# Patient Record
Sex: Female | Born: 1960 | Race: Black or African American | Hispanic: No | Marital: Single | Smoking: Current some day smoker
Health system: Southern US, Community
[De-identification: ages and names within clinical notes are randomized; demographics above are authoritative.]

## PROBLEM LIST (undated history)

## (undated) DIAGNOSIS — F250 Schizoaffective disorder, bipolar type: Secondary | ICD-10-CM

## (undated) DIAGNOSIS — F1490 Cocaine use, unspecified, uncomplicated: Secondary | ICD-10-CM

## (undated) DIAGNOSIS — J45909 Unspecified asthma, uncomplicated: Secondary | ICD-10-CM

## (undated) DIAGNOSIS — F1010 Alcohol abuse, uncomplicated: Secondary | ICD-10-CM

## (undated) DIAGNOSIS — E119 Type 2 diabetes mellitus without complications: Secondary | ICD-10-CM

## (undated) DIAGNOSIS — F319 Bipolar disorder, unspecified: Secondary | ICD-10-CM

## (undated) DIAGNOSIS — F1910 Other psychoactive substance abuse, uncomplicated: Secondary | ICD-10-CM

## (undated) DIAGNOSIS — M1990 Unspecified osteoarthritis, unspecified site: Secondary | ICD-10-CM

## (undated) DIAGNOSIS — I2510 Atherosclerotic heart disease of native coronary artery without angina pectoris: Secondary | ICD-10-CM

## (undated) DIAGNOSIS — I10 Essential (primary) hypertension: Secondary | ICD-10-CM

## (undated) HISTORY — PX: OTHER SURGICAL HISTORY: SHX169

## (undated) HISTORY — PX: CORONARY STENT PLACEMENT: SHX1402

---

## 2007-09-04 ENCOUNTER — Ambulatory Visit: Payer: Self-pay | Admitting: Family Medicine

## 2007-09-04 LAB — CONVERTED CEMR LAB: Microalb, Ur: 1.19 mg/dL (ref 0.00–1.89)

## 2008-01-23 ENCOUNTER — Ambulatory Visit: Payer: Self-pay | Admitting: Family Medicine

## 2009-02-08 ENCOUNTER — Emergency Department (HOSPITAL_BASED_OUTPATIENT_CLINIC_OR_DEPARTMENT_OTHER): Admission: EM | Admit: 2009-02-08 | Discharge: 2009-02-08 | Payer: Self-pay | Admitting: Emergency Medicine

## 2009-02-09 ENCOUNTER — Emergency Department (HOSPITAL_COMMUNITY): Admission: EM | Admit: 2009-02-09 | Discharge: 2009-02-09 | Payer: Self-pay | Admitting: Emergency Medicine

## 2009-02-14 ENCOUNTER — Ambulatory Visit: Payer: Self-pay | Admitting: Interventional Radiology

## 2009-02-14 ENCOUNTER — Emergency Department (HOSPITAL_BASED_OUTPATIENT_CLINIC_OR_DEPARTMENT_OTHER): Admission: EM | Admit: 2009-02-14 | Discharge: 2009-02-14 | Payer: Self-pay | Admitting: Emergency Medicine

## 2010-04-11 ENCOUNTER — Emergency Department (HOSPITAL_COMMUNITY): Admission: EM | Admit: 2010-04-11 | Discharge: 2010-04-12 | Payer: Self-pay | Admitting: Emergency Medicine

## 2010-04-15 ENCOUNTER — Emergency Department (HOSPITAL_BASED_OUTPATIENT_CLINIC_OR_DEPARTMENT_OTHER): Admission: EM | Admit: 2010-04-15 | Discharge: 2010-04-15 | Payer: Self-pay | Admitting: Emergency Medicine

## 2010-04-20 ENCOUNTER — Emergency Department (HOSPITAL_BASED_OUTPATIENT_CLINIC_OR_DEPARTMENT_OTHER): Admission: EM | Admit: 2010-04-20 | Discharge: 2010-04-20 | Payer: Self-pay | Admitting: Emergency Medicine

## 2010-05-05 ENCOUNTER — Ambulatory Visit: Payer: Self-pay | Admitting: Psychiatry

## 2010-05-06 ENCOUNTER — Ambulatory Visit: Payer: Self-pay | Admitting: Psychiatry

## 2011-02-13 LAB — DIFFERENTIAL
Basophils Absolute: 0 10*3/uL (ref 0.0–0.1)
Basophils Relative: 1 % (ref 0–1)
Eosinophils Absolute: 0.1 10*3/uL (ref 0.0–0.7)
Eosinophils Relative: 1 % (ref 0–5)
Eosinophils Relative: 1 % (ref 0–5)
Lymphocytes Relative: 40 % (ref 12–46)
Lymphs Abs: 2.9 10*3/uL (ref 0.7–4.0)
Monocytes Absolute: 0.5 10*3/uL (ref 0.1–1.0)
Monocytes Relative: 4 % (ref 3–12)
Neutro Abs: 3.7 10*3/uL (ref 1.7–7.7)
Neutrophils Relative %: 51 % (ref 43–77)

## 2011-02-13 LAB — BASIC METABOLIC PANEL
BUN: 15 mg/dL (ref 6–23)
BUN: 17 mg/dL (ref 6–23)
CO2: 23 mEq/L (ref 19–32)
CO2: 26 mEq/L (ref 19–32)
Calcium: 9.7 mg/dL (ref 8.4–10.5)
Chloride: 102 mEq/L (ref 96–112)
Chloride: 98 mEq/L (ref 96–112)
Creatinine, Ser: 0.8 mg/dL (ref 0.4–1.2)
GFR calc Af Amer: >60 mL/min (ref >60)
GFR calc non Af Amer: >60 mL/min (ref >60)
Glucose, Bld: 372 mg/dL — ABNORMAL HIGH (ref 70–99)
Glucose, Bld: 373 mg/dL — ABNORMAL HIGH (ref 70–99)
Potassium: 4.3 mEq/L (ref 3.5–5.1)
Potassium: 4.3 mEq/L (ref 3.5–5.1)
Sodium: 132 mEq/L — ABNORMAL LOW (ref 135–145)

## 2011-02-13 LAB — CBC
HCT: 36.2 % (ref 36.0–46.0)
Hemoglobin: 11.9 g/dL — ABNORMAL LOW (ref 12.0–15.0)
MCHC: 32.8 g/dL (ref 30.0–36.0)
MCV: 89.7 fL (ref 78.0–100.0)
Platelets: 294 10*3/uL (ref 150–400)
RBC: 4.03 MIL/uL (ref 3.87–5.11)
RDW: 13.1 % (ref 11.5–15.5)
WBC: 7.3 10*3/uL (ref 4.0–10.5)

## 2011-02-13 LAB — GLUCOSE, CAPILLARY
Glucose-Capillary: 270 mg/dL — ABNORMAL HIGH (ref 70–99)
Glucose-Capillary: 338 mg/dL — ABNORMAL HIGH (ref 70–99)
Glucose-Capillary: 348 mg/dL — ABNORMAL HIGH (ref 70–99)
Glucose-Capillary: 192 mg/dL — ABNORMAL HIGH (ref 70–99)
Glucose-Capillary: 266 mg/dL — ABNORMAL HIGH (ref 70–99)
Glucose-Capillary: 305 mg/dL — ABNORMAL HIGH (ref 70–99)
Glucose-Capillary: 314 mg/dL — ABNORMAL HIGH (ref 70–99)
Glucose-Capillary: 475 mg/dL — ABNORMAL HIGH (ref 70–99)

## 2011-02-13 LAB — URINALYSIS, ROUTINE W REFLEX MICROSCOPIC
Bilirubin Urine: NEGATIVE
Glucose, UA: >1000 mg/dL — AB
Glucose, UA: 500 mg/dL — AB
Hgb urine dipstick: NEGATIVE
Ketones, ur: NEGATIVE mg/dL
Ketones, ur: NEGATIVE mg/dL
Leukocytes, UA: NEGATIVE
Nitrite: NEGATIVE
Protein, ur: NEGATIVE mg/dL
Specific Gravity, Urine: 1.03 (ref 1.005–1.030)
pH: 7 (ref 5.0–8.0)

## 2011-02-13 LAB — ACETAMINOPHEN LEVEL: Acetaminophen (Tylenol), Serum: <10 ug/mL — ABNORMAL LOW (ref 10–30)

## 2011-02-13 LAB — URINE MICROSCOPIC-ADD ON

## 2011-02-13 LAB — RAPID URINE DRUG SCREEN, HOSP PERFORMED
Barbiturates: NOT DETECTED
Cocaine: POSITIVE — AB
Opiates: NOT DETECTED

## 2011-03-09 LAB — PREGNANCY, URINE: Preg Test, Ur: NEGATIVE

## 2011-03-09 LAB — POCT CARDIAC MARKERS: Myoglobin, poc: 79 ng/mL (ref 12–200)

## 2011-03-09 LAB — POCT I-STAT, CHEM 8
BUN: 13 mg/dL (ref 6–23)
Calcium, Ion: 1.12 mmol/L (ref 1.12–1.32)
HCT: 41 % (ref 39.0–52.0)
Hemoglobin: 13.9 g/dL (ref 13.0–17.0)
Sodium: 135 mEq/L (ref 135–145)
TCO2: 26 mmol/L (ref 0–100)

## 2011-03-09 LAB — GLUCOSE, CAPILLARY: Glucose-Capillary: 146 mg/dL — ABNORMAL HIGH (ref 70–99)

## 2011-03-09 LAB — DIFFERENTIAL
Eosinophils Absolute: 0.3 10*3/uL (ref 0.0–0.7)
Eosinophils Relative: 2 % (ref 0–5)
Lymphocytes Relative: 38 % (ref 12–46)
Lymphs Abs: 4.6 10*3/uL — ABNORMAL HIGH (ref 0.7–4.0)
Monocytes Absolute: 0.8 10*3/uL (ref 0.1–1.0)
Monocytes Relative: 7 % (ref 3–12)

## 2011-03-09 LAB — BASIC METABOLIC PANEL
BUN: 17 mg/dL (ref 6–23)
Chloride: 100 mEq/L (ref 96–112)
GFR calc non Af Amer: >60 mL/min (ref >60)
Glucose, Bld: 194 mg/dL — ABNORMAL HIGH (ref 70–99)
Potassium: 4.3 mEq/L (ref 3.5–5.1)
Sodium: 135 mEq/L (ref 135–145)

## 2011-03-09 LAB — URINALYSIS, ROUTINE W REFLEX MICROSCOPIC
Bilirubin Urine: NEGATIVE
Ketones, ur: NEGATIVE mg/dL
Nitrite: NEGATIVE
Protein, ur: NEGATIVE mg/dL

## 2011-03-09 LAB — GC/CHLAMYDIA PROBE AMP, GENITAL: GC Probe Amp, Genital: NEGATIVE

## 2011-03-09 LAB — CBC
HCT: 34 % — ABNORMAL LOW (ref 36.0–46.0)
Hemoglobin: 11.4 g/dL — ABNORMAL LOW (ref 12.0–15.0)
MCV: 85.4 fL (ref 78.0–100.0)
Platelets: 258 10*3/uL (ref 150–400)
WBC: 12.1 10*3/uL — ABNORMAL HIGH (ref 4.0–10.5)

## 2011-03-09 LAB — WET PREP, GENITAL
Clue Cells Wet Prep HPF POC: NONE SEEN
Yeast Wet Prep HPF POC: NONE SEEN

## 2011-03-09 LAB — URINE MICROSCOPIC-ADD ON

## 2011-03-23 ENCOUNTER — Emergency Department (HOSPITAL_COMMUNITY)
Admission: EM | Admit: 2011-03-23 | Discharge: 2011-03-24 | Disposition: A | Discharge status: Home / Self Care | Payer: Medicaid Other | Attending: Emergency Medicine | Admitting: Emergency Medicine

## 2011-03-23 DIAGNOSIS — F1410 Cocaine abuse, uncomplicated: Secondary | ICD-10-CM | POA: Insufficient documentation

## 2011-03-23 DIAGNOSIS — Z79899 Other long term (current) drug therapy: Secondary | ICD-10-CM | POA: Insufficient documentation

## 2011-03-23 DIAGNOSIS — E119 Type 2 diabetes mellitus without complications: Secondary | ICD-10-CM | POA: Insufficient documentation

## 2011-03-23 DIAGNOSIS — I10 Essential (primary) hypertension: Secondary | ICD-10-CM | POA: Insufficient documentation

## 2011-03-23 DIAGNOSIS — F1010 Alcohol abuse, uncomplicated: Secondary | ICD-10-CM | POA: Insufficient documentation

## 2011-03-23 DIAGNOSIS — Z794 Long term (current) use of insulin: Secondary | ICD-10-CM | POA: Insufficient documentation

## 2011-03-23 LAB — COMPREHENSIVE METABOLIC PANEL
Albumin: 3.5 g/dL (ref 3.5–5.2)
BUN: 13 mg/dL (ref 6–23)
CO2: 24 mEq/L (ref 19–32)
Calcium: 9.2 mg/dL (ref 8.4–10.5)
Chloride: 100 mEq/L (ref 96–112)
Creatinine, Ser: 0.85 mg/dL (ref 0.4–1.2)
GFR calc non Af Amer: >60 mL/min (ref >60)
Total Bilirubin: 0.3 mg/dL (ref 0.3–1.2)

## 2011-03-23 LAB — CBC
MCH: 28 pg (ref 26.0–34.0)
MCHC: 33.7 g/dL (ref 30.0–36.0)
MCV: 83.1 fL (ref 78.0–100.0)
Platelets: 258 10*3/uL (ref 150–400)
RBC: 4.39 MIL/uL (ref 3.87–5.11)

## 2011-03-23 LAB — RAPID URINE DRUG SCREEN, HOSP PERFORMED
Barbiturates: NOT DETECTED
Cocaine: POSITIVE — AB
Opiates: NOT DETECTED

## 2011-03-23 LAB — DIFFERENTIAL
Basophils Relative: 0 % (ref 0–1)
Eosinophils Absolute: 0.1 10*3/uL (ref 0.0–0.7)
Lymphs Abs: 2.9 10*3/uL (ref 0.7–4.0)
Monocytes Absolute: 0.6 10*3/uL (ref 0.1–1.0)
Monocytes Relative: 7 % (ref 3–12)
Neutrophils Relative %: 58 % (ref 43–77)

## 2011-03-23 LAB — GLUCOSE, CAPILLARY

## 2011-03-24 LAB — GLUCOSE, CAPILLARY

## 2013-01-13 ENCOUNTER — Emergency Department (HOSPITAL_BASED_OUTPATIENT_CLINIC_OR_DEPARTMENT_OTHER)
Admission: EM | Admit: 2013-01-13 | Discharge: 2013-01-13 | Disposition: A | Discharge status: Home / Self Care | Payer: Medicaid Other | Attending: Emergency Medicine | Admitting: Emergency Medicine

## 2013-01-13 ENCOUNTER — Encounter (HOSPITAL_BASED_OUTPATIENT_CLINIC_OR_DEPARTMENT_OTHER): Payer: Self-pay | Admitting: *Deleted

## 2013-01-13 DIAGNOSIS — J45909 Unspecified asthma, uncomplicated: Secondary | ICD-10-CM | POA: Insufficient documentation

## 2013-01-13 DIAGNOSIS — Z794 Long term (current) use of insulin: Secondary | ICD-10-CM | POA: Insufficient documentation

## 2013-01-13 DIAGNOSIS — I10 Essential (primary) hypertension: Secondary | ICD-10-CM | POA: Insufficient documentation

## 2013-01-13 DIAGNOSIS — Z79899 Other long term (current) drug therapy: Secondary | ICD-10-CM | POA: Insufficient documentation

## 2013-01-13 DIAGNOSIS — E119 Type 2 diabetes mellitus without complications: Secondary | ICD-10-CM | POA: Insufficient documentation

## 2013-01-13 DIAGNOSIS — K0889 Other specified disorders of teeth and supporting structures: Secondary | ICD-10-CM

## 2013-01-13 DIAGNOSIS — F17200 Nicotine dependence, unspecified, uncomplicated: Secondary | ICD-10-CM | POA: Insufficient documentation

## 2013-01-13 DIAGNOSIS — K089 Disorder of teeth and supporting structures, unspecified: Secondary | ICD-10-CM | POA: Insufficient documentation

## 2013-01-13 HISTORY — DX: Unspecified asthma, uncomplicated: J45.909

## 2013-01-13 HISTORY — DX: Essential (primary) hypertension: I10

## 2013-01-13 HISTORY — DX: Type 2 diabetes mellitus without complications: E11.9

## 2013-01-13 MED ORDER — AMOXICILLIN 500 MG PO CAPS: 500.0000 mg | ORAL_CAPSULE | Freq: Three times a day (TID) | ORAL | Status: DC

## 2013-01-13 NOTE — ED Notes (Signed)
Dental pain has 2 teeth bothering her states she had appt for extraction 2 weeks ago but went to [facility] for rehab and has been there for 14 days.

## 2013-01-13 NOTE — ED Provider Notes (Signed)
History     CSN: [id number]  Arrival date & time 01/13/13  4098   First MD Initiated Contact with Patient 01/13/13 0840      Chief Complaint  Patient presents with  . Dental Pain    Patient is a 52 y.o. female presenting with tooth pain. The history is provided by the patient.  Dental PainThe primary symptoms include mouth pain. Primary symptoms do not include fever. Episode onset: several days ago. The symptoms are worsening. The symptoms are recurrent. The symptoms occur constantly.    Past Medical History  Diagnosis Date  . Diabetes mellitus without complication   . Hypertension   . Asthma     History reviewed. No pertinent past surgical history.  History reviewed. No pertinent family history.  History  Substance Use Topics  . Smoking status: Current Some Day Smoker  . Smokeless tobacco: Not on file  . Alcohol Use: No     Comment: 14 days in rehab at [facility]    OB History   Grav Para Term Preterm Abortions TAB SAB Ect Mult Living                  Review of Systems  Constitutional: Negative for fever.  Gastrointestinal: Negative for vomiting.    Allergies  Aspirin  Home Medications   Current Outpatient Rx  Name  Route  Sig  Dispense  Refill  . albuterol (PROVENTIL) (2.5 MG/3ML) 0.083% nebulizer solution   Nebulization   Take 2.5 mg by nebulization every 6 (six) hours as needed for wheezing.         . insulin aspart (NOVOLOG) 100 UNIT/ML injection   Subcutaneous   Inject into the skin 3 (three) times daily before meals.         . metFORMIN (GLUCOPHAGE) 1000 MG tablet   Oral   Take 1,000 mg by mouth 2 (two) times daily with a meal.         . omeprazole (PRILOSEC) 40 MG capsule   Oral   Take 40 mg by mouth daily.         . simvastatin (ZOCOR) 40 MG tablet   Oral   Take 40 mg by mouth every evening.         Marland Kitchen amoxicillin (AMOXIL) 500 MG capsule   Oral   Take 1 capsule (500 mg total) by mouth 3 (three) times daily.   21 capsule    0     BP 145/97  Pulse 81  Temp(Src) 97.9 F (36.6 C) (Oral)  Resp 16  Ht 5\' 5"  (1.651 m)  Wt 140 lb (63.504 kg)  BMI 23.3 kg/m2  SpO2 100%  Physical Exam CONSTITUTIONAL: Well developed/well nourished HEAD AND FACE: Normocephalic/atraumatic EYES: EOMI ENMT: Mucous membranes moist.  Poor dentition.  No trismus.  No focal abscess noted. NECK: supple no meningeal signs CV: S1/S2 noted, no murmurs/rubs/gallops noted LUNGS: Lungs are clear to auscultation bilaterally, no apparent distress ABDOMEN: soft, nontender, no rebound or guarding NEURO: Pt is awake/alert, moves all extremitiesx4 EXTREMITIES:full ROM SKIN: warm, color normal  ED Course  Procedures   1. Pain, dental       MDM  Nursing notes including past medical history and social history reviewed and considered in documentation         Joya Gaskins, MD 01/13/13 [phone]

## 2013-08-25 ENCOUNTER — Encounter (HOSPITAL_COMMUNITY): Payer: Self-pay | Admitting: Emergency Medicine

## 2013-08-25 ENCOUNTER — Emergency Department (HOSPITAL_COMMUNITY)
Admission: EM | Admit: 2013-08-25 | Discharge: 2013-08-26 | Disposition: A | Discharge status: Other Acute Inpatient Hospital | Payer: Medicaid Other | Attending: Emergency Medicine | Admitting: Emergency Medicine

## 2013-08-25 DIAGNOSIS — F3130 Bipolar disorder, current episode depressed, mild or moderate severity, unspecified: Secondary | ICD-10-CM | POA: Insufficient documentation

## 2013-08-25 DIAGNOSIS — Z0289 Encounter for other administrative examinations: Secondary | ICD-10-CM | POA: Insufficient documentation

## 2013-08-25 DIAGNOSIS — Z79899 Other long term (current) drug therapy: Secondary | ICD-10-CM | POA: Insufficient documentation

## 2013-08-25 DIAGNOSIS — F17200 Nicotine dependence, unspecified, uncomplicated: Secondary | ICD-10-CM | POA: Insufficient documentation

## 2013-08-25 DIAGNOSIS — R45851 Suicidal ideations: Secondary | ICD-10-CM | POA: Insufficient documentation

## 2013-08-25 DIAGNOSIS — F1910 Other psychoactive substance abuse, uncomplicated: Secondary | ICD-10-CM | POA: Insufficient documentation

## 2013-08-25 DIAGNOSIS — Z9119 Patient's noncompliance with other medical treatment and regimen: Secondary | ICD-10-CM | POA: Insufficient documentation

## 2013-08-25 DIAGNOSIS — M129 Arthropathy, unspecified: Secondary | ICD-10-CM | POA: Insufficient documentation

## 2013-08-25 DIAGNOSIS — F3289 Other specified depressive episodes: Secondary | ICD-10-CM | POA: Insufficient documentation

## 2013-08-25 DIAGNOSIS — I10 Essential (primary) hypertension: Secondary | ICD-10-CM | POA: Insufficient documentation

## 2013-08-25 DIAGNOSIS — R51 Headache: Secondary | ICD-10-CM | POA: Insufficient documentation

## 2013-08-25 DIAGNOSIS — E876 Hypokalemia: Secondary | ICD-10-CM | POA: Insufficient documentation

## 2013-08-25 DIAGNOSIS — E119 Type 2 diabetes mellitus without complications: Secondary | ICD-10-CM | POA: Insufficient documentation

## 2013-08-25 DIAGNOSIS — Z91199 Patient's noncompliance with other medical treatment and regimen due to unspecified reason: Secondary | ICD-10-CM | POA: Insufficient documentation

## 2013-08-25 DIAGNOSIS — F329 Major depressive disorder, single episode, unspecified: Secondary | ICD-10-CM | POA: Insufficient documentation

## 2013-08-25 DIAGNOSIS — J45909 Unspecified asthma, uncomplicated: Secondary | ICD-10-CM | POA: Insufficient documentation

## 2013-08-25 DIAGNOSIS — F1020 Alcohol dependence, uncomplicated: Secondary | ICD-10-CM | POA: Insufficient documentation

## 2013-08-25 DIAGNOSIS — Z794 Long term (current) use of insulin: Secondary | ICD-10-CM | POA: Insufficient documentation

## 2013-08-25 HISTORY — DX: Bipolar disorder, unspecified: F31.9

## 2013-08-25 HISTORY — DX: Unspecified osteoarthritis, unspecified site: M19.90

## 2013-08-25 LAB — COMPREHENSIVE METABOLIC PANEL
Alkaline Phosphatase: 86 U/L (ref 39–117)
BUN: 13 mg/dL (ref 6–23)
CO2: 27 mEq/L (ref 19–32)
GFR calc Af Amer: >90 mL/min (ref >90)
GFR calc non Af Amer: >90 mL/min (ref >90)
Glucose, Bld: 228 mg/dL — ABNORMAL HIGH (ref 70–99)
Potassium: 3.2 mEq/L — ABNORMAL LOW (ref 3.5–5.1)
Total Protein: 7.7 g/dL (ref 6.0–8.3)

## 2013-08-25 LAB — CBC
HCT: 33.8 % — ABNORMAL LOW (ref 36.0–46.0)
Hemoglobin: 11.4 g/dL — ABNORMAL LOW (ref 12.0–15.0)
MCH: 28.4 pg (ref 26.0–34.0)
MCHC: 33.7 g/dL (ref 30.0–36.0)
RBC: 4.02 MIL/uL (ref 3.87–5.11)

## 2013-08-25 LAB — RAPID URINE DRUG SCREEN, HOSP PERFORMED
Barbiturates: NOT DETECTED
Cocaine: POSITIVE — AB
Tetrahydrocannabinol: NOT DETECTED

## 2013-08-25 LAB — GLUCOSE, CAPILLARY
Glucose-Capillary: 187 mg/dL — ABNORMAL HIGH (ref 70–99)
Glucose-Capillary: 204 mg/dL — ABNORMAL HIGH (ref 70–99)

## 2013-08-25 LAB — ETHANOL: Alcohol, Ethyl (B): <11 mg/dL (ref 0–11)

## 2013-08-25 MED ORDER — INSULIN GLARGINE 100 UNIT/ML ~~LOC~~ SOLN
45.0000 [IU] | Freq: Every day | SUBCUTANEOUS | Status: DC
  Administered 2013-08-25: 45 [IU] via SUBCUTANEOUS
  Filled 2013-08-25: qty 0.45

## 2013-08-25 MED ORDER — THIAMINE HCL 100 MG/ML IJ SOLN: 100.0000 mg | Freq: Every day | INTRAMUSCULAR | Status: DC

## 2013-08-25 MED ORDER — LORAZEPAM 1 MG PO TABS: 0.0000 mg | ORAL_TABLET | Freq: Two times a day (BID) | ORAL | Status: DC

## 2013-08-25 MED ORDER — LORAZEPAM 2 MG/ML IJ SOLN: 1.0000 mg | Freq: Four times a day (QID) | INTRAMUSCULAR | Status: DC | PRN

## 2013-08-25 MED ORDER — MELOXICAM 7.5 MG PO TABS
7.5000 mg | ORAL_TABLET | Freq: Every day | ORAL | Status: DC
  Administered 2013-08-25: 7.5 mg via ORAL
  Filled 2013-08-25 (×2): qty 1

## 2013-08-25 MED ORDER — METFORMIN HCL 500 MG PO TABS
1000.0000 mg | ORAL_TABLET | Freq: Two times a day (BID) | ORAL | Status: DC
  Filled 2013-08-25 (×3): qty 2

## 2013-08-25 MED ORDER — VITAMIN B-1 100 MG PO TABS
100.0000 mg | ORAL_TABLET | Freq: Every day | ORAL | Status: DC
  Administered 2013-08-25: 100 mg via ORAL
  Filled 2013-08-25: qty 1

## 2013-08-25 MED ORDER — HALOPERIDOL 1 MG PO TABS
0.5000 mg | ORAL_TABLET | Freq: Every day | ORAL | Status: DC
  Administered 2013-08-25: 0.5 mg via ORAL
  Filled 2013-08-25: qty 1

## 2013-08-25 MED ORDER — PANTOPRAZOLE SODIUM 40 MG PO TBEC
80.0000 mg | DELAYED_RELEASE_TABLET | Freq: Every day | ORAL | Status: DC
  Administered 2013-08-25: 80 mg via ORAL
  Filled 2013-08-25: qty 2

## 2013-08-25 MED ORDER — SIMVASTATIN 40 MG PO TABS
40.0000 mg | ORAL_TABLET | Freq: Every evening | ORAL | Status: DC
  Administered 2013-08-25: 40 mg via ORAL
  Filled 2013-08-25 (×2): qty 1

## 2013-08-25 MED ORDER — ALBUTEROL SULFATE (5 MG/ML) 0.5% IN NEBU: 2.5000 mg | INHALATION_SOLUTION | RESPIRATORY_TRACT | Status: DC | PRN

## 2013-08-25 MED ORDER — INSULIN ASPART 100 UNIT/ML ~~LOC~~ SOLN: 0.0000 [IU] | Freq: Three times a day (TID) | SUBCUTANEOUS | Status: DC

## 2013-08-25 MED ORDER — FOLIC ACID 1 MG PO TABS
1.0000 mg | ORAL_TABLET | Freq: Every day | ORAL | Status: DC
  Administered 2013-08-25: 1 mg via ORAL
  Filled 2013-08-25: qty 1

## 2013-08-25 MED ORDER — INSULIN ASPART 100 UNIT/ML ~~LOC~~ SOLN
4.0000 [IU] | Freq: Every day | SUBCUTANEOUS | Status: DC
  Administered 2013-08-25: 5 [IU] via SUBCUTANEOUS
  Filled 2013-08-25: qty 1

## 2013-08-25 MED ORDER — LORAZEPAM 1 MG PO TABS: 1.0000 mg | ORAL_TABLET | Freq: Four times a day (QID) | ORAL | Status: DC | PRN

## 2013-08-25 MED ORDER — LORAZEPAM 1 MG PO TABS
0.0000 mg | ORAL_TABLET | Freq: Four times a day (QID) | ORAL | Status: DC
  Administered 2013-08-25: 1 mg via ORAL
  Filled 2013-08-25: qty 1

## 2013-08-25 MED ORDER — POTASSIUM CHLORIDE CRYS ER 20 MEQ PO TBCR
40.0000 meq | EXTENDED_RELEASE_TABLET | Freq: Once | ORAL | Status: AC
  Administered 2013-08-25: 40 meq via ORAL
  Filled 2013-08-25: qty 2

## 2013-08-25 MED ORDER — ADULT MULTIVITAMIN W/MINERALS CH
1.0000 | ORAL_TABLET | Freq: Every day | ORAL | Status: DC
  Administered 2013-08-25: 1 via ORAL
  Filled 2013-08-25: qty 1

## 2013-08-25 NOTE — ED Provider Notes (Signed)
CSN: [id number]     Arrival date & time 08/25/13  4010 History   None    Chief Complaint  Patient presents with  . Medical Clearance   (Consider location/radiation/quality/duration/timing/severity/associated sxs/prior Treatment) HPI Comments: Patient presents to the ER for evaluation of fashion and help with drinking problem. Patient reports a history of alcoholism. She had stopped drinking for some time, but 2 weeks ago started "hanging with the wrong crowd" and started binge drinking. Patient reports that this has made her more depressed. She has had thoughts of suicide or last 2 weeks. She does not currently have a plan. She reports that she has not been taking any of her medications during this binge.   Past Medical History  Diagnosis Date  . Diabetes mellitus without complication   . Hypertension   . Asthma   . Arthritis   . Bipolar 1 disorder, depressed    Past Surgical History  Procedure Laterality Date  . C section     History reviewed. No pertinent family history. History  Substance Use Topics  . Smoking status: Current Some Day Smoker    Types: Cigarettes  . Smokeless tobacco: Not on file  . Alcohol Use: 9.0 oz/week    15 Cans of beer per week     Comment: "2 weeks of biege drinking"    OB History   Grav Para Term Preterm Abortions TAB SAB Ect Mult Living                 Review of Systems  Neurological: Positive for headaches.  Psychiatric/Behavioral: Positive for suicidal ideas and dysphoric mood.  All other systems reviewed and are negative.    Allergies  Aspirin  Home Medications   Current Outpatient Rx  Name  Route  Sig  Dispense  Refill  . FLUoxetine HCl (PROZAC PO)   Oral   Take 1 tablet by mouth daily.         . haloperidol (HALDOL) 0.5 MG tablet   Oral   Take 0.5 mg by mouth daily.         . insulin aspart (NOVOLOG) 100 UNIT/ML injection   Subcutaneous   Inject 4-15 Units into the skin daily.          . insulin glargine (LANTUS)  100 UNIT/ML injection   Subcutaneous   Inject 45 Units into the skin at bedtime.         . meloxicam (MOBIC) 7.5 MG tablet   Oral   Take 7.5 mg by mouth daily.         . metFORMIN (GLUCOPHAGE) 1000 MG tablet   Oral   Take 1,000 mg by mouth 2 (two) times daily with a meal.         . omeprazole (PRILOSEC) 40 MG capsule   Oral   Take 40 mg by mouth daily.         . simvastatin (ZOCOR) 40 MG tablet   Oral   Take 40 mg by mouth every evening.         Marland Kitchen albuterol (PROVENTIL) (2.5 MG/3ML) 0.083% nebulizer solution   Nebulization   Take 2.5 mg by nebulization every 6 (six) hours as needed for wheezing.          BP 123/74  Pulse 86  Temp(Src) 98.2 F (36.8 C) (Oral)  Resp 18  Ht 5\' 5"  (1.651 m)  Wt 135 lb (61.236 kg)  BMI 22.47 kg/m2  SpO2 100%  LMP 06/30/2013 Physical Exam  Constitutional: She  is oriented to person, place, and time. She appears well-developed and well-nourished. No distress.  HENT:  Head: Normocephalic and atraumatic.  Right Ear: Hearing normal.  Left Ear: Hearing normal.  Nose: Nose normal.  Mouth/Throat: Oropharynx is clear and moist and mucous membranes are normal.  Eyes: Conjunctivae and EOM are normal. Pupils are equal, round, and reactive to light.  Neck: Normal range of motion. Neck supple.  Cardiovascular: Regular rhythm, S1 normal and S2 normal.  Exam reveals no gallop and no friction rub.   No murmur heard. Pulmonary/Chest: Effort normal and breath sounds normal. No respiratory distress. She exhibits no tenderness.  Abdominal: Soft. Normal appearance and bowel sounds are normal. There is no hepatosplenomegaly. There is no tenderness. There is no rebound, no guarding, no tenderness at McBurney's point and negative Murphy's sign. No hernia.  Musculoskeletal: Normal range of motion.  Neurological: She is alert and oriented to person, place, and time. She has normal strength. No cranial nerve deficit or sensory deficit. Coordination normal.  GCS eye subscore is 4. GCS verbal subscore is 5. GCS motor subscore is 6.  Skin: Skin is warm, dry and intact. No rash noted. No cyanosis.  Psychiatric: Her speech is normal and behavior is normal. Thought content normal. She exhibits a depressed mood.    ED Course  Procedures (including critical care time) Labs Review Labs Reviewed  CBC - Abnormal; Notable for the following:    WBC 11.4 (*)    Hemoglobin 11.4 (*)    HCT 33.8 (*)    All other components within normal limits  COMPREHENSIVE METABOLIC PANEL - Abnormal; Notable for the following:    Sodium 134 (*)    Potassium 3.2 (*)    Glucose, Bld 228 (*)    All other components within normal limits  SALICYLATE LEVEL - Abnormal; Notable for the following:    Salicylate Lvl <2.0 (*)    All other components within normal limits  URINE RAPID DRUG SCREEN (HOSP PERFORMED) - Abnormal; Notable for the following:    Cocaine POSITIVE (*)    All other components within normal limits  ACETAMINOPHEN LEVEL  ETHANOL   Imaging Review No results found.  MDM  Diagnosis: 1. Alcoholic history 2.Depression 3. Hypokalemia  She presents to the ER for evaluation of ongoing abuse as well as depression. She has a history of chronic alcoholism, has been binge drinking for 2 weeks. During this period of time, patient has also suffered depression and has had thoughts of suicide. Workup revealed slight hypokalemia and slight hyperglycemia. She is a diabetic and has been off of her medications. Patient administered potassium and will require glucose control. Workup is complete, she is medically clear for psychiatric treatment.    Gilda Crease, MD 08/25/13 1247

## 2013-08-25 NOTE — ED Notes (Signed)
Per pt, "started beige drinking 2 weeks ago" Pt states that she "feels depressed and stressed; like no one is there." Pt states last drink was at 0700 this morning. Pt states " I've had a lot; wine, beer, and liquor; have a really bad headache." Pt request detox. Pt denies SI/HI at present time.

## 2013-08-25 NOTE — ED Notes (Signed)
2 bags of pt belongings put in locker 33

## 2013-08-26 ENCOUNTER — Encounter (HOSPITAL_COMMUNITY): Payer: Self-pay | Admitting: *Deleted

## 2013-08-26 ENCOUNTER — Inpatient Hospital Stay (HOSPITAL_COMMUNITY)
Admission: EM | Admit: 2013-08-26 | Discharge: 2013-08-29 | DRG: 897 | Disposition: A | Discharge status: Home / Self Care | Payer: Medicaid Other | Source: Intra-hospital | Attending: Psychiatry | Admitting: Psychiatry

## 2013-08-26 DIAGNOSIS — F1020 Alcohol dependence, uncomplicated: Principal | ICD-10-CM | POA: Diagnosis present

## 2013-08-26 DIAGNOSIS — Z79899 Other long term (current) drug therapy: Secondary | ICD-10-CM

## 2013-08-26 DIAGNOSIS — I10 Essential (primary) hypertension: Secondary | ICD-10-CM | POA: Diagnosis present

## 2013-08-26 DIAGNOSIS — F1420 Cocaine dependence, uncomplicated: Secondary | ICD-10-CM | POA: Diagnosis present

## 2013-08-26 DIAGNOSIS — F1910 Other psychoactive substance abuse, uncomplicated: Secondary | ICD-10-CM

## 2013-08-26 DIAGNOSIS — J45909 Unspecified asthma, uncomplicated: Secondary | ICD-10-CM | POA: Diagnosis present

## 2013-08-26 DIAGNOSIS — F323 Major depressive disorder, single episode, severe with psychotic features: Secondary | ICD-10-CM | POA: Diagnosis present

## 2013-08-26 DIAGNOSIS — F1994 Other psychoactive substance use, unspecified with psychoactive substance-induced mood disorder: Secondary | ICD-10-CM

## 2013-08-26 DIAGNOSIS — E119 Type 2 diabetes mellitus without complications: Secondary | ICD-10-CM | POA: Diagnosis present

## 2013-08-26 DIAGNOSIS — R45851 Suicidal ideations: Secondary | ICD-10-CM

## 2013-08-26 LAB — COMPREHENSIVE METABOLIC PANEL
ALT: 15 U/L (ref 0–35)
AST: 13 U/L (ref 0–37)
Albumin: 3.3 g/dL — ABNORMAL LOW (ref 3.5–5.2)
CO2: 24 mEq/L (ref 19–32)
Calcium: 9.1 mg/dL (ref 8.4–10.5)
Sodium: 137 mEq/L (ref 135–145)
Total Protein: 6.9 g/dL (ref 6.0–8.3)

## 2013-08-26 LAB — GLUCOSE, CAPILLARY: Glucose-Capillary: 194 mg/dL — ABNORMAL HIGH (ref 70–99)

## 2013-08-26 MED ORDER — ADULT MULTIVITAMIN W/MINERALS CH
1.0000 | ORAL_TABLET | Freq: Every day | ORAL | Status: DC
  Administered 2013-08-26 – 2013-08-29 (×4): 1 via ORAL
  Filled 2013-08-26 (×5): qty 1

## 2013-08-26 MED ORDER — FLUOXETINE HCL 10 MG PO CAPS
10.0000 mg | ORAL_CAPSULE | Freq: Every day | ORAL | Status: DC
  Administered 2013-08-26 – 2013-08-29 (×4): 10 mg via ORAL
  Filled 2013-08-26 (×6): qty 1

## 2013-08-26 MED ORDER — TRAZODONE HCL 50 MG PO TABS: 50.0000 mg | ORAL_TABLET | Freq: Every evening | ORAL | Status: DC | PRN

## 2013-08-26 MED ORDER — INSULIN ASPART 100 UNIT/ML ~~LOC~~ SOLN: 4.0000 [IU] | Freq: Every day | SUBCUTANEOUS | Status: DC

## 2013-08-26 MED ORDER — CHLORDIAZEPOXIDE HCL 25 MG PO CAPS
25.0000 mg | ORAL_CAPSULE | ORAL | Status: AC
  Administered 2013-08-28: 25 mg via ORAL
  Filled 2013-08-26: qty 1

## 2013-08-26 MED ORDER — NICOTINE 21 MG/24HR TD PT24
21.0000 mg | MEDICATED_PATCH | Freq: Every day | TRANSDERMAL | Status: DC
  Administered 2013-08-27 – 2013-08-29 (×3): 21 mg via TRANSDERMAL
  Filled 2013-08-26 (×5): qty 1

## 2013-08-26 MED ORDER — INSULIN GLARGINE 100 UNIT/ML ~~LOC~~ SOLN
45.0000 [IU] | Freq: Every day | SUBCUTANEOUS | Status: DC
  Administered 2013-08-26 – 2013-08-27 (×2): 45 [IU] via SUBCUTANEOUS

## 2013-08-26 MED ORDER — VITAMIN B-1 100 MG PO TABS
100.0000 mg | ORAL_TABLET | Freq: Every day | ORAL | Status: DC
  Administered 2013-08-27 – 2013-08-29 (×3): 100 mg via ORAL
  Filled 2013-08-26 (×4): qty 1

## 2013-08-26 MED ORDER — HALOPERIDOL 0.5 MG PO TABS
0.5000 mg | ORAL_TABLET | Freq: Every day | ORAL | Status: DC
  Administered 2013-08-26 – 2013-08-29 (×4): 0.5 mg via ORAL
  Filled 2013-08-26 (×6): qty 1

## 2013-08-26 MED ORDER — CHLORDIAZEPOXIDE HCL 25 MG PO CAPS
25.0000 mg | ORAL_CAPSULE | Freq: Four times a day (QID) | ORAL | Status: AC
  Administered 2013-08-26 (×4): 25 mg via ORAL
  Filled 2013-08-26 (×4): qty 1

## 2013-08-26 MED ORDER — LOPERAMIDE HCL 2 MG PO CAPS: 2.0000 mg | ORAL_CAPSULE | ORAL | Status: AC | PRN

## 2013-08-26 MED ORDER — INSULIN ASPART 100 UNIT/ML ~~LOC~~ SOLN
0.0000 [IU] | Freq: Three times a day (TID) | SUBCUTANEOUS | Status: DC
  Administered 2013-08-26: 3 [IU] via SUBCUTANEOUS
  Administered 2013-08-26: 8 [IU] via SUBCUTANEOUS
  Administered 2013-08-27: 3 [IU] via SUBCUTANEOUS
  Administered 2013-08-27: 2 [IU] via SUBCUTANEOUS
  Administered 2013-08-27: 5 [IU] via SUBCUTANEOUS
  Administered 2013-08-28: 3 [IU] via SUBCUTANEOUS
  Administered 2013-08-28: 2 [IU] via SUBCUTANEOUS
  Administered 2013-08-28: 8 [IU] via SUBCUTANEOUS
  Administered 2013-08-29 (×2): 3 [IU] via SUBCUTANEOUS

## 2013-08-26 MED ORDER — PANTOPRAZOLE SODIUM 40 MG PO TBEC
80.0000 mg | DELAYED_RELEASE_TABLET | Freq: Every day | ORAL | Status: DC
  Administered 2013-08-26 – 2013-08-29 (×4): 80 mg via ORAL
  Filled 2013-08-26 (×6): qty 2

## 2013-08-26 MED ORDER — HYDROXYZINE HCL 50 MG PO TABS
50.0000 mg | ORAL_TABLET | Freq: Every evening | ORAL | Status: DC | PRN
  Administered 2013-08-26: 50 mg via ORAL
  Filled 2013-08-26: qty 1
  Filled 2013-08-26: qty 3

## 2013-08-26 MED ORDER — CHLORDIAZEPOXIDE HCL 25 MG PO CAPS
25.0000 mg | ORAL_CAPSULE | Freq: Four times a day (QID) | ORAL | Status: DC | PRN
  Administered 2013-08-28: 25 mg via ORAL
  Filled 2013-08-26: qty 1

## 2013-08-26 MED ORDER — MAGNESIUM HYDROXIDE 400 MG/5ML PO SUSP: 30.0000 mL | Freq: Every day | ORAL | Status: DC | PRN

## 2013-08-26 MED ORDER — ONDANSETRON 4 MG PO TBDP: 4.0000 mg | ORAL_TABLET | Freq: Four times a day (QID) | ORAL | Status: AC | PRN

## 2013-08-26 MED ORDER — CHLORDIAZEPOXIDE HCL 25 MG PO CAPS
25.0000 mg | ORAL_CAPSULE | Freq: Three times a day (TID) | ORAL | Status: AC
  Administered 2013-08-27 (×3): 25 mg via ORAL
  Filled 2013-08-26 (×3): qty 1

## 2013-08-26 MED ORDER — MELOXICAM 7.5 MG PO TABS: 7.5000 mg | ORAL_TABLET | Freq: Every day | ORAL | Status: DC

## 2013-08-26 MED ORDER — ACETAMINOPHEN 325 MG PO TABS
650.0000 mg | ORAL_TABLET | Freq: Four times a day (QID) | ORAL | Status: DC | PRN
  Administered 2013-08-26: 650 mg via ORAL

## 2013-08-26 MED ORDER — ALUM & MAG HYDROXIDE-SIMETH 200-200-20 MG/5ML PO SUSP: 30.0000 mL | ORAL | Status: DC | PRN

## 2013-08-26 MED ORDER — CHLORDIAZEPOXIDE HCL 25 MG PO CAPS
25.0000 mg | ORAL_CAPSULE | Freq: Every day | ORAL | Status: AC
  Administered 2013-08-29: 25 mg via ORAL
  Filled 2013-08-26: qty 1

## 2013-08-26 MED ORDER — INSULIN ASPART 100 UNIT/ML ~~LOC~~ SOLN
4.0000 [IU] | Freq: Three times a day (TID) | SUBCUTANEOUS | Status: DC
  Administered 2013-08-26 – 2013-08-29 (×10): 4 [IU] via SUBCUTANEOUS

## 2013-08-26 MED ORDER — METFORMIN HCL 500 MG PO TABS
1000.0000 mg | ORAL_TABLET | Freq: Two times a day (BID) | ORAL | Status: DC
  Administered 2013-08-26 – 2013-08-29 (×6): 1000 mg via ORAL
  Filled 2013-08-26 (×8): qty 2

## 2013-08-26 MED ORDER — SIMVASTATIN 40 MG PO TABS
40.0000 mg | ORAL_TABLET | Freq: Every evening | ORAL | Status: DC
  Administered 2013-08-26 – 2013-08-27 (×2): 40 mg via ORAL
  Filled 2013-08-26 (×4): qty 1

## 2013-08-26 MED ORDER — THIAMINE HCL 100 MG/ML IJ SOLN
100.0000 mg | Freq: Once | INTRAMUSCULAR | Status: AC
  Administered 2013-08-26: 100 mg via INTRAMUSCULAR
  Filled 2013-08-26: qty 2

## 2013-08-26 MED ORDER — ALBUTEROL SULFATE (5 MG/ML) 0.5% IN NEBU: 2.5000 mg | INHALATION_SOLUTION | Freq: Four times a day (QID) | RESPIRATORY_TRACT | Status: DC | PRN

## 2013-08-26 NOTE — Progress Notes (Signed)
Recreation Therapy Notes  Date: 09.30.2014 Time: 3:00pm Location: [address] Dayroom  Group Topic: Communication  Goal Area(s) Addresses:  Patient will effectively communicate verbally with group members. Patient will effectively communicate non-verbally with group members.  Patient will identify benefit of healthy communication.   Behavioral Response: Did not attend.   Akai Dollard L Aiken Withem, LRT/CTRS  Torrance Stockley L 08/26/2013 4:52 PM 
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
## 2013-08-26 NOTE — ED Provider Notes (Signed)
Accepted at [facility]  Flint Melter, MD 08/26/13 [phone]

## 2013-08-26 NOTE — Progress Notes (Signed)
D: Patient denies SI/HI and A/V hallucinations; patient reports some nausea, tremors, and chills  A: Monitored q 15 minutes; patient encouraged to attend groups; patient educated about medications; patient given medications per physician orders; patient encouraged to express feelings and/or concerns  R: Patient is agitated because she did not wake up for breakfast and that she was not feeling well so she began to cry; patient's interaction with staff and peers is appropriate but minimal; patient was able to set goal to talk with staff 1:1 when having feelings of SI; patient is taking medications as prescribed and tolerating medications; patient has not attended any groups

## 2013-08-26 NOTE — Progress Notes (Signed)
The focus of this group is to educate the patient on the purpose and policies of crisis stabilization and provide a format to answer questions about their admission.  The group details unit policies and expectations of patients while admitted. Patient did not attend the morning group. 

## 2013-08-26 NOTE — Progress Notes (Signed)
Adult Psychoeducational Group Note  Date:  08/26/2013 Time:  9:57 PM  Group Topic/Focus:  Wrap-Up Group:   The focus of this group is to help patients review their daily goal of treatment and discuss progress on daily workbooks.  Participation Level:  Did Not Attend  Modena Nunnery 08/26/2013, 9:57 PM

## 2013-08-26 NOTE — Progress Notes (Signed)
Patient ID: Diana Santos, female   DOB: 03-20-1961, 52 y.o.   MRN: [id number] 52 yo patient admitted for SI, and ETOH abuse. Pt. Reports she also been using some cocaine. Pt. Also reports "voices trying tell me to hurt myself". "I see shadows, watching me" Pt. Says she wants detox. Pt. Contracts for safety. Pt. Has medical hx of Asthma, HTN, DM, hypercholesteremia and arthritis. Pt. Lives with her mother.  Per reports she reports having had been sober for 3 months when she began hanging out in the streets late at night with the wrong crowds. She states that she has been "jumping in the car at night with strange men in which she does not know". She continues, "I just want my life back I have lost everything". Patient reports hearing voices telling her to harm herself. She has a plan to stand in front of a car. She reports SI on one occasion since she has been at [facility], but contracts for safety. Her longest period of sobriety was approximately 9-10 months. She reports that she has been drinking since "around age 33". She attributes "what she is going through" partially to the death of her oldest child (son) at age 45, 6 years ago. She states, "not a day goes by that I do not think about this". Pt. Given snacks and drink. Staff will monitor q68min for safety.

## 2013-08-26 NOTE — Progress Notes (Signed)
Nurse practitioner made aware that cbg was 166 and taken at 0429 hrs 08/26/13 and that none of her home medications were restarted; nurse practitioner told the nurse that she will address and that please check cbg at 1100 because patient had no breakfast.

## 2013-08-26 NOTE — BHH Suicide Risk Assessment (Signed)
B[facility] INPATIENT:  Family/Significant Other Suicide Prevention Education  Suicide Prevention Education:  Education Completed; Temima Kutsch (pt's son) [phone], has been identified by the patient as the family member/significant other with whom the patient will be residing, and identified as the person(s) who will aid the patient in the event of a mental health crisis (suicidal ideations/suicide attempt).  With written consent from the patient, the family member/significant other has been provided the following suicide prevention education, prior to the and/or following the discharge of the patient.  The suicide prevention education provided includes the following:  Suicide risk factors  Suicide prevention and interventions  National Suicide Hotline telephone number  [facility] assessment telephone number  [facility] Emergency Assistance 911  [facility] and/or Residential Mobile Crisis Unit telephone number  Request made of family/significant other to:  Remove weapons (e.g., guns, rifles, knives), all items previously/currently identified as safety concern.    Remove drugs/medications (over-the-counter, prescriptions, illicit drugs), all items previously/currently identified as a safety concern.  The family member/significant other verbalizes understanding of the suicide prevention education information provided.  The family member/significant other agrees to remove the items of safety concern listed above.  Smart, Nickalaus Crooke 08/26/2013, 3:07 PM

## 2013-08-26 NOTE — BHH Group Notes (Signed)
[facility] LCSW Group Therapy  08/26/2013 1:51 PM  Type of Therapy:  Group Therapy  Participation Level:  Did Not Attend   Smart, Rozanna Cormany 08/26/2013, 1:51 PM

## 2013-08-26 NOTE — Consult Note (Signed)
[facility] Face-to-Face Psychiatry Consult   Reason for Consult:  52 year old female presents to [facility] for alcohol detox and SI Referring Physician:  Shakita Santos is an 52 y.o. female.  Assessment: AXIS I:  Substance Abuse and suicidal ideation  AXIS II:  deferred AXIS III:   Past Medical History  Diagnosis Date  . Diabetes mellitus without complication   . Hypertension   . Asthma   . Arthritis   . Bipolar 1 disorder, depressed    AXIS IV:  economic problems and problems related to social environment AXIS V:  21-30 behavior considerably influenced by delusions or hallucinations OR serious impairment in judgment, communication OR inability to function in almost all areas  Plan:  Recommend psychiatric Inpatient admission when medically cleared.  Subjective:   Diana Santos is a 52 y.o. female patient presented to [facility] requesting alcohol detox and with SI.  She expresses that she went on a drinking binge 2 night ago and that her last drink was morning of 08/24/13.  She reports having had been sober for 3 months when she began hanging out in the streets late at night with the wrong crowds.  She states that she has been "jumping in the car at night with strange men in which she does not know".  She continues, "I just want my life back I have lost everything".  Patient reports hearing voices telling her to harm herself.  She has a plan to stand in front of a car.  She reports SI on one occasion since she has been at [facility], but contracts for safety.  Denies HI or VH.  Her longest period of sobriety was approximately 9-10 months.  She reports that she has been drinking since "around age 23".  She attributes "what she is going through" partially to the death of her oldest child (son) at age 81, 6 years ago.  She states, "not a day goes by that I do not think about this".  She is an insulin dependent diabetic but has not taken any of her medications since she went on her drinking binge a few weeks ago.   Patient plans to stay with her mother who resides in [facility] upon discharge.           Past Psychiatric History: Past Medical History  Diagnosis Date  . Diabetes mellitus without complication   . Hypertension   . Asthma   . Arthritis   . Bipolar 1 disorder, depressed     reports that she has been smoking Cigarettes.  She has been smoking about 0.00 packs per day. She does not have any smokeless tobacco history on file. She reports that she drinks about 9.0 ounces of alcohol per week. She reports that she uses illicit drugs (Cocaine). History reviewed. No pertinent family history.         Allergies:   Allergies  Allergen Reactions  . Aspirin Anaphylaxis and Swelling    Objective: Blood pressure 121/76, pulse 81, temperature 98.5 F (36.9 C), temperature source Oral, resp. rate 18, height 5\' 5"  (1.651 m), weight 61.236 kg (135 lb), last menstrual period 06/30/2013, SpO2 95.00%.Body mass index is 22.47 kg/(m^2). Results for orders placed during the hospital encounter of 08/25/13 (from the past 72 hour(s))  GLUCOSE, CAPILLARY     Status: Abnormal   Collection Time    08/25/13 10:05 AM      Result Value Range   Glucose-Capillary 187 (*) 70 - 99 mg/dL  ACETAMINOPHEN LEVEL  Status: None   Collection Time    08/25/13 10:46 AM      Result Value Range   Acetaminophen (Tylenol), Serum <15.0  10 - 30 ug/mL   Comment:            THERAPEUTIC CONCENTRATIONS VARY     SIGNIFICANTLY. A RANGE OF 10-30     ug/mL MAY BE AN EFFECTIVE     CONCENTRATION FOR MANY PATIENTS.     HOWEVER, SOME ARE BEST TREATED     AT CONCENTRATIONS OUTSIDE THIS     RANGE.     ACETAMINOPHEN CONCENTRATIONS     >150 ug/mL AT 4 HOURS AFTER     INGESTION AND >50 ug/mL AT 12     HOURS AFTER INGESTION ARE     OFTEN ASSOCIATED WITH TOXIC     REACTIONS.  CBC     Status: Abnormal   Collection Time    08/25/13 10:46 AM      Result Value Range   WBC 11.4 (*) 4.0 - 10.5 K/uL   RBC 4.02  3.87 - 5.11 MIL/uL    Hemoglobin 11.4 (*) 12.0 - 15.0 g/dL   HCT 16.1 (*) 09.6 - 04.5 %   MCV 84.1  78.0 - 100.0 fL   MCH 28.4  26.0 - 34.0 pg   MCHC 33.7  30.0 - 36.0 g/dL   RDW 40.9  81.1 - 91.4 %   Platelets 283  150 - 400 K/uL  COMPREHENSIVE METABOLIC PANEL     Status: Abnormal   Collection Time    08/25/13 10:46 AM      Result Value Range   Sodium 134 (*) 135 - 145 mEq/L   Potassium 3.2 (*) 3.5 - 5.1 mEq/L   Chloride 98  96 - 112 mEq/L   CO2 27  19 - 32 mEq/L   Glucose, Bld 228 (*) 70 - 99 mg/dL   BUN 13  6 - 23 mg/dL   Creatinine, Ser 7.82  0.50 - 1.10 mg/dL   Calcium 9.3  8.4 - 95.6 mg/dL   Total Protein 7.7  6.0 - 8.3 g/dL   Albumin 3.7  3.5 - 5.2 g/dL   AST 15  0 - 37 U/L   ALT 16  0 - 35 U/L   Alkaline Phosphatase 86  39 - 117 U/L   Total Bilirubin 0.4  0.3 - 1.2 mg/dL   GFR calc non Af Amer >90  >90 mL/min   GFR calc Af Amer >90  >90 mL/min   Comment: (NOTE)     The eGFR has been calculated using the CKD EPI equation.     This calculation has not been validated in all clinical situations.     eGFR's persistently <90 mL/min signify possible Chronic Kidney     Disease.  ETHANOL     Status: None   Collection Time    08/25/13 10:46 AM      Result Value Range   Alcohol, Ethyl (B) <11  0 - 11 mg/dL   Comment:            LOWEST DETECTABLE LIMIT FOR     SERUM ALCOHOL IS 11 mg/dL     FOR MEDICAL PURPOSES ONLY  SALICYLATE LEVEL     Status: Abnormal   Collection Time    08/25/13 10:46 AM      Result Value Range   Salicylate Lvl <2.0 (*) 2.8 - 20.0 mg/dL  URINE RAPID DRUG SCREEN (HOSP PERFORMED)  Status: Abnormal   Collection Time    08/25/13 11:55 AM      Result Value Range   Opiates NONE DETECTED  NONE DETECTED   Cocaine POSITIVE (*) NONE DETECTED   Benzodiazepines NONE DETECTED  NONE DETECTED   Amphetamines NONE DETECTED  NONE DETECTED   Tetrahydrocannabinol NONE DETECTED  NONE DETECTED   Barbiturates NONE DETECTED  NONE DETECTED   Comment:            DRUG SCREEN FOR MEDICAL  PURPOSES     ONLY.  IF CONFIRMATION IS NEEDED     FOR ANY PURPOSE, NOTIFY LAB     WITHIN 5 DAYS.                LOWEST DETECTABLE LIMITS     FOR URINE DRUG SCREEN     Drug Class       Cutoff (ng/mL)     Amphetamine      1000     Barbiturate      200     Benzodiazepine   200     Tricyclics       300     Opiates          300     Cocaine          300     THC              50  GLUCOSE, CAPILLARY     Status: Abnormal   Collection Time    08/25/13  9:08 PM      Result Value Range   Glucose-Capillary 204 (*) 70 - 99 mg/dL   Labs reviewed- stable   Current Facility-Administered Medications  Medication Dose Route Frequency Provider Last Rate Last Dose  . albuterol (PROVENTIL) (5 MG/ML) 0.5% nebulizer solution 2.5 mg  2.5 mg Nebulization Q4H PRN Ethelda Chick, MD      . folic acid (FOLVITE) tablet 1 mg  1 mg Oral Daily Gilda Crease, MD   1 mg at 08/25/13 1825  . haloperidol (HALDOL) tablet 0.5 mg  0.5 mg Oral Daily Ethelda Chick, MD   0.5 mg at 08/25/13 2202  . insulin aspart (novoLOG) injection 0-15 Units  0-15 Units Subcutaneous TID WC Ethelda Chick, MD      . insulin aspart (novoLOG) injection 4-15 Units  4-15 Units Subcutaneous Daily Ethelda Chick, MD   5 Units at 08/25/13 2204  . insulin glargine (LANTUS) injection 45 Units  45 Units Subcutaneous QHS Ethelda Chick, MD   45 Units at 08/25/13 2204  . LORazepam (ATIVAN) tablet 1 mg  1 mg Oral Q6H PRN Gilda Crease, MD       Or  . LORazepam (ATIVAN) injection 1 mg  1 mg Intravenous Q6H PRN Gilda Crease, MD      . LORazepam (ATIVAN) tablet 0-4 mg  0-4 mg Oral Q6H Gilda Crease, MD   1 mg at 08/25/13 1819   Followed by  . [START ON 08/27/2013] LORazepam (ATIVAN) tablet 0-4 mg  0-4 mg Oral Q12H Gilda Crease, MD      . meloxicam [facility]) tablet 7.5 mg  7.5 mg Oral Daily Ethelda Chick, MD   7.5 mg at 08/25/13 2203  . metFORMIN (GLUCOPHAGE) tablet 1,000 mg  1,000 mg Oral BID WC Ethelda Chick, MD      . multivitamin with minerals tablet 1 tablet  1 tablet Oral Daily Gilda Crease, MD  1 tablet at 08/25/13 1825  . pantoprazole (PROTONIX) EC tablet 80 mg  80 mg Oral Daily Ethelda Chick, MD   80 mg at 08/25/13 2203  . simvastatin (ZOCOR) tablet 40 mg  40 mg Oral QPM Ethelda Chick, MD   40 mg at 08/25/13 2203  . thiamine (VITAMIN B-1) tablet 100 mg  100 mg Oral Daily Gilda Crease, MD   100 mg at 08/25/13 1815   Or  . thiamine (B-1) injection 100 mg  100 mg Intravenous Daily Gilda Crease, MD       Current Outpatient Prescriptions  Medication Sig Dispense Refill  . FLUoxetine HCl (PROZAC PO) Take 1 tablet by mouth daily.      . haloperidol (HALDOL) 0.5 MG tablet Take 0.5 mg by mouth daily.      . insulin aspart (NOVOLOG) 100 UNIT/ML injection Inject 4-15 Units into the skin daily.       . insulin glargine (LANTUS) 100 UNIT/ML injection Inject 45 Units into the skin at bedtime.      . meloxicam (MOBIC) 7.5 MG tablet Take 7.5 mg by mouth daily.      . metFORMIN (GLUCOPHAGE) 1000 MG tablet Take 1,000 mg by mouth 2 (two) times daily with a meal.      . omeprazole (PRILOSEC) 40 MG capsule Take 40 mg by mouth daily.      . simvastatin (ZOCOR) 40 MG tablet Take 40 mg by mouth every evening.      Marland Kitchen albuterol (PROVENTIL) (2.5 MG/3ML) 0.083% nebulizer solution Take 2.5 mg by nebulization every 6 (six) hours as needed for wheezing.        Psychiatric Specialty Exam:     Blood pressure 121/76, pulse 81, temperature 98.5 F (36.9 C), temperature source Oral, resp. rate 18, height 5\' 5"  (1.651 m), weight 61.236 kg (135 lb), last menstrual period 06/30/2013, SpO2 95.00%.Body mass index is 22.47 kg/(m^2).  General Appearance: Neat  Eye Contact::  Minimal  Speech:  Clear and Coherent  Volume:  Normal  Mood:  Depressed  Affect:  Depressed and Flat  Thought Process:  Coherent  Orientation:  Full (Time, Place, and Person)  Thought Content:  Hallucinations:  Auditory Command:  telling her to harm herself by standing in front of a car  Suicidal Thoughts:  Yes.  without intent/plan  Homicidal Thoughts:  No  Memory:  Immediate;   Fair Remote;   Fair  Judgement:  Impaired  Insight:  Fair  Psychomotor Activity:  Negative  Concentration:  Fair  Recall:  Fair  Akathisia:  No  Handed:    AIMS (if indicated):     Assets:  Communication Skills Desire for Improvement Housing Social Support  Sleep:     Treatment Plan Summary: 1) Meeting inpatient criteria for crisis management, safety, and stabilization of substance abuse and SI, bed 305-1 hall [facility] 2) SW to aid or facilitate in outpatient support services and Psychiatric management  3) Management of applicable co-mobidities  4) Administration of Psychotropics and/or psychotherapuetic interventions   Kizzie Fantasia CORI 08/26/2013 2:00 AM
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
## 2013-08-26 NOTE — Clinical Social Work Note (Signed)
Per pt request, letter with admission date faxed to attorney [facility] [employer]. [phone] fax. ROI signed by pt. Copy and fax confirmation given to pt.
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
## 2013-08-26 NOTE — Progress Notes (Signed)
Recreation Therapy Notes   Date: 09.30.2014 Time: 2:30pm Location: 300 Hall Dayroom  Group Topic: Software engineer Activities (AAA)  Behavioral Response: Engaged, Attentive, Appropriate  Affect: Euthymic  Clinical Observations/Feedback: Dog Team: Education officer, museum. Patient interacted appropriately with peer, dog team, LRT and MHT.   Marykay Lex Dell Hurtubise, LRT/CTRS  Sharrie Self L 08/26/2013 3:59 PM

## 2013-08-27 LAB — GLUCOSE, CAPILLARY: Glucose-Capillary: 121 mg/dL — ABNORMAL HIGH (ref 70–99)

## 2013-08-27 NOTE — Consult Note (Signed)
Agree with assessment and plan Karaline Buresh A. Vaani Morren, M.D. 

## 2013-08-27 NOTE — BHH Group Notes (Signed)
[facility] LCSW Aftercare Discharge Planning Group Note   08/27/2013 10:41 AM  Participation Quality:  Appropriate   Mood/Affect:  Depressed  Depression Rating:  0  Anxiety Rating:  0  Thoughts of Suicide:  No Will you contract for safety?   NA  Current AVH:  No  Plan for Discharge/Comments:  Pt presented with depressed mood and flat affect, but rated depression as 0 today. She is hoping to get bed at [facility] but reports that she can stay with her son or sister while waiting for bed availability if none available by d/c. Pt plans to follow up at [facility] for med management. She is not open to referral to [facility] as back up plan.   Transportation Means: son/sister/bus pass possibly  Supports: son/daughter/sister/some other family support.   Smart, [employer]

## 2013-08-27 NOTE — BHH Group Notes (Signed)
[facility] LCSW Group Therapy  08/27/2013 3:00 PM  Type of Therapy:  Group Therapy  Participation Level:  DID NOT ATTEND   *pt came in last five minutes of group and sat quietly. She did not participate or choose to share anything during "closing thoughts" portion of group.  Smart, Jelissa Espiritu 08/27/2013, 3:00 PM

## 2013-08-27 NOTE — Progress Notes (Signed)
D: Patient denies HI and A/V hallucinations and reports on and off thoughts SI; patient reports sleep is well; reports appetite is good; reports energy level is low ; reports ability to pay attention isimproving; rates depression as 0/10; rates hopelessness 9/10; complaints of chills and agitation related to withdrawal  A: Monitored q 15 minutes; patient encouraged to attend groups; patient educated about medications; patient given medications per physician orders; patient encouraged to express feelings and/or concerns  R: Patient is appropriate and has calmed down since room assignment is changed ; patient's interaction with staff and peers is appropriate; patient was able to set goal to talk with staff 1:1 when having feelings of SI; patient is taking medications as prescribed and tolerating medications; patient is attending all groups and engaging

## 2013-08-27 NOTE — Progress Notes (Signed)
[facility] MD Progress Note  08/27/2013 3:58 PM Diana Santos  MRN:  [id number] Subjective:  Diana Santos endorses that she feels tired, fatigue. She is detoxing from alcohol and cocaine. She is trying to get herself together and not give in to the cravings (staes she is just thinking about the high, not the aftermath) . She admits to a lot of shame and quilt for what she has done in the last few weeks. She admits that she would probably be dead by now given the dangerous situations she has gotten into.  Diagnosis:   DSM5: Schizophrenia Disorders:   Obsessive-Compulsive Disorders:   Trauma-Stressor Disorders:   Substance/Addictive Disorders:  Alcohol Related Disorder - Severe (303.90), Cocaine related Disorder Depressive Disorders:  Major Depressive Disorder - with Psychotic Features (296.24)  Axis I: Substance Induced Mood Disorder  ADL's:  Intact  Sleep: Fair  Appetite:  better  Suicidal Ideation:  Plan:  denies Intent:  denies Means:  denies Homicidal Ideation:  Plan:  denies Intent:  denies Means:  denies AEB (as evidenced by):  Psychiatric Specialty Exam: Review of Systems  Constitutional: Positive for malaise/fatigue.  HENT: Negative.   Eyes: Negative.   Respiratory: Negative.   Cardiovascular: Negative.   Gastrointestinal: Negative.   Genitourinary: Negative.   Musculoskeletal: Negative.   Skin: Negative.   Neurological: Positive for weakness.  Endo/Heme/Allergies: Negative.   Psychiatric/Behavioral: Positive for depression and substance abuse. The patient is nervous/anxious.     Blood pressure 143/88, pulse 76, temperature 97.6 F (36.4 C), temperature source Oral, resp. rate 24, height 5' 4.57" (1.64 m), weight 63.504 kg (140 lb), last menstrual period 06/30/2013.Body mass index is 23.61 kg/(m^2).  General Appearance: Disheveled  Eye Solicitor::  Fair  Speech:  Clear and Coherent and Slow  Volume:  Decreased  Mood:  Anxious and Depressed  Affect:  Restricted  Thought  Process:  Coherent and Goal Directed  Orientation:  Full (Time, Place, and Person)  Thought Content:  worries, concerns, fears, shame and guilt  Suicidal Thoughts:  No  Homicidal Thoughts:  No  Memory:  Immediate;   Fair Recent;   Fair Remote;   Fair  Judgement:  Fair  Insight:  Shallow  Psychomotor Activity:  Restlessness  Concentration:  Fair  Recall:  Fair  Akathisia:  No  Handed:    AIMS (if indicated):     Assets:  Desire for Improvement  Sleep:  Number of Hours: 6.25   Current Medications: Current Facility-Administered Medications  Medication Dose Route Frequency Provider Last Rate Last Dose  . acetaminophen (TYLENOL) tablet 650 mg  650 mg Oral Q6H PRN Audrea Muscat, NP   650 mg at 08/26/13 2227  . albuterol (PROVENTIL) (5 MG/ML) 0.5% nebulizer solution 2.5 mg  2.5 mg Nebulization Q6H PRN Sanjuana Kava, NP      . alum & mag hydroxide-simeth (MAALOX/MYLANTA) 200-200-20 MG/5ML suspension 30 mL  30 mL Oral Q4H PRN Evanna Janann August, NP      . chlordiazePOXIDE (LIBRIUM) capsule 25 mg  25 mg Oral Q6H PRN Evanna Janann August, NP      . chlordiazePOXIDE (LIBRIUM) capsule 25 mg  25 mg Oral TID Rachael Fee, MD   25 mg at 08/27/13 1212   Followed by  . [START ON 08/28/2013] chlordiazePOXIDE (LIBRIUM) capsule 25 mg  25 mg Oral BH-qamhs Rachael Fee, MD       Followed by  . [START ON 08/29/2013] chlordiazePOXIDE (LIBRIUM) capsule 25 mg  25 mg Oral Daily Madie Reno A  Dub Mikes, MD      . FLUoxetine (PROZAC) capsule 10 mg  10 mg Oral Daily Rachael Fee, MD   10 mg at 08/27/13 0759  . haloperidol (HALDOL) tablet 0.5 mg  0.5 mg Oral Daily Rachael Fee, MD   0.5 mg at 08/27/13 0759  . hydrOXYzine (ATARAX/VISTARIL) tablet 50 mg  50 mg Oral QHS PRN Audrea Muscat, NP   50 mg at 08/26/13 2226  . insulin aspart (novoLOG) injection 0-15 Units  0-15 Units Subcutaneous TID WC Sanjuana Kava, NP   3 Units at 08/27/13 1214  . insulin aspart (novoLOG) injection 4 Units  4 Units Subcutaneous TID  WC Sanjuana Kava, NP   4 Units at 08/27/13 1214  . insulin glargine (LANTUS) injection 45 Units  45 Units Subcutaneous QHS Sanjuana Kava, NP   45 Units at 08/26/13 2226  . loperamide (IMODIUM) capsule 2-4 mg  2-4 mg Oral PRN Rachael Fee, MD      . magnesium hydroxide (MILK OF MAGNESIA) suspension 30 mL  30 mL Oral Daily PRN Evanna Janann August, NP      . metFORMIN (GLUCOPHAGE) tablet 1,000 mg  1,000 mg Oral BID WC Sanjuana Kava, NP   1,000 mg at 08/27/13 0759  . multivitamin with minerals tablet 1 tablet  1 tablet Oral Daily Rachael Fee, MD   1 tablet at 08/27/13 0759  . nicotine (NICODERM CQ - dosed in mg/24 hours) patch 21 mg  21 mg Transdermal Q0600 Rachael Fee, MD   21 mg at 08/27/13 0758  . ondansetron (ZOFRAN-ODT) disintegrating tablet 4 mg  4 mg Oral Q6H PRN Rachael Fee, MD      . pantoprazole (PROTONIX) EC tablet 80 mg  80 mg Oral Daily Sanjuana Kava, NP   80 mg at 08/27/13 0759  . simvastatin (ZOCOR) tablet 40 mg  40 mg Oral QPM Sanjuana Kava, NP   40 mg at 08/26/13 1830  . thiamine (VITAMIN B-1) tablet 100 mg  100 mg Oral Daily Rachael Fee, MD   100 mg at 08/27/13 0759    Lab Results:  Results for orders placed during the hospital encounter of 08/26/13 (from the past 48 hour(s))  GLUCOSE, CAPILLARY     Status: Abnormal   Collection Time    08/26/13  4:29 AM      Result Value Range   Glucose-Capillary 166 (*) 70 - 99 mg/dL  COMPREHENSIVE METABOLIC PANEL     Status: Abnormal   Collection Time    08/26/13  6:25 AM      Result Value Range   Sodium 137  135 - 145 mEq/L   Potassium 4.3  3.5 - 5.1 mEq/L   Chloride 105  96 - 112 mEq/L   CO2 24  19 - 32 mEq/L   Glucose, Bld 191 (*) 70 - 99 mg/dL   BUN 18  6 - 23 mg/dL   Creatinine, Ser 1.61  0.50 - 1.10 mg/dL   Calcium 9.1  8.4 - 09.6 mg/dL   Total Protein 6.9  6.0 - 8.3 g/dL   Albumin 3.3 (*) 3.5 - 5.2 g/dL   AST 13  0 - 37 U/L   ALT 15  0 - 35 U/L   Alkaline Phosphatase 86  39 - 117 U/L   Total Bilirubin 0.3  0.3 -  1.2 mg/dL   GFR calc non Af Amer >90  >90 mL/min   GFR calc Af  Amer >90  >90 mL/min   Comment: (NOTE)     The eGFR has been calculated using the CKD EPI equation.     This calculation has not been validated in all clinical situations.     eGFR's persistently <90 mL/min signify possible Chronic Kidney     Disease.     Performed at [facility]  GLUCOSE, CAPILLARY     Status: Abnormal   Collection Time    08/26/13 12:02 PM      Result Value Range   Glucose-Capillary 295 (*) 70 - 99 mg/dL  GLUCOSE, CAPILLARY     Status: Abnormal   Collection Time    08/26/13  4:43 PM      Result Value Range   Glucose-Capillary 194 (*) 70 - 99 mg/dL  GLUCOSE, CAPILLARY     Status: Abnormal   Collection Time    08/26/13  8:47 PM      Result Value Range   Glucose-Capillary 186 (*) 70 - 99 mg/dL  GLUCOSE, CAPILLARY     Status: Abnormal   Collection Time    08/27/13  6:48 AM      Result Value Range   Glucose-Capillary 121 (*) 70 - 99 mg/dL  GLUCOSE, CAPILLARY     Status: Abnormal   Collection Time    08/27/13 12:08 PM      Result Value Range   Glucose-Capillary 192 (*) 70 - 99 mg/dL    Physical Findings: AIMS: Facial and Oral Movements Muscles of Facial Expression: None, normal Lips and Perioral Area: None, normal Jaw: None, normal Tongue: None, normal,Extremity Movements Upper (arms, wrists, hands, fingers): None, normal Lower (legs, knees, ankles, toes): None, normal, Trunk Movements Neck, shoulders, hips: None, normal, Overall Severity Severity of abnormal movements (highest score from questions above): None, normal Incapacitation due to abnormal movements: None, normal Patient's awareness of abnormal movements (rate only patient's report): No Awareness, Dental Status Current problems with teeth and/or dentures?: Yes (upper dentures) Does patient usually wear dentures?: No  CIWA:  CIWA-Ar Total: 0 COWS:     Treatment Plan Summary: Daily contact with patient to assess  and evaluate symptoms and progress in treatment Medication management  Plan: Supportive approach/coping skills/relapse prevention           Reassess co morbidities           Pursue detox  Medical Decision Making Problem Points:  Review of psycho-social stressors (1) Data Points:  Review of medication regiment & side effects (2)  I certify that inpatient services furnished can reasonably be expected to improve the patient's condition.   Guillermo Difrancesco A 08/27/2013, 3:58 PM

## 2013-08-27 NOTE — Tx Team (Signed)
Interdisciplinary Treatment Plan Update (Adult)  Date: 08/27/2013   Time Reviewed: 10:50 AM  Progress in Treatment:  Attending groups: Yes Participating in groups:  Yes Taking medication as prescribed: Yes  Tolerating medication: Yes  Family/Significant othe contact made: Yes, SPE completed with pt's son.  Diana Santos understands diagnosis: Yes, AEB seeking treatment for SI, depression, and ETOH detox.  Discussing Diana Santos identified problems/goals with staff: Yes  Medical problems stabilized or resolved: Yes  Denies suicidal/homicidal ideation: Yes, during group/self report.  Diana Santos has not harmed self or Others: Yes  New problem(s) identified: n/a  Discharge Plan or Barriers: Referral to [facility] for med management. Pt able to stay with her son/sister until bed available if d/ced from hospital before admission into [facility].  Additional comments: Does not feel like living anymore. States she started feeling depressed, started drinking. States there is nothing going right in her life. She stop taking her medication. Drinks what ever she can get beer, wine, liquor. Has had something to drink everyday for the last several weeks. States she experiences shakes, sweats. States she is tired of the way her life is going. States that she has been using crack last two weeks has been in and out cars trying to get more. When she came down called her son. She walked all night. She brought her here.  Reason for Continuation of Hospitalization: Librium taper-withdrawals Mood stabilization Medication management  Estimated length of stay: 1-2 days  For review of initial/current Diana Santos goals, please see plan of care.  Attendees:  Diana Santos:    Family:    Physician: Geoffery Lyons MD 08/27/2013 10:52 AM   Nursing: Lupita Leash RN 08/27/2013 10:53 AM   Clinical Social Worker Maryjo Ragon Smart, LCSWA  08/27/2013 10:53 AM   Other: Philippa Chester RN 08/27/2013 10:53 AM   Other: Chandra Batch. CM 08/27/2013 10:53 AM  Other: Darden Dates Nurse  CM  08/27/2013 10:53 AM   Other:    Scribe for Treatment Team:  [employer] LCSWA 08/27/2013 10:53 AM

## 2013-08-27 NOTE — Progress Notes (Signed)
Patient ID: Diana Santos, female   DOB: 01/11/1961, 52 y.o.   MRN: [id number] Patient report left foot pain and let great toe pain, both present for "a while".  She expresses that she has seen  Podiatrist, has follow-up appointment 08/2013 unsure of exact date, for re-evaluation.  She explains that she was informed at her last visit with Podiatry that she may be a surgical candidate if her hemoglobin A1C is stable.    Bunion noted left dorsal foot approximately 2cm x 2cm without erythema with mild tenderness to palpation noted.  Left great toe mild tenderness noted to palpation, without erythema, discoloration, or odor.  May use Acetaminophen as ordered for pain if desires for mild pain. Patient verbalized understanding of instructions.  RN notified of findings.

## 2013-08-27 NOTE — Progress Notes (Signed)
Writer entered patients room and observed her lying in bed resting and appeared asleep but was easily aroused when her name was called. Patient reports that overall her day was ok. Writer informed patient of medications scheduled and available. Patient reports that the trazadone made her feel really sleepy and bad this morning and she does not want to take that anymore. Writer informed patient would check with NP and see if another medication could be used instead of trazadone. Patient c/o foot pain along with tingling and throbbing sensation. NP was informed of patients complaint of foot pain and medication for sleep, reports she will take a look at her feet and order an alternate sleep med. Patient denies si/hi/a/v hallucinations. Support and encouragement offered, safety maintained with 15 min checks, will continue to monitor.

## 2013-08-27 NOTE — Progress Notes (Signed)
Pt didn't attend Group

## 2013-08-28 DIAGNOSIS — F411 Generalized anxiety disorder: Secondary | ICD-10-CM

## 2013-08-28 LAB — GLUCOSE, CAPILLARY
Glucose-Capillary: 255 mg/dL — ABNORMAL HIGH (ref 70–99)
Glucose-Capillary: 126 mg/dL — ABNORMAL HIGH (ref 70–99)
Glucose-Capillary: 191 mg/dL — ABNORMAL HIGH (ref 70–99)
Glucose-Capillary: 286 mg/dL — ABNORMAL HIGH (ref 70–99)
Glucose-Capillary: 114 mg/dL — ABNORMAL HIGH (ref 70–99)

## 2013-08-28 MED ORDER — LISINOPRIL 20 MG PO TABS
20.0000 mg | ORAL_TABLET | Freq: Once | ORAL | Status: AC
  Administered 2013-08-28: 20 mg via ORAL
  Filled 2013-08-28 (×2): qty 1

## 2013-08-28 MED ORDER — LISINOPRIL 10 MG PO TABS
10.0000 mg | ORAL_TABLET | Freq: Every day | ORAL | Status: DC
  Administered 2013-08-29: 10 mg via ORAL
  Filled 2013-08-28 (×2): qty 1

## 2013-08-28 NOTE — Progress Notes (Signed)
Recreation Therapy Notes  Date: 10.02.2014 Time: 2:30.2014 Location: [address] Dayroom  Group Topic: Software engineer Activities (AAA)  Behavioral Response: Did not attend  [employer], LRT/CTRS  Jearl Klinefelter 08/28/2013 5:16 PM

## 2013-08-28 NOTE — Progress Notes (Signed)
D.  Pt. Denies SI/HI and denies A/V hallucinations.  Pt. Has been focused on discharge this evening and became anxious and agitated.  B/P increased.  See DOC flow sheet A.  PRN given for Anxiety. R.  Pt.'s anxiety decreased and pt. Sleeping.  B/P decreased.

## 2013-08-28 NOTE — Progress Notes (Signed)
Patient ID: Diana Santos, female   DOB: Oct 19, 1961, 52 y.o.   MRN: [id number] D: pt. Reports depression at "10" of 10. Pt. Reports groups have been helpful. Pt. Plans to go to [facility] upon discharge. A: Writer encouraged pt. To continue on road to recovery. Staff encouraged group. Staff will monitor q15min for safety. R: pt. Is safe on the unit.

## 2013-08-28 NOTE — Progress Notes (Signed)
Patient did not attend the evening karaoke group. Pt remained in bed during group.  

## 2013-08-28 NOTE — Progress Notes (Signed)
Pt. Refused night medications including her Lantus

## 2013-08-28 NOTE — [facility] Group Notes (Signed)
[facility] LCSW Group Therapy  08/28/2013  1:15 PM   Type of Therapy:  Group Therapy  Participation Level:  Active  Participation Quality:  Appropriate and Attentive  Affect:  Appropriate and Calm  Cognitive:  Alert and Appropriate  Insight:  Developing/Improving and Engaged  Engagement in Therapy:  Developing/Improving and Engaged  Modes of Intervention:  Clarification, Confrontation, Discussion, Education, Exploration, Limit-setting, Orientation, Problem-solving, Rapport Building, Dance movement psychotherapist, Socialization and Support  Summary of Progress/Problems: The topic for group was balance in life.  Today's group focused on defining balance in one's own words, identifying things that can knock one off balance, and exploring healthy ways to maintain balance in life. Group members were asked to provide an example of a time when they felt off balance, describe how they handled that situation,and process healthier ways to regain balance in the future. Group members were asked to share the most important tool for maintaining balance that they learned while at [facility] and how they plan to apply this method after discharge.  Pt did not share personally but actively listened to group discussion .    Reyes Ivan, LCSWA 08/28/2013 3:05 PM

## 2013-08-28 NOTE — Progress Notes (Signed)
D: Patient denies SI/HI and A/V hallucinations; reports that her withdrawal symptoms are decreasing and   A: Monitored q 15 minutes; patient encouraged to attend groups; patient educated about medications; patient given medications per physician orders; patient encouraged to express feelings and/or concerns  R: Patient is appropriate and animated ; patient's interaction with staff and peers is appropriate;  patient is taking medications as prescribed and tolerating medications; patient is attending all groups and engaging

## 2013-08-28 NOTE — Progress Notes (Signed)
[facility] MD Progress Note  08/28/2013 2:54 PM Diana Santos  MRN:  [id number] Subjective:  States that she is committed to abstaining. States that she is a little anxious, but that she thinks she can handle it. Her family will be willing to allow her to wait for a bed at [facility] or [facility]. She states that if she relapses she is going to have no support and she cant see herself without being able to have her family around. She states she has not been this committed before Diagnosis:   DSM5: Schizophrenia Disorders:   Obsessive-Compulsive Disorders:   Trauma-Stressor Disorders:   Substance/Addictive Disorders:  Alcohol Related Disorder - Severe (303.90)  Cocaine related Disorder Depressive Disorders:  Major Depressive Disorder - Moderate (296.22)  Axis I: Anxiety Disorder NOS  ADL's:  Intact  Sleep: Fair  Appetite:  Fair  Suicidal Ideation:  Plan:  denies Intent:  denies Means:  denies Homicidal Ideation:  Plan:  denies Intent:  denies Means:  denies AEB (as evidenced by):  Psychiatric Specialty Exam: Review of Systems  Constitutional: Negative.   HENT: Negative.   Eyes: Negative.   Respiratory: Negative.   Cardiovascular: Negative.   Gastrointestinal: Negative.   Genitourinary: Negative.   Musculoskeletal: Negative.   Skin: Negative.   Neurological: Negative.   Endo/Heme/Allergies: Negative.   Psychiatric/Behavioral: Positive for depression and substance abuse. The patient is nervous/anxious.     Blood pressure 147/93, pulse 77, temperature 97.2 F (36.2 C), temperature source Oral, resp. rate 18, height 5' 4.57" (1.64 m), weight 63.504 kg (140 lb), last menstrual period 06/30/2013.Body mass index is 23.61 kg/(m^2).  General Appearance: Fairly Groomed  Patent attorney::  Fair  Speech:  Clear and Coherent and Slow  Volume:  Decreased  Mood:  Anxious and sad, worried  Affect:  Restricted  Thought Process:  Coherent and Goal Directed  Orientation:  Full (Time, Place, and  Person)  Thought Content:  worries, concerns  Suicidal Thoughts:  No  Homicidal Thoughts:  No  Memory:  Immediate;   Fair Recent;   Fair Remote;   Fair  Judgement:  Fair  Insight:  Present and Shallow  Psychomotor Activity:  Restlessness  Concentration:  Fair  Recall:  Fair  Akathisia:  No  Handed:    AIMS (if indicated):     Assets:  Desire for Improvement  Sleep:  Number of Hours: 5   Current Medications: Current Facility-Administered Medications  Medication Dose Route Frequency Provider Last Rate Last Dose  . acetaminophen (TYLENOL) tablet 650 mg  650 mg Oral Q6H PRN Audrea Muscat, NP   650 mg at 08/26/13 2227  . albuterol (PROVENTIL) (5 MG/ML) 0.5% nebulizer solution 2.5 mg  2.5 mg Nebulization Q6H PRN Sanjuana Kava, NP      . alum & mag hydroxide-simeth (MAALOX/MYLANTA) 200-200-20 MG/5ML suspension 30 mL  30 mL Oral Q4H PRN Evanna Janann August, NP      . chlordiazePOXIDE (LIBRIUM) capsule 25 mg  25 mg Oral Q6H PRN Evanna Janann August, NP      . chlordiazePOXIDE (LIBRIUM) capsule 25 mg  25 mg Oral BH-qamhs Rachael Fee, MD   25 mg at 08/28/13 4098   Followed by  . [START ON 08/29/2013] chlordiazePOXIDE (LIBRIUM) capsule 25 mg  25 mg Oral Daily Rachael Fee, MD      . FLUoxetine (PROZAC) capsule 10 mg  10 mg Oral Daily Rachael Fee, MD   10 mg at 08/28/13 0805  . haloperidol (HALDOL) tablet 0.5  mg  0.5 mg Oral Daily Rachael Fee, MD   0.5 mg at 08/28/13 0806  . hydrOXYzine (ATARAX/VISTARIL) tablet 50 mg  50 mg Oral QHS PRN Audrea Muscat, NP   50 mg at 08/26/13 2226  . insulin aspart (novoLOG) injection 0-15 Units  0-15 Units Subcutaneous TID WC Sanjuana Kava, NP   3 Units at 08/28/13 1203  . insulin aspart (novoLOG) injection 4 Units  4 Units Subcutaneous TID WC Sanjuana Kava, NP   4 Units at 08/28/13 1205  . insulin glargine (LANTUS) injection 45 Units  45 Units Subcutaneous QHS Sanjuana Kava, NP   45 Units at 08/27/13 2217  . [START ON 08/29/2013] lisinopril  (PRINIVIL,ZESTRIL) tablet 10 mg  10 mg Oral Daily Sanjuana Kava, NP      . loperamide (IMODIUM) capsule 2-4 mg  2-4 mg Oral PRN Rachael Fee, MD      . magnesium hydroxide (MILK OF MAGNESIA) suspension 30 mL  30 mL Oral Daily PRN Evanna Janann August, NP      . metFORMIN (GLUCOPHAGE) tablet 1,000 mg  1,000 mg Oral BID WC Sanjuana Kava, NP   1,000 mg at 08/28/13 0806  . multivitamin with minerals tablet 1 tablet  1 tablet Oral Daily Rachael Fee, MD   1 tablet at 08/28/13 0805  . nicotine (NICODERM CQ - dosed in mg/24 hours) patch 21 mg  21 mg Transdermal Q0600 Rachael Fee, MD   21 mg at 08/28/13 0646  . ondansetron (ZOFRAN-ODT) disintegrating tablet 4 mg  4 mg Oral Q6H PRN Rachael Fee, MD      . pantoprazole (PROTONIX) EC tablet 80 mg  80 mg Oral Daily Sanjuana Kava, NP   80 mg at 08/28/13 0805  . simvastatin (ZOCOR) tablet 40 mg  40 mg Oral QPM Sanjuana Kava, NP   40 mg at 08/27/13 1709  . thiamine (VITAMIN B-1) tablet 100 mg  100 mg Oral Daily Rachael Fee, MD   100 mg at 08/28/13 1610    Lab Results:  Results for orders placed during the hospital encounter of 08/26/13 (from the past 48 hour(s))  GLUCOSE, CAPILLARY     Status: Abnormal   Collection Time    08/26/13  4:43 PM      Result Value Range   Glucose-Capillary 194 (*) 70 - 99 mg/dL  GLUCOSE, CAPILLARY     Status: Abnormal   Collection Time    08/26/13  8:47 PM      Result Value Range   Glucose-Capillary 186 (*) 70 - 99 mg/dL  GLUCOSE, CAPILLARY     Status: Abnormal   Collection Time    08/27/13  6:48 AM      Result Value Range   Glucose-Capillary 121 (*) 70 - 99 mg/dL  GLUCOSE, CAPILLARY     Status: Abnormal   Collection Time    08/27/13 12:08 PM      Result Value Range   Glucose-Capillary 192 (*) 70 - 99 mg/dL  GLUCOSE, CAPILLARY     Status: Abnormal   Collection Time    08/27/13  5:00 PM      Result Value Range   Glucose-Capillary 202 (*) 70 - 99 mg/dL   Comment 1 Notify RN    GLUCOSE, CAPILLARY     Status:  Abnormal   Collection Time    08/27/13  9:52 PM      Result Value Range   Glucose-Capillary 255 (*)  70 - 99 mg/dL  GLUCOSE, CAPILLARY     Status: Abnormal   Collection Time    08/28/13  6:38 AM      Result Value Range   Glucose-Capillary 126 (*) 70 - 99 mg/dL    Physical Findings: AIMS: Facial and Oral Movements Muscles of Facial Expression: None, normal Lips and Perioral Area: None, normal Jaw: None, normal Tongue: None, normal,Extremity Movements Upper (arms, wrists, hands, fingers): None, normal Lower (legs, knees, ankles, toes): None, normal, Trunk Movements Neck, shoulders, hips: None, normal, Overall Severity Severity of abnormal movements (highest score from questions above): None, normal Incapacitation due to abnormal movements: None, normal Patient's awareness of abnormal movements (rate only patient's report): No Awareness, Dental Status Current problems with teeth and/or dentures?: Yes (upper dentures) Does patient usually wear dentures?: No  CIWA:  CIWA-Ar Total: 0 COWS:     Treatment Plan Summary: Daily contact with patient to assess and evaluate symptoms and progress in treatment Medication management  Plan: Supportive approach/coping skills/relapse prevention            Reassess and address the co morbidites  Medical Decision Making Problem Points:  Review of psycho-social stressors (1) Data Points:  Review of medication regiment & side effects (2)  I certify that inpatient services furnished can reasonably be expected to improve the patient's condition.   Shaleen Talamantez A 08/28/2013, 2:54 PM

## 2013-08-28 NOTE — Progress Notes (Signed)
Adult Psychoeducational Group Note  Date:  08/28/2013 Time:  2:46 PM  Group Topic/Focus:  Building Self Esteem:   The Focus of this group is helping patients become aware of the effects of self-esteem on their lives, the things they and others do that enhance or undermine their self-esteem, seeing the relationship between their level of self-esteem and the choices they make and learning ways to enhance self-esteem.  Participation Level:  Active  Participation Quality:  Appropriate and Sharing  Affect:  Appropriate  Cognitive:  Alert and Appropriate  Insight: Appropriate, Good and Improving  Engagement in Group:  Engaged, Improving and Supportive  Modes of Intervention:  Discussion, Education, Socialization and Support  Additional Comments:  When the group was asked about their self-esteem, pt said, " I have a very low, low, low, low...self esteem. Pt expressed disappointment in where she is in her life and blames herself. She plans to take her meds when she gets out, a pattern she often stops.  Reynolds Bowl 08/28/2013, 2:46 PM

## 2013-08-29 DIAGNOSIS — F1020 Alcohol dependence, uncomplicated: Principal | ICD-10-CM

## 2013-08-29 DIAGNOSIS — F323 Major depressive disorder, single episode, severe with psychotic features: Secondary | ICD-10-CM

## 2013-08-29 DIAGNOSIS — F1410 Cocaine abuse, uncomplicated: Secondary | ICD-10-CM

## 2013-08-29 LAB — GLUCOSE, CAPILLARY: Glucose-Capillary: 174 mg/dL — ABNORMAL HIGH (ref 70–99)

## 2013-08-29 MED ORDER — METFORMIN HCL 1000 MG PO TABS: 1000.0000 mg | ORAL_TABLET | Freq: Two times a day (BID) | ORAL | Status: DC

## 2013-08-29 MED ORDER — LISINOPRIL 10 MG PO TABS: 10.0000 mg | ORAL_TABLET | Freq: Every day | ORAL | Status: DC

## 2013-08-29 MED ORDER — OMEPRAZOLE 40 MG PO CPDR: 40.0000 mg | DELAYED_RELEASE_CAPSULE | Freq: Every day | ORAL | Status: DC

## 2013-08-29 MED ORDER — FLUOXETINE HCL 10 MG PO CAPS: 10.0000 mg | ORAL_CAPSULE | Freq: Every day | ORAL | Status: DC

## 2013-08-29 MED ORDER — ALBUTEROL SULFATE (2.5 MG/3ML) 0.083% IN NEBU: 2.5000 mg | INHALATION_SOLUTION | Freq: Four times a day (QID) | RESPIRATORY_TRACT | Status: DC | PRN

## 2013-08-29 MED ORDER — SIMVASTATIN 40 MG PO TABS: 40.0000 mg | ORAL_TABLET | Freq: Every evening | ORAL | Status: DC

## 2013-08-29 MED ORDER — HYDROXYZINE HCL 50 MG PO TABS: 50.0000 mg | ORAL_TABLET | Freq: Every evening | ORAL | Status: DC | PRN

## 2013-08-29 MED ORDER — HALOPERIDOL 0.5 MG PO TABS: 0.5000 mg | ORAL_TABLET | Freq: Every day | ORAL | Status: DC

## 2013-08-29 MED ORDER — INSULIN ASPART 100 UNIT/ML ~~LOC~~ SOLN: 4.0000 [IU] | Freq: Every day | SUBCUTANEOUS | Status: DC

## 2013-08-29 MED ORDER — MELOXICAM 7.5 MG PO TABS: 7.5000 mg | ORAL_TABLET | Freq: Every day | ORAL | Status: DC

## 2013-08-29 MED ORDER — INSULIN GLARGINE 100 UNIT/ML ~~LOC~~ SOLN: 45.0000 [IU] | Freq: Every day | SUBCUTANEOUS | Status: DC

## 2013-08-29 NOTE — Progress Notes (Signed)
Patient ID: Diana Santos, female   DOB: 02-22-1961, 52 y.o.   MRN: [id number] Patient discharged per physician order; patient denies SI/HI and A/V hallucinations; patient received samples, prescriptions, and copy of AVS after it was reviewed; patient had no other questions or concerns at this time; patient verbalized and signed that they received all belongings; patient left the unit ambulatory

## 2013-08-29 NOTE — Progress Notes (Signed)
Patient ID: Diana Santos, female   DOB: July 26, 1961, 52 y.o.   MRN: [id number] D: Pt. Lying bed, eyes closed. A: Writer assessed for s/s of distress. R: No distress noted, respirations even, unlabored.

## 2013-08-29 NOTE — Progress Notes (Signed)
Adult Psychoeducational Group Note  Date:  08/29/2013 Time:  2:20 PM  Group Topic/Focus: The activity encouraged patients to choose a letter on the whiteboard and describe a positive quality that begins with that letter.  Participation Level:  Minimal  Participation Quality:  Drowsy  Affect:  Appropriate when not asleep  Cognitive:  Oriented  Insight: Good  Engagement in Group:  Lacking and Limited  Modes of Intervention:  Activity, Education and Support  Additional Comments:  Pt chose the letter L for Loveable. She stated she is a very loveable person.  Reynolds Bowl 08/29/2013, 2:20 PM

## 2013-08-29 NOTE — [facility] Suicide Risk Assessment (Signed)
Suicide Risk Assessment  Discharge Assessment     Demographic Factors:  Low socioeconomic status and Living alone, female, african american  Mental Status Per Nursing Assessment::   On Admission:  Suicidal ideation indicated by patient  Current Mental Status by Physician: Patient alert and oriented to 4. Speech normal in rate and volume. Mood improved, affect anxious. Denies SI/HI/VH/AH  Loss Factors: Financial problems/change in socioeconomic status  Historical Factors: Family history of mental illness or substance abuse and Impulsivity  Risk Reduction Factors:   Positive social support and Positive coping skills or problem solving skills  Continued Clinical Symptoms:  Depression:   Recent sense of peace/wellbeing Alcohol/Substance Abuse/Dependencies  Cognitive Features That Contribute To Risk:  Cognitively intact  Suicide Risk:  Minimal: No identifiable suicidal ideation.  Patients presenting with no risk factors but with morbid ruminations; may be classified as minimal risk based on the severity of the depressive symptoms  Discharge Diagnoses:   AXIS I:  Alcohol Abuse and Depressive Disorder NOS AXIS II:  Deferred AXIS III:   Past Medical History  Diagnosis Date  . Diabetes mellitus without complication   . Hypertension   . Asthma   . Arthritis   . Bipolar 1 disorder, depressed    AXIS IV:  other psychosocial or environmental problems AXIS V:  61-70 mild symptoms  Plan Of Care/Follow-up recommendations:  Activity:  as tolerated Diet:  low salt diet  Is patient on multiple antipsychotic therapies at discharge:  No   Has Patient had three or more failed trials of antipsychotic monotherapy by history:  No  Recommended Plan for Multiple Antipsychotic Therapies: NA  Diana Santos 08/29/2013, 11:15 AM

## 2013-08-29 NOTE — Progress Notes (Signed)
[facility] Adult Case Management Discharge Plan :  Will you be returning to the same living situation after discharge: Yes,  home to live with her mother until [facility] bed becomes available. At discharge, do you have transportation home?:Yes,  bus pass Do you have the ability to pay for your medications:Yes,  [facility]  Release of information consent forms completed and in the chart;  Patient's signature needed at discharge.  Patient to Follow up at: Follow-up Information   Follow up with [facility]. (Walk in between 8AM-3PM for hospital follow up/medication managment/assessment for therapy services. )    Contact information:   [address] [place], [place] [address] Phone: [phone] Fax: [phone]      Follow up with [facility]. (Call [facility] daily at 9:15AM to check for bed availability. Contact person: Lula Olszewski)    Contact information:   [address] [place], [place] [address] Phone: [phone] Fax: [phone]      Patient denies SI/HI:   Yes,  during group/self report.    Safety Planning and Suicide Prevention discussed:  Yes,  SPE completed with pt's son. SPI pamphlet provided to pt and she was encouraged to share with her support network.   Smart, Sabel Hornbeck 08/29/2013, 11:28 AM

## 2013-08-29 NOTE — Progress Notes (Signed)
Recreation Therapy Notes  Date: 10.02.2014 Time: 3:00pm Location: [address] Dayroom   Group Topic: Communication, Team Building, Problem Solving  Goal Area(s) Addresses:  Patient will effectively work with peers towards shared goal.  Patient will identify skill used to make activity successful.  Patient will identify how skills used during activity can be used to reach post d/c goals.   Behavioral Response: Engaged, Attentive, Appropriate, Encouraging  Intervention: Problem Solving Activity  Activity: Landing Pad. In teams patients were given 12 plastic drinking straws and a length of masking tape. Using the materials provided patients were asked to build a landing pad to catch a golf ball dropped from approximately 6 feet in the air.   Education: Special educational needs teacher, Team Work, Building control surveyor.   Education Outcome: Acknowledges understanding.   Clinical Observations/Feedback: Patient actively engaged in group activity, working well with her team. Patient offered suggestions to team mates, as well as listening to their ideas. Additionally patient assisted in building team landing pad. Patient encouraged peer engagement in activity. Patient made no contributions to group discussion, but appeared to actively listen as she maintained appropriate eye contact with speaker.   Marykay Lex Waverley Krempasky, LRT/CTRS  Kataleah Bejar L 08/29/2013 8:38 AM

## 2013-08-29 NOTE — Discharge Summary (Signed)
Physician Discharge Summary Note  Patient:  Diana Santos is an 52 y.o., female MRN:  [id number] DOB:  Sep 04, 1961 Patient phone:  [phone] (home)  Patient address:   [address] [place] [address],   Date of Admission:  08/26/2013 Date of Discharge: 08/29/13  Reason for Admission: Alcohol detox  Discharge Diagnoses: Active Problems:   Alcohol dependence   Cocaine dependence   Severe major depression with psychotic features  Review of Systems  Constitutional: Negative.   HENT: Negative.   Eyes: Negative.   Respiratory: Negative.   Cardiovascular: Negative.   Gastrointestinal: Negative.   Genitourinary: Negative.   Musculoskeletal: Negative.   Skin: Negative.   Neurological: Negative.   Endo/Heme/Allergies: Negative.   Psychiatric/Behavioral: Positive for depression (Stabilized with medication prior to discharge) and substance abuse (Alcoholism, Cocaine dependence). Negative for suicidal ideas, hallucinations and memory loss. The patient is nervous/anxious (Stabilized with medication prior to discharge) and has insomnia (Stabilized with medication prior to discharge).     DSM5: Schizophrenia Disorders:  NA Obsessive-Compulsive Disorders:  NA Trauma-Stressor Disorders:  NA Substance/Addictive Disorders:  Alcohol Related Disorder - Severe (303.90) and Cocaine dependence Disorder - Severe (304.30) Depressive Disorders:  Severe major depression with psychotic features   Axis Diagnosis:  AXIS I:  Alcohol dependence, Cocaine Severe major depression with psychotic features AXIS II:  Deferred AXIS III:   Past Medical History  Diagnosis Date  . Diabetes mellitus without complication   . Hypertension   . Asthma   . Arthritis   . Bipolar 1 disorder, depressed    AXIS IV:  Polysubstance dependence AXIS V:  61  Level of Care:  [facility]  Hospital Course: Does not feel like living anymore. States she started feeling depressed, started drinking. States there is nothing  going right in her life. She stop taking her medication. Drinks what ever she can get beer, wine, liquor. Has had something to drink everyday for the last several weeks. States she experiences shakes, sweats. States she is tired of the way her life is going. States that she has been using crack last two weeks has been in and out cars trying to get more. When she came down called her son. She walked all night. She brought her here.  While a patient is this hospital, Diana Santos received both Librium detoxification treatment protocols to re-stabilized her systems of both alcohol and drug intoxications. She was also enrolled in group counseling sessions and activities where she counseled, taught and learned coping skills that should help her after discharge to cope better and manage her substance abuse issues to maintain sobriety.  She also received medication management and monitoring for his other depressive mood symptoms. She was ordered and received Fluoxetine 10 mg daily for depression, Hydroxyzine 50 mg at bedtime for anxiety and Haldol 0.5 mg Q bedtime for mood control. Diana Santos presented other health issues that required treatment and or monitoring. She received medication management and monitoring for those health issues. She was monitored closely for any potential problems that may arise as a result of her treatments. She tolerated her treatment regimen without any significant adverse effects and or reactions reported.   Diana Santos has successfully completed her detoxifcation treatments. She is currently being discharged to follow-up care at the [facility] in [facility] for medication management and routine psychiatric care. She has been informed that this is a walk-in appointment between the hours of 08:00: am to 03:00 PM, Monday thru Friday. She is also on a wait list to  go to [facility] when a bed becomes available. Wonder is instructed to call [facility] at around 09:15 am daily for bed availability. And if a  bed becomes available, she will need to move in and start her substance abuse treatment.The addresses, dates, times and contact information for all [facility] and [facility] appointment provided for her in writing.  Upon discharge, patient adamantly denies any suicidal, homicidal ideations, delusional thought, auditory, visual hallucinations and or withdrawal symptoms. She received 4 days worth supply sample of her [facility] discharge medications. Transportation per city bus. Bus pass provided by [facility].  Consults:  psychiatry  Significant Diagnostic Studies:  labs: CBC with diff, CMP, UDS, Toxicology tests, U/A  Discharge Vitals:   Blood pressure 112/77, pulse 84, temperature 97.7 F (36.5 C), temperature source Oral, resp. rate 18, height 5' 4.57" (1.64 m), weight 63.504 kg (140 lb), last menstrual period 06/30/2013. Body mass index is 23.61 kg/(m^2). Lab Results:   Results for orders placed during the hospital encounter of 08/26/13 (from the past 72 hour(s))  GLUCOSE, CAPILLARY     Status: Abnormal   Collection Time    08/26/13  8:47 PM      Result Value Range   Glucose-Capillary 186 (*) 70 - 99 mg/dL  GLUCOSE, CAPILLARY     Status: Abnormal   Collection Time    08/27/13  6:48 AM      Result Value Range   Glucose-Capillary 121 (*) 70 - 99 mg/dL  GLUCOSE, CAPILLARY     Status: Abnormal   Collection Time    08/27/13 12:08 PM      Result Value Range   Glucose-Capillary 192 (*) 70 - 99 mg/dL  GLUCOSE, CAPILLARY     Status: Abnormal   Collection Time    08/27/13  5:00 PM      Result Value Range   Glucose-Capillary 202 (*) 70 - 99 mg/dL   Comment 1 Notify RN    GLUCOSE, CAPILLARY     Status: Abnormal   Collection Time    08/27/13  9:52 PM      Result Value Range   Glucose-Capillary 255 (*) 70 - 99 mg/dL  GLUCOSE, CAPILLARY     Status: Abnormal   Collection Time    08/28/13  6:38 AM      Result Value Range   Glucose-Capillary 126 (*) 70 - 99 mg/dL  GLUCOSE, CAPILLARY     Status: Abnormal    Collection Time    08/28/13 11:52 AM      Result Value Range   Glucose-Capillary 191 (*) 70 - 99 mg/dL  GLUCOSE, CAPILLARY     Status: Abnormal   Collection Time    08/28/13  5:07 PM      Result Value Range   Glucose-Capillary 286 (*) 70 - 99 mg/dL  GLUCOSE, CAPILLARY     Status: Abnormal   Collection Time    08/28/13 10:06 PM      Result Value Range   Glucose-Capillary 114 (*) 70 - 99 mg/dL   Comment 1 Notify RN     Comment 2 Documented in Chart    GLUCOSE, CAPILLARY     Status: Abnormal   Collection Time    08/29/13  6:20 AM      Result Value Range   Glucose-Capillary 161 (*) 70 - 99 mg/dL   Comment 1 Notify RN     Comment 2 Documented in Chart    GLUCOSE, CAPILLARY     Status: Abnormal   Collection Time  08/29/13 11:57 AM      Result Value Range   Glucose-Capillary 174 (*) 70 - 99 mg/dL    Physical Findings: AIMS: Facial and Oral Movements Muscles of Facial Expression: None, normal Lips and Perioral Area: None, normal Jaw: None, normal Tongue: None, normal,Extremity Movements Upper (arms, wrists, hands, fingers): None, normal Lower (legs, knees, ankles, toes): None, normal, Trunk Movements Neck, shoulders, hips: None, normal, Overall Severity Severity of abnormal movements (highest score from questions above): None, normal Incapacitation due to abnormal movements: None, normal Patient's awareness of abnormal movements (rate only patient's report): No Awareness, Dental Status Current problems with teeth and/or dentures?: Yes (upper dentures) Does patient usually wear dentures?: No  CIWA:  CIWA-Ar Total: 0 COWS:     Psychiatric Specialty Exam: See Psychiatric Specialty Exam and Suicide Risk Assessment completed by Attending Physician prior to discharge.  Discharge destination:  Other:  Home first, then to RTC  Is patient on multiple antipsychotic therapies at discharge:  No   Has Patient had three or more failed trials of antipsychotic monotherapy by history:   No  Recommended Plan for Multiple Antipsychotic Therapies: NA     Medication List       Indication   albuterol (2.5 MG/3ML) 0.083% nebulizer solution  Commonly known as:  PROVENTIL  Take 3 mLs (2.5 mg total) by nebulization every 6 (six) hours as needed for wheezing.   Indication:  Acute Bronchospasm, Respiratory Distress Syndrome     FLUoxetine 10 MG capsule  Commonly known as:  PROZAC  Take 1 capsule (10 mg total) by mouth daily. For depression   Indication:  Depression     haloperidol 0.5 MG tablet  Commonly known as:  HALDOL  Take 1 tablet (0.5 mg total) by mouth daily. For mood control   Indication:  Mood control     hydrOXYzine 50 MG tablet  Commonly known as:  ATARAX/VISTARIL  Take 1 tablet (50 mg total) by mouth at bedtime as needed (sleep).   Indication:  Anxiety associated with Organic Disease     insulin aspart 100 UNIT/ML injection  Commonly known as:  novoLOG  Inject 4-15 Units into the skin daily. Diabetes management   Indication:  Type 2 Diabetes     insulin glargine 100 UNIT/ML injection  Commonly known as:  LANTUS  Inject 0.45 mLs (45 Units total) into the skin at bedtime. For diabetes management   Indication:  Type 2 Diabetes     lisinopril 10 MG tablet  Commonly known as:  PRINIVIL,ZESTRIL  Take 1 tablet (10 mg total) by mouth daily. For high blood pressure management   Indication:  High Blood Pressure     meloxicam 7.5 MG tablet  Commonly known as:  MOBIC  Take 1 tablet (7.5 mg total) by mouth daily. For muscle pains   Indication:  Joint Damage causing Pain and Loss of Function, Muscle pains     metFORMIN 1000 MG tablet  Commonly known as:  GLUCOPHAGE  Take 1 tablet (1,000 mg total) by mouth 2 (two) times daily with a meal. For diabetes management   Indication:  Type 2 Diabetes     omeprazole 40 MG capsule  Commonly known as:  PRILOSEC  Take 1 capsule (40 mg total) by mouth daily. For acid reflux   Indication:  Gastroesophageal Reflux  Disease with Current Symptoms     simvastatin 40 MG tablet  Commonly known as:  ZOCOR  Take 1 tablet (40 mg total) by mouth every evening. For high  Cholesterol control   Indication:  Inherited Homozygous Hypercholesterolemia, Nonfamilial Heterozygous Hypercholesterolemia       Follow-up Information   Follow up with [facility]. (Walk in between 8AM-3PM for hospital follow up/medication managment/assessment for therapy services. )    Contact information:   [address] [place], [place] [address] Phone: [phone] Fax: [phone]      Follow up with [facility]. (Call [facility] daily at 9:15AM to check for bed availability. Contact person: Lula Olszewski)    Contact information:   [address] [place], [place] [address] Phone: [phone] Fax: [phone]     Follow-up recommendations:  Activity:  As tolerated Diet: As recommended by your primary care doctor. Keep all scheduled follow-up appointments as recommended.  Comments: Take all your medications as prescribed by your mental healthcare provider. Report any adverse effects and or reactions from your medicines to your outpatient provider promptly. Patient is instructed and cautioned to not engage in alcohol and or illegal drug use while on prescription medicines. In the event of worsening symptoms, patient is instructed to call the crisis hotline, 911 and or go to the nearest ED for appropriate evaluation and treatment of symptoms. Follow-up with your primary care provider for your other medical issues, concerns and or health care needs.     Total Discharge Time:  Greater than 30 minutes.  Signed: Sanjuana Kava, PMHNP-BC, FNP 08/29/2013, 5:07 PM

## 2013-08-29 NOTE — [facility] Group Notes (Signed)
[facility] LCSW Aftercare Discharge Planning Group Note   08/29/2013 10:18 AM  Participation Quality:  Appropriate   Mood/Affect:  Appropriate  Depression Rating:  2  Anxiety Rating:  5  Thoughts of Suicide:  No Will you contract for safety?   NA  Current AVH:  No  Plan for Discharge/Comments:  Pt reports that her mother will take her in for a few days until bed becomes available at [facility]. Pt will follow up at [facility] for med management and therapy services. Pt in need of bus pass and Part bus $.   Transportation Means: bus or son  Supports: son, mother  Counselling psychologist, Herbert Seta

## 2013-09-02 NOTE — Progress Notes (Signed)
Patient Discharge Instructions:  After Visit Summary (AVS):   Faxed to:  09/02/13 Discharge Summary Note:   Faxed to:  09/02/13 Psychiatric Admission Assessment Note:   Faxed to:  09/02/13 Suicide Risk Assessment - Discharge Assessment:   Faxed to:  09/02/13 Faxed/Sent to the Next Level Care provider:  09/02/13 Faxed to [facility] @ [phone] Faxed to [facility] @ [phone]  Jerelene Redden, 09/02/2013, 3:58 PM

## 2013-09-16 NOTE — Discharge Summary (Signed)
Reviewed

## 2014-04-06 ENCOUNTER — Inpatient Hospital Stay (HOSPITAL_COMMUNITY)
Admission: AD | Admit: 2014-04-06 | Discharge: 2014-04-13 | DRG: 885 | Disposition: A | Discharge status: Home / Self Care | Payer: Medicaid Other | Source: Intra-hospital | Attending: Psychiatry | Admitting: Psychiatry

## 2014-04-06 ENCOUNTER — Encounter (HOSPITAL_COMMUNITY): Payer: Self-pay | Admitting: Emergency Medicine

## 2014-04-06 ENCOUNTER — Emergency Department (HOSPITAL_COMMUNITY)
Admission: EM | Admit: 2014-04-06 | Discharge: 2014-04-06 | Disposition: A | Discharge status: Other Acute Inpatient Hospital | Payer: Medicaid Other | Attending: Emergency Medicine | Admitting: Emergency Medicine

## 2014-04-06 DIAGNOSIS — M129 Arthropathy, unspecified: Secondary | ICD-10-CM | POA: Diagnosis present

## 2014-04-06 DIAGNOSIS — E119 Type 2 diabetes mellitus without complications: Secondary | ICD-10-CM | POA: Insufficient documentation

## 2014-04-06 DIAGNOSIS — F17200 Nicotine dependence, unspecified, uncomplicated: Secondary | ICD-10-CM | POA: Insufficient documentation

## 2014-04-06 DIAGNOSIS — Z794 Long term (current) use of insulin: Secondary | ICD-10-CM | POA: Insufficient documentation

## 2014-04-06 DIAGNOSIS — F1994 Other psychoactive substance use, unspecified with psychoactive substance-induced mood disorder: Secondary | ICD-10-CM | POA: Diagnosis present

## 2014-04-06 DIAGNOSIS — Z598 Other problems related to housing and economic circumstances: Secondary | ICD-10-CM

## 2014-04-06 DIAGNOSIS — J45909 Unspecified asthma, uncomplicated: Secondary | ICD-10-CM | POA: Diagnosis present

## 2014-04-06 DIAGNOSIS — F323 Major depressive disorder, single episode, severe with psychotic features: Secondary | ICD-10-CM | POA: Diagnosis present

## 2014-04-06 DIAGNOSIS — F1420 Cocaine dependence, uncomplicated: Secondary | ICD-10-CM | POA: Diagnosis present

## 2014-04-06 DIAGNOSIS — R45851 Suicidal ideations: Secondary | ICD-10-CM | POA: Insufficient documentation

## 2014-04-06 DIAGNOSIS — F1020 Alcohol dependence, uncomplicated: Secondary | ICD-10-CM | POA: Diagnosis present

## 2014-04-06 DIAGNOSIS — K219 Gastro-esophageal reflux disease without esophagitis: Secondary | ICD-10-CM | POA: Diagnosis present

## 2014-04-06 DIAGNOSIS — G4700 Insomnia, unspecified: Secondary | ICD-10-CM | POA: Diagnosis present

## 2014-04-06 DIAGNOSIS — I10 Essential (primary) hypertension: Secondary | ICD-10-CM | POA: Diagnosis present

## 2014-04-06 DIAGNOSIS — Z791 Long term (current) use of non-steroidal anti-inflammatories (NSAID): Secondary | ICD-10-CM | POA: Insufficient documentation

## 2014-04-06 DIAGNOSIS — E7800 Pure hypercholesterolemia, unspecified: Secondary | ICD-10-CM | POA: Diagnosis present

## 2014-04-06 DIAGNOSIS — Z5987 Material hardship due to limited financial resources, not elsewhere classified: Secondary | ICD-10-CM

## 2014-04-06 DIAGNOSIS — Z79899 Other long term (current) drug therapy: Secondary | ICD-10-CM | POA: Insufficient documentation

## 2014-04-06 DIAGNOSIS — F3130 Bipolar disorder, current episode depressed, mild or moderate severity, unspecified: Secondary | ICD-10-CM | POA: Insufficient documentation

## 2014-04-06 DIAGNOSIS — F410 Panic disorder [episodic paroxysmal anxiety] without agoraphobia: Secondary | ICD-10-CM | POA: Diagnosis present

## 2014-04-06 DIAGNOSIS — F329 Major depressive disorder, single episode, unspecified: Secondary | ICD-10-CM

## 2014-04-06 DIAGNOSIS — Z8739 Personal history of other diseases of the musculoskeletal system and connective tissue: Secondary | ICD-10-CM | POA: Insufficient documentation

## 2014-04-06 DIAGNOSIS — Z23 Encounter for immunization: Secondary | ICD-10-CM

## 2014-04-06 LAB — CBC
HCT: 34.8 % — ABNORMAL LOW (ref 36.0–46.0)
Hemoglobin: 11.7 g/dL — ABNORMAL LOW (ref 12.0–15.0)
MCH: 28.5 pg (ref 26.0–34.0)
MCHC: 33.6 g/dL (ref 30.0–36.0)
MCV: 84.7 fL (ref 78.0–100.0)
Platelets: 324 10*3/uL (ref 150–400)
RBC: 4.11 MIL/uL (ref 3.87–5.11)
RDW: 14.3 % (ref 11.5–15.5)
WBC: 9.7 10*3/uL (ref 4.0–10.5)

## 2014-04-06 LAB — COMPREHENSIVE METABOLIC PANEL
ALT: 18 U/L (ref 0–35)
AST: 14 U/L (ref 0–37)
Albumin: 3.8 g/dL (ref 3.5–5.2)
Alkaline Phosphatase: 119 U/L — ABNORMAL HIGH (ref 39–117)
BUN: 9 mg/dL (ref 6–23)
CALCIUM: 9.7 mg/dL (ref 8.4–10.5)
CHLORIDE: 95 meq/L — AB (ref 96–112)
CO2: 28 meq/L (ref 19–32)
CREATININE: 0.74 mg/dL (ref 0.50–1.10)
GFR calc Af Amer: >90 mL/min (ref >90)
Glucose, Bld: 353 mg/dL — ABNORMAL HIGH (ref 70–99)
Potassium: 4.3 mEq/L (ref 3.7–5.3)
Sodium: 135 mEq/L — ABNORMAL LOW (ref 137–147)
Total Bilirubin: 0.3 mg/dL (ref 0.3–1.2)
Total Protein: 8.1 g/dL (ref 6.0–8.3)

## 2014-04-06 LAB — CBG MONITORING, ED
GLUCOSE-CAPILLARY: 356 mg/dL — AB (ref 70–99)
GLUCOSE-CAPILLARY: 341 mg/dL — AB (ref 70–99)
Glucose-Capillary: 72 mg/dL (ref 70–99)

## 2014-04-06 LAB — SALICYLATE LEVEL

## 2014-04-06 LAB — ACETAMINOPHEN LEVEL: Acetaminophen (Tylenol), Serum: <15 ug/mL (ref 10–30)

## 2014-04-06 LAB — ETHANOL

## 2014-04-06 MED ORDER — INSULIN ASPART 100 UNIT/ML ~~LOC~~ SOLN
0.0000 [IU] | SUBCUTANEOUS | Status: DC
  Administered 2014-04-06: 15 [IU] via SUBCUTANEOUS
  Filled 2014-04-06: qty 1

## 2014-04-06 MED ORDER — HALOPERIDOL 1 MG PO TABS: 0.5000 mg | ORAL_TABLET | Freq: Every day | ORAL | Status: DC

## 2014-04-06 MED ORDER — METFORMIN HCL 500 MG PO TABS
1000.0000 mg | ORAL_TABLET | Freq: Two times a day (BID) | ORAL | Status: DC
  Filled 2014-04-06: qty 2

## 2014-04-06 MED ORDER — NICOTINE 21 MG/24HR TD PT24
21.0000 mg | MEDICATED_PATCH | Freq: Every day | TRANSDERMAL | Status: DC
  Administered 2014-04-06: 21 mg via TRANSDERMAL
  Filled 2014-04-06: qty 1

## 2014-04-06 MED ORDER — SIMVASTATIN 40 MG PO TABS
40.0000 mg | ORAL_TABLET | Freq: Every evening | ORAL | Status: DC
  Administered 2014-04-06: 40 mg via ORAL
  Filled 2014-04-06 (×2): qty 1

## 2014-04-06 MED ORDER — ACETAMINOPHEN 325 MG PO TABS
650.0000 mg | ORAL_TABLET | ORAL | Status: DC | PRN
  Administered 2014-04-06: 650 mg via ORAL
  Filled 2014-04-06: qty 2

## 2014-04-06 MED ORDER — ALBUTEROL SULFATE (2.5 MG/3ML) 0.083% IN NEBU: 2.5000 mg | INHALATION_SOLUTION | Freq: Four times a day (QID) | RESPIRATORY_TRACT | Status: DC | PRN

## 2014-04-06 MED ORDER — FLUOXETINE HCL 10 MG PO CAPS: 10.0000 mg | ORAL_CAPSULE | Freq: Every day | ORAL | Status: DC

## 2014-04-06 MED ORDER — HALOPERIDOL 5 MG PO TABS
5.0000 mg | ORAL_TABLET | Freq: Two times a day (BID) | ORAL | Status: DC
  Administered 2014-04-06: 5 mg via ORAL
  Filled 2014-04-06: qty 1

## 2014-04-06 MED ORDER — HYDROXYZINE HCL 25 MG PO TABS: 50.0000 mg | ORAL_TABLET | Freq: Every evening | ORAL | Status: DC | PRN

## 2014-04-06 MED ORDER — BENZONATATE 100 MG PO CAPS
100.0000 mg | ORAL_CAPSULE | Freq: Once | ORAL | Status: AC
  Administered 2014-04-06: 100 mg via ORAL
  Filled 2014-04-06: qty 1

## 2014-04-06 MED ORDER — LORAZEPAM 1 MG PO TABS: 1.0000 mg | ORAL_TABLET | Freq: Three times a day (TID) | ORAL | Status: DC | PRN

## 2014-04-06 MED ORDER — LISINOPRIL 10 MG PO TABS: 10.0000 mg | ORAL_TABLET | Freq: Every day | ORAL | Status: DC

## 2014-04-06 MED ORDER — BENZTROPINE MESYLATE 1 MG PO TABS
1.0000 mg | ORAL_TABLET | Freq: Two times a day (BID) | ORAL | Status: DC
  Administered 2014-04-06: 1 mg via ORAL
  Filled 2014-04-06: qty 1

## 2014-04-06 MED ORDER — ONDANSETRON HCL 4 MG PO TABS: 4.0000 mg | ORAL_TABLET | Freq: Three times a day (TID) | ORAL | Status: DC | PRN

## 2014-04-06 MED ORDER — INSULIN GLARGINE 100 UNIT/ML ~~LOC~~ SOLN
50.0000 [IU] | Freq: Every day | SUBCUTANEOUS | Status: DC
  Administered 2014-04-06: 50 [IU] via SUBCUTANEOUS
  Filled 2014-04-06: qty 0.5

## 2014-04-06 MED ORDER — CITALOPRAM HYDROBROMIDE 20 MG PO TABS: 20.0000 mg | ORAL_TABLET | Freq: Every day | ORAL | Status: DC

## 2014-04-06 MED ORDER — INSULIN ASPART 100 UNIT/ML ~~LOC~~ SOLN: 4.0000 [IU] | Freq: Every day | SUBCUTANEOUS | Status: DC

## 2014-04-06 MED ORDER — METFORMIN HCL 500 MG PO TABS: 1000.0000 mg | ORAL_TABLET | Freq: Two times a day (BID) | ORAL | Status: DC

## 2014-04-06 NOTE — ED Notes (Signed)
[facility] CALLED [facility] HAS TO BE UNDER 250 PER POLICY. [facility] TAKEN 72

## 2014-04-06 NOTE — ED Notes (Signed)
Pt requesting medication for her cough stating that her cough is "irritating." Dr. Fonnie JarvisBednar made aware.

## 2014-04-06 NOTE — ED Notes (Signed)
MD at bedside. TTS AT BS. 

## 2014-04-06 NOTE — ED Provider Notes (Signed)
CSN: [id number]     Arrival date & time 04/06/14  1443 History  This chart was scribed for non-physician practitioner, Santiago GladHeather Maiya Kates, PA-C working with Juliet RudeNathan R. Rubin PayorPickering, MD by Greggory StallionKayla Andersen, ED scribe. This patient was seen in room WTR3/WLPT3 and the patient's care was started at 3:14 PM.   Chief Complaint  Patient presents with  . Medical Clearance   The history is provided by the patient. No language interpreter was used.   HPI Comments: Diana Santos is a 53 y.o. female with history of bipolar disorder and depression who presents to the Emergency Department complaining of depression and suicidal ideations that started last night. Pt states her plan is to jump off of a bridge or walk in front of a car. She has attempted suicide in the past. Denies HI. Pt drinks alcohol sometimes but denies recreational drug use. She currently takes haldol, celexa and cogentin daily.   Past Medical History  Diagnosis Date  . Diabetes mellitus without complication   . Hypertension   . Asthma   . Arthritis   . Bipolar 1 disorder, depressed    Past Surgical History  Procedure Laterality Date  . C section     No family history on file. History  Substance Use Topics  . Smoking status: Current Some Day Smoker    Types: Cigarettes  . Smokeless tobacco: Not on file  . Alcohol Use: 9.0 oz/week    15 Cans of beer per week     Comment: "2 weeks of biege drinking"    OB History   Grav Para Term Preterm Abortions TAB SAB Ect Mult Living                 Review of Systems  Psychiatric/Behavioral: Positive for suicidal ideas and dysphoric mood.  All other systems reviewed and are negative.  Allergies  Aspirin  Home Medications   Prior to Admission medications   Medication Sig Start Date End Date Taking? Authorizing Provider  albuterol (PROVENTIL) (2.5 MG/3ML) 0.083% nebulizer solution Take 3 mLs (2.5 mg total) by nebulization every 6 (six) hours as needed for wheezing. 08/29/13   Sanjuana KavaAgnes I  Nwoko, NP  FLUoxetine (PROZAC) 10 MG capsule Take 1 capsule (10 mg total) by mouth daily. For depression 08/29/13   Sanjuana KavaAgnes I Nwoko, NP  haloperidol (HALDOL) 0.5 MG tablet Take 1 tablet (0.5 mg total) by mouth daily. For mood control 08/29/13   Sanjuana KavaAgnes I Nwoko, NP  hydrOXYzine (ATARAX/VISTARIL) 50 MG tablet Take 1 tablet (50 mg total) by mouth at bedtime as needed (sleep). 08/29/13   Sanjuana KavaAgnes I Nwoko, NP  insulin aspart (NOVOLOG) 100 UNIT/ML injection Inject 4-15 Units into the skin daily. Diabetes management 08/29/13   Sanjuana KavaAgnes I Nwoko, NP  insulin glargine (LANTUS) 100 UNIT/ML injection Inject 0.45 mLs (45 Units total) into the skin at bedtime. For diabetes management 08/29/13   Sanjuana KavaAgnes I Nwoko, NP  lisinopril (PRINIVIL,ZESTRIL) 10 MG tablet Take 1 tablet (10 mg total) by mouth daily. For high blood pressure management 08/29/13   Sanjuana KavaAgnes I Nwoko, NP  meloxicam (MOBIC) 7.5 MG tablet Take 1 tablet (7.5 mg total) by mouth daily. For muscle pains 08/29/13   Sanjuana KavaAgnes I Nwoko, NP  metFORMIN (GLUCOPHAGE) 1000 MG tablet Take 1 tablet (1,000 mg total) by mouth 2 (two) times daily with a meal. For diabetes management 08/29/13   Sanjuana KavaAgnes I Nwoko, NP  omeprazole (PRILOSEC) 40 MG capsule Take 1 capsule (40 mg total) by mouth daily. For acid reflux 08/29/13  Sanjuana KavaAgnes I Nwoko, NP  simvastatin (ZOCOR) 40 MG tablet Take 1 tablet (40 mg total) by mouth every evening. For high Cholesterol control 08/29/13   Sanjuana KavaAgnes I Nwoko, NP   BP 141/106  Pulse 89  Temp(Src) 98.4 F (36.9 C) (Oral)  Resp 18  SpO2 99%  LMP 06/30/2013  Physical Exam  Nursing note and vitals reviewed. Constitutional: She appears well-developed and well-nourished.  HENT:  Head: Normocephalic and atraumatic.  Mouth/Throat: Oropharynx is clear and moist.  Eyes: EOM are normal. Pupils are equal, round, and reactive to light.  Neck: Normal range of motion. Neck supple.  Cardiovascular: Normal rate, regular rhythm and normal heart sounds.   Pulmonary/Chest: Effort normal and  breath sounds normal. She has no wheezes.  Musculoskeletal: Normal range of motion.  Neurological: She is alert.  Skin: Skin is warm and dry.  Psychiatric: Cognition and memory are normal. She exhibits a depressed mood. She expresses suicidal ideation. She expresses no homicidal ideation. She expresses suicidal plans. She expresses no homicidal plans.    ED Course  Procedures (including critical care time)  DIAGNOSTIC STUDIES: Oxygen Saturation is 99% on RA, normal by my interpretation.    COORDINATION OF CARE: 3:18 PM-Discussed treatment plan which includes lab work and speaking with psychiatry with pt at bedside and pt agreed to plan.   Labs Review Labs Reviewed  CBC - Abnormal; Notable for the following:    Hemoglobin 11.7 (*)    HCT 34.8 (*)    All other components within normal limits  COMPREHENSIVE METABOLIC PANEL - Abnormal; Notable for the following:    Sodium 135 (*)    Chloride 95 (*)    Glucose, Bld 353 (*)    Alkaline Phosphatase 119 (*)    All other components within normal limits  SALICYLATE LEVEL - Abnormal; Notable for the following:    Salicylate Lvl <2.0 (*)    All other components within normal limits  ACETAMINOPHEN LEVEL  ETHANOL  URINE RAPID DRUG SCREEN (HOSP PERFORMED)    Imaging Review No results found.   EKG Interpretation None      MDM   Final diagnoses:  None   Patient presenting with SI with plan.  TTS consulted.  Labs showing elevated blood glucose, but no evidence of DKA.  Labs otherwise unremarkable.  Patient placed on sliding scale insulin.  Home medications reordered.  Psych holding orders placed.  I personally performed the services described in this documentation, which was scribed in my presence. The recorded information has been reviewed and is accurate.  Santiago GladHeather Zacary Bauer, PA-C 04/06/14 [phone]

## 2014-04-06 NOTE — ED Provider Notes (Signed)
Pt agrees to transfer. [facility] 2200  Hurman HornJohn M Draden Cottingham, MD 04/08/14 2228

## 2014-04-06 NOTE — ED Notes (Signed)
MD at bedside. 

## 2014-04-06 NOTE — ED Notes (Signed)
Pt comes in c/o SI. States her plan is to jump off bridge.  Thoughts started last night. Pt states that she is depressed and unhappy.
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
## 2014-04-07 ENCOUNTER — Encounter (HOSPITAL_COMMUNITY): Payer: Self-pay | Admitting: *Deleted

## 2014-04-07 DIAGNOSIS — F1994 Other psychoactive substance use, unspecified with psychoactive substance-induced mood disorder: Secondary | ICD-10-CM

## 2014-04-07 DIAGNOSIS — F1020 Alcohol dependence, uncomplicated: Secondary | ICD-10-CM

## 2014-04-07 DIAGNOSIS — F3130 Bipolar disorder, current episode depressed, mild or moderate severity, unspecified: Secondary | ICD-10-CM

## 2014-04-07 DIAGNOSIS — F329 Major depressive disorder, single episode, unspecified: Secondary | ICD-10-CM | POA: Insufficient documentation

## 2014-04-07 DIAGNOSIS — R45851 Suicidal ideations: Secondary | ICD-10-CM

## 2014-04-07 LAB — GLUCOSE, CAPILLARY
Glucose-Capillary: 148 mg/dL — ABNORMAL HIGH (ref 70–99)
Glucose-Capillary: 169 mg/dL — ABNORMAL HIGH (ref 70–99)
Glucose-Capillary: 198 mg/dL — ABNORMAL HIGH (ref 70–99)
Glucose-Capillary: 203 mg/dL — ABNORMAL HIGH (ref 70–99)
Glucose-Capillary: 86 mg/dL (ref 70–99)

## 2014-04-07 MED ORDER — HALOPERIDOL 5 MG PO TABS
5.0000 mg | ORAL_TABLET | Freq: Two times a day (BID) | ORAL | Status: DC
  Administered 2014-04-07 – 2014-04-09 (×5): 5 mg via ORAL
  Filled 2014-04-07 (×8): qty 1

## 2014-04-07 MED ORDER — TRAZODONE HCL 100 MG PO TABS
100.0000 mg | ORAL_TABLET | Freq: Every day | ORAL | Status: DC
  Filled 2014-04-07 (×9): qty 1

## 2014-04-07 MED ORDER — MAGNESIUM HYDROXIDE 400 MG/5ML PO SUSP
30.0000 mL | Freq: Every day | ORAL | Status: DC | PRN
Start: 2014-04-07 — End: 2014-04-13

## 2014-04-07 MED ORDER — NICOTINE 21 MG/24HR TD PT24
21.0000 mg | MEDICATED_PATCH | Freq: Every day | TRANSDERMAL | Status: DC
  Administered 2014-04-07 – 2014-04-11 (×5): 21 mg via TRANSDERMAL
  Filled 2014-04-07 (×10): qty 1

## 2014-04-07 MED ORDER — METFORMIN HCL 500 MG PO TABS
1000.0000 mg | ORAL_TABLET | Freq: Two times a day (BID) | ORAL | Status: DC
  Administered 2014-04-07 – 2014-04-13 (×12): 1000 mg via ORAL
  Filled 2014-04-07 (×18): qty 2

## 2014-04-07 MED ORDER — INSULIN GLARGINE 100 UNIT/ML ~~LOC~~ SOLN
50.0000 [IU] | Freq: Every day | SUBCUTANEOUS | Status: DC
  Administered 2014-04-07 – 2014-04-10 (×4): 50 [IU] via SUBCUTANEOUS

## 2014-04-07 MED ORDER — INSULIN ASPART 100 UNIT/ML ~~LOC~~ SOLN
0.0000 [IU] | SUBCUTANEOUS | Status: DC
  Administered 2014-04-07: 3 [IU] via SUBCUTANEOUS

## 2014-04-07 MED ORDER — ALUM & MAG HYDROXIDE-SIMETH 200-200-20 MG/5ML PO SUSP
30.0000 mL | ORAL | Status: DC | PRN
  Administered 2014-04-10 – 2014-04-13 (×4): 30 mL via ORAL

## 2014-04-07 MED ORDER — LISINOPRIL 10 MG PO TABS
10.0000 mg | ORAL_TABLET | Freq: Every day | ORAL | Status: DC
  Administered 2014-04-07 – 2014-04-13 (×6): 10 mg via ORAL
  Filled 2014-04-07: qty 1
  Filled 2014-04-07: qty 2
  Filled 2014-04-07 (×8): qty 1

## 2014-04-07 MED ORDER — HYDROXYZINE HCL 50 MG PO TABS
50.0000 mg | ORAL_TABLET | Freq: Every evening | ORAL | Status: DC | PRN
  Administered 2014-04-10 – 2014-04-12 (×3): 50 mg via ORAL
  Filled 2014-04-07 (×2): qty 1

## 2014-04-07 MED ORDER — CITALOPRAM HYDROBROMIDE 20 MG PO TABS
20.0000 mg | ORAL_TABLET | Freq: Every day | ORAL | Status: DC
  Administered 2014-04-07 – 2014-04-09 (×3): 20 mg via ORAL
  Filled 2014-04-07 (×5): qty 1

## 2014-04-07 MED ORDER — BENZTROPINE MESYLATE 1 MG PO TABS
1.0000 mg | ORAL_TABLET | Freq: Two times a day (BID) | ORAL | Status: DC
  Administered 2014-04-07 – 2014-04-09 (×5): 1 mg via ORAL
  Filled 2014-04-07 (×8): qty 1

## 2014-04-07 MED ORDER — INSULIN ASPART 100 UNIT/ML ~~LOC~~ SOLN
0.0000 [IU] | Freq: Three times a day (TID) | SUBCUTANEOUS | Status: DC
Start: 2014-04-07 — End: 2014-04-13
  Administered 2014-04-07: 5 [IU] via SUBCUTANEOUS
  Administered 2014-04-08: 8 [IU] via SUBCUTANEOUS
  Administered 2014-04-09 – 2014-04-10 (×2): 2 [IU] via SUBCUTANEOUS
  Administered 2014-04-10 – 2014-04-11 (×3): 3 [IU] via SUBCUTANEOUS
  Administered 2014-04-12 (×3): 2 [IU] via SUBCUTANEOUS

## 2014-04-07 MED ORDER — PNEUMOCOCCAL VAC POLYVALENT 25 MCG/0.5ML IJ INJ: 0.5000 mL | INJECTION | INTRAMUSCULAR | Status: DC

## 2014-04-07 MED ORDER — ONDANSETRON HCL 4 MG PO TABS: 4.0000 mg | ORAL_TABLET | Freq: Three times a day (TID) | ORAL | Status: DC | PRN

## 2014-04-07 MED ORDER — SIMVASTATIN 40 MG PO TABS
40.0000 mg | ORAL_TABLET | Freq: Every evening | ORAL | Status: DC
  Administered 2014-04-07 – 2014-04-12 (×6): 40 mg via ORAL
  Filled 2014-04-07 (×8): qty 1

## 2014-04-07 NOTE — [facility] Group Notes (Signed)
[facility] LCSW Group Therapy  04/07/2014   1:15 PM   Type of Therapy:  Group Therapy  Participation Level:  Active  Participation Quality:  Attentive, Sharing and Supportive  Affect:  Depressed, Drowsy  Cognitive:  Alert and Oriented  Insight:  Developing/Improving and Engaged  Engagement in Therapy:  Developing/Improving and Engaged  Modes of Intervention:  Clarification, Confrontation, Discussion, Education, Exploration, Limit-setting, Orientation, Problem-solving, Rapport Building, Dance movement psychotherapisteality Testing, Socialization and Support  Summary of Progress/Problems: The topic for group therapy was feelings about diagnosis.  Pt actively participated in group discussion on their past and current diagnosis and how they feel towards this.  Pt also identified how society and family members judge them, based on their diagnosis as well as stereotypes and stigmas.  Pt shared that she was diagnosed with depression but was feeling sleepy today due to the medication.  Pt actively listened to group discussion but did not share any further.    Reyes IvanChelsea Horton, LCSW 04/07/2014  2:40 PM

## 2014-04-07 NOTE — Progress Notes (Signed)
Adult Psychoeducational Group Note  Date:  04/07/2014 Time:  11:17 PM  Group Topic/Focus:  Wrap-Up Group:   The focus of this group is to help patients review their daily goal of treatment and discuss progress on daily workbooks.  Participation Level:  Did Not Attend   Additional Comments:  Pt slept through group and the remainder of the evening.   Chundra Sauerwein A Dakayla Disanti 04/07/2014, 11:17 PM
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
## 2014-04-07 NOTE — Progress Notes (Signed)
Pt presented to the ED stating she was depressed and having thoughts to jump off a bridge.  She said she had actually gone to the bridge.  Pt states she is "tired of life".  She has multiple health issues such as insulin dependent diabetes, HTN, asthma, arthritis.  She also says she is constantly arguing with her daughter and son.  She also had a son that died 6 yrs ago and says she misses him.  She recently visited his grave.  She feels her living children don't care anything about her.  Pt says she has been abusing alcohol to deal with her problems.  She is currently staying with her mother, but she wants to find another place to live.  Pt was cooperative with the admission process and paperwork was signed.  Pt was provided a meal and water.  Safety checks initiated q15 minutes.

## 2014-04-07 NOTE — H&P (Signed)
Psychiatric Admission Assessment Adult  Patient Identification:  Diana Santos Date of Evaluation:  04/07/2014 Chief Complaint:  MARJOR DEPRESSIVE DISORDER,RECURRENT,SEVERE,WITH PSYCHOTIC FEATURES History of Present Illness: Patient was [facility], [facility] with alcohol intoxication and bipolar depression. 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 She has been fighting to get her disability and working with a Clinical research associatelawyer. She has been drinking, beer, couple of six packs a day, 3-4 times a week. She has suicidal ideations, "I had gone to a bridge and was about to jump off." , people stopped me and than took to my mother home and she talked to me. "I feel tired of life." Patient has had one other suicide attempt, a year, tried to walk in front of the car. Her current Stressors include getting arguments with her daughter 20(18) who lives with her and her mother.  Patient has a deceased son (124) who died 6 years ago, in a single car MVA. Pt had visited his grave site recently. Patient has another son 46(25) but says that neither sone nor daughter care about her. [place] for about two years but it sounds like she has no permanent home. She has [facility] in [place]. [facility] [address] [facility]. Patient denies any HI. She does see sometimes sees a shadow person, who is talking with her death.  Elements:  Location:  depression, alcohol . Quality:  poor. Severity:  acute. Timing:  multiple psychosocial stresses. Associated Signs/Synptoms: Depression Symptoms:  depressed mood, anhedonia, insomnia, psychomotor retardation, fatigue, feelings of worthlessness/guilt, difficulty concentrating, hopelessness, impaired memory, suicidal thoughts with specific plan, panic attacks, loss of energy/fatigue, weight  loss, decreased labido, decreased appetite, (Hypo) Manic Symptoms:  Distractibility, Impulsivity, Irritable Mood, Anxiety Symptoms:  Excessive Worry, Psychotic Symptoms:  Hallucinations: Auditory Visual PTSD Symptoms: NA Total Time spent with patient: 45 minutes  Psychiatric Specialty Exam: Physical Exam  Review of Systems  Eyes: Positive for blurred vision.  Respiratory: Positive for cough.   Gastrointestinal: Positive for heartburn.  Musculoskeletal: Positive for joint pain.  Neurological: Positive for tremors.  Psychiatric/Behavioral: Positive for depression, suicidal ideas and substance abuse. The patient is nervous/anxious.   All other systems reviewed and are negative.   Blood pressure 134/90, pulse 80, temperature 97.6 F (36.4 C), temperature source Oral, resp. rate 20, height 5\' 4"  (1.626 m), weight 59.875 kg (132 lb), last menstrual period 06/30/2013.Body mass index is 22.65 kg/(m^2).  General Appearance: Disheveled and Guarded  Eye Contact::  Minimal  Speech:  Clear and Coherent and Slow  Volume:  Decreased  Mood:  Anxious, Depressed, Hopeless and Worthless  Affect:  Depressed, Flat and Tearful  Thought Process:  Coherent and Intact  Orientation:  Full (Time, Place, and Person)  Thought Content:  Hallucinations: Auditory Visual and Paranoid Ideation  Suicidal Thoughts:  Yes.  with intent/plan  Homicidal Thoughts:  No  Memory:  Immediate;   Fair Recent;   Fair  Judgement:  Fair  Insight:  Lacking  Psychomotor Activity:  Decreased  Concentration:  Fair  Recall:  [employer]Fair  Fund of Knowledge:Fair  Language: Fair  Akathisia:  NA  Handed:  Right  AIMS (if indicated):     Assets:  Communication Skills Desire for Improvement Leisure Time Physical Health Resilience Social Support Talents/Skills  Sleep:  Number of Hours: 4.5    Musculoskeletal:  Strength & Muscle Tone: within normal limits Gait & Station: normal Patient leans: N/A  Past Psychiatric  History: Diagnosis: Bipolar disorder, alcohol dependence  Hospitalizations: [facility], [place] [facility]  Outpatient Care:[facility]  Substance Abuse Care: Cocaine and alcohol, [facility], [facility]  Self-Mutilation: NO  Suicidal Attempts: yes  Violent Behaviors: NO   Past Medical History:   Past Medical History  Diagnosis Date  . Diabetes mellitus without complication   . Hypertension   . Asthma   . Arthritis   . Bipolar 1 disorder, depressed    None. Allergies:   Allergies  Allergen Reactions  . Aspirin Anaphylaxis and Swelling   PTA Medications: Prescriptions prior to admission  Medication Sig Dispense Refill  . benztropine (COGENTIN) 1 MG tablet Take 1 mg by mouth 2 (two) times daily.      . citalopram (CELEXA) 20 MG tablet Take 20 mg by mouth daily.      . haloperidol (HALDOL) 5 MG tablet Take 5 mg by mouth 2 (two) times daily.      . insulin aspart (NOVOLOG) 100 UNIT/ML injection Inject 4-15 Units into the skin daily. Diabetes management  1 vial  12  . insulin glargine (LANTUS) 100 UNIT/ML injection Inject 50 Units into the skin at bedtime.      Marland Kitchen lisinopril (PRINIVIL,ZESTRIL) 10 MG tablet Take 1 tablet (10 mg total) by mouth daily. For high blood pressure management  30 tablet  0  . omeprazole (PRILOSEC) 20 MG capsule Take 20 mg by mouth daily.      . simvastatin (ZOCOR) 40 MG tablet Take 1 tablet (40 mg total) by mouth every evening. For high Cholesterol control  30 tablet    . metFORMIN (GLUCOPHAGE) 500 MG tablet Take 1,000 mg by mouth 2 (two) times daily with a meal.        Previous Psychotropic Medications:  Medication/Dose  Haldol  cogentin  Celexa           Substance Abuse History in the last 12 months:  yes  Consequences of Substance Abuse: Blackouts:   Withdrawal Symptoms:   Diaphoresis Tremors  Social History:  reports that she has been smoking Cigarettes.  She has been smoking about 0.00 packs per day. She does not have any smokeless tobacco history on  file. She reports that she drinks about 9 ounces of alcohol per week. She reports that she uses illicit drugs (Cocaine). Additional Social History: Pain Medications: See home med list Prescriptions: see home med list Over the Counter: See home med list History of alcohol / drug use?: Yes Name of Substance 1: alcohol 1 - Age of First Use: teens 1 - Amount (size/oz): varies 1 - Frequency: "almost daily" 1 - Duration: ongoing 1 - Last Use / Amount: 04/05/14                  Current Place of Residence:   Place of Birth:   Family Members: Marital Status:  Single Children:  Sons:  Daughters: Relationships: Education:  HS Print production planner Problems/Performance: Religious Beliefs/Practices: History of Abuse (Emotional/Phsycial/Sexual) Occupational Experiences; Military History:  None. Legal History: Hobbies/Interests:  Family History:  History reviewed. No pertinent family history.  Results for orders placed during the hospital encounter of 04/06/14 (from the past 72 hour(s))  GLUCOSE, CAPILLARY     Status: Abnormal   Collection Time    04/07/14 12:49 AM      Result Value Ref Range   Glucose-Capillary 169 (*) 70 - 99 mg/dL  Comment 1 Notify RN    GLUCOSE, CAPILLARY     Status: Abnormal   Collection Time    04/07/14  6:23 AM      Result Value Ref Range   Glucose-Capillary 198 (*) 70 - 99 mg/dL   Psychological Evaluations:  Assessment:   DSM5:  Schizophrenia Disorders:   Obsessive-Compulsive Disorders:   Trauma-Stressor Disorders:   Substance/Addictive Disorders:   Depressive Disorders:    AXIS I:  Bipolar, Depressed, Substance Induced Mood Disorder and alcohol dependence  AXIS II:  Deferred AXIS III:   Past Medical History  Diagnosis Date  . Diabetes mellitus without complication   . Hypertension   . Asthma   . Arthritis   . Bipolar 1 disorder, depressed    AXIS IV:  economic problems, housing problems, occupational problems, other psychosocial or  environmental problems, problems related to social environment and problems with primary support group AXIS V:  41-50 serious symptoms  Treatment Plan/Recommendations:  Admit for alcohol detox and depression  Treatment Plan Summary: Daily contact with patient to assess and evaluate symptoms and progress in treatment Medication management Current Medications:  Current Facility-Administered Medications  Medication Dose Route Frequency Provider Last Rate Last Dose  . alum & mag hydroxide-simeth (MAALOX/MYLANTA) 200-200-20 MG/5ML suspension 30 mL  30 mL Oral Q4H PRN Nanine Means, NP      . benztropine (COGENTIN) tablet 1 mg  1 mg Oral BID Nanine Means, NP   1 mg at 04/07/14 1610  . citalopram (CELEXA) tablet 20 mg  20 mg Oral Daily Nanine Means, NP   20 mg at 04/07/14 9604  . haloperidol (HALDOL) tablet 5 mg  5 mg Oral BID Nanine Means, NP   5 mg at 04/07/14 5409  . hydrOXYzine (ATARAX/VISTARIL) tablet 50 mg  50 mg Oral QHS PRN Nanine Means, NP      . insulin aspart (novoLOG) injection 0-15 Units  0-15 Units Subcutaneous 6 times per day Nanine Means, NP   3 Units at 04/07/14 0647  . insulin glargine (LANTUS) injection 50 Units  50 Units Subcutaneous QHS Nanine Means, NP      . lisinopril (PRINIVIL,ZESTRIL) tablet 10 mg  10 mg Oral Daily Nanine Means, NP   10 mg at 04/07/14 0828  . magnesium hydroxide (MILK OF MAGNESIA) suspension 30 mL  30 mL Oral Daily PRN Nanine Means, NP      . metFORMIN (GLUCOPHAGE) tablet 1,000 mg  1,000 mg Oral BID WC Nanine Means, NP   1,000 mg at 04/07/14 0828  . nicotine (NICODERM CQ - dosed in mg/24 hours) patch 21 mg  21 mg Transdermal Daily Nanine Means, NP   21 mg at 04/07/14 0828  . ondansetron (ZOFRAN) tablet 4 mg  4 mg Oral Q8H PRN Nanine Means, NP      . Melene Muller ON 04/08/2014] pneumococcal 23 valent vaccine (PNU-IMMUNE) injection 0.5 mL  0.5 mL Intramuscular Tomorrow-1000 Nehemiah Settle, MD      . simvastatin (ZOCOR) tablet 40 mg  40 mg Oral QPM Nanine Means, NP      . traZODone (DESYREL) tablet 100 mg  100 mg Oral QHS Nanine Means, NP        Observation Level/Precautions:  15 minute checks  Laboratory:  Reviewed admission labs  Psychotherapy:  Group and milieu therapy  Medications:  Reviewed and will adjust as clinically required  Consultations:  none  Discharge Concerns:  safety  Estimated LOS: 5- days  Other:     I certify that  inpatient services furnished can reasonably be expected to improve the patient's condition.   Randal BubaJanardhaha R Tecora Eustache 5/12/20158:56 AM

## 2014-04-07 NOTE — Progress Notes (Signed)
Pt has been in bed all evening.  Pt reports she has been tired most of the day and attributes this to the medications.  Pt says she did go to meals, but no groups.  Pt says she is still having passive thoughts to harm herself, but she contracts for safety.  She denies HI/AV.  She is not sure what [facility].  Pt was encouraged to try to participate tomorrow.  Pt makes her needs known to staff.  Support and encouragement offered.  Safety maintained with q15 minute checks.

## 2014-04-07 NOTE — ED Provider Notes (Signed)
Medical screening examination/treatment/procedure(s) were performed by non-physician practitioner and as supervising physician I was immediately available for consultation/collaboration.   EKG Interpretation None       Canda Podgorski R. Floyd Wade, MD 04/07/14 0042 

## 2014-04-07 NOTE — [facility] Counselor (Signed)
Adult Psychosocial Assessment Update Interdisciplinary Team  Previous [facility] Health Hospital admissions/discharges:  Admissions Discharges  Date: 08/26/13 Date: 08/29/13  Date: Date:  Date: Date:  Date: Date:  Date: Date:   Changes since the last Psychosocial Assessment (including adherence to outpatient mental health and/or substance abuse treatment, situational issues contributing to decompensation and/or relapse). Pt admitted to the hospital for depression and SI, with a plan to jump off a bridge.  Pt reports current stressors are getting into an argument with her daughter and grieving the loss of her son.  On last admission pt [facility] but [facility].  Pt reports going to [facility] for outpatient medication management and therapy.  Pt reports drinking 2-3 40 oz of beer and 1/2 pint of liquor daily and $100 worth of cocaine 1 time per week for the last 6 months.               Discharge Plan 1. Will you be returning to the same living situation after discharge?   Yes: X [place].   No:      If no, what is your plan?           2. Would you like a referral for services when you are discharged? Yes:  X   If yes, for what services? [facility].  No:              Summary and Recommendations (to be completed by the evaluator) Patient is a 53 year old African American Female with a diagnosis of Major Depression, Recurrent severe.  [place].  Patient will benefit from crisis stabilization, medication evaluation, group therapy and psycho education in addition to case management for discharge planning.                         Signature:  Dolores PattyChelsea N Horton, 04/07/2014 8:41 AM

## 2014-04-07 NOTE — Progress Notes (Signed)
D:  Per pt self inventory pt reports sleeping well, appetite good, energy level low, ability to pay attention poor, rates depression at a 10 out of 10 and hopelessness at a  10 out of 10, endorses SI on and off no plan, denies HI/AVH today, pt has been sleeping/isolating in room today.  A:  Emotional support provided, Encouraged pt to continue with treatment plan and attend all group activities, q15 min checks maintained for safety.  R:  Pt is not going to groups due to feeling very sleepy, pt has been pleasant and cooperative otherwise towards staff and other patients, pt did go to cafeteria this evening for dinner.

## 2014-04-07 NOTE — [facility] Group Notes (Signed)
Adult Psychoeducational Group Note  Date:  04/07/2014 Time:  0900am  Group Topic/Focus:  Goals Group:   The focus of this group is to help patients establish daily goals to achieve during treatment and discuss how the patient can incorporate goal setting into their daily lives to aide in recovery. Orientation:   The focus of this group is to educate the patient on the purpose and policies of crisis stabilization and provide a format to answer questions about their admission.  The group details unit policies and expectations of patients while admitted.  Participation Level:  Did Not Attend  Alfonse Sprucendrea Brooke Thorne 04/07/2014, 1:18 PM

## 2014-04-07 NOTE — Tx Team (Signed)
Initial Interdisciplinary Treatment Plan  PATIENT STRENGTHS: (choose at least two) Ability for insight Average or above average intelligence Capable of independent living Communication skills General fund of knowledge  PATIENT STRESSORS: Financial difficulties Health problems Marital or family conflict Substance abuse   PROBLEM LIST: Problem List/Patient Goals Date to be addressed Date deferred Reason deferred Estimated date of resolution  Alcohol abuse      Depression related to conflict with her children      Grief/loss related to loss of her son 6 yrs ago      Risk for self harm-thoughts to jump off a bridge      Declining health- diabetes and arthritis                               DISCHARGE CRITERIA:  Ability to meet basic life and health needs Adequate post-discharge living arrangements Improved stabilization in mood, thinking, and/or behavior Motivation to continue treatment in a less acute level of care Verbal commitment to aftercare and medication compliance Withdrawal symptoms are absent or subacute and managed without 24-hour nursing intervention  PRELIMINARY DISCHARGE PLAN: Attend aftercare/continuing care group Attend 12-step recovery group Outpatient therapy Placement in alternative living arrangements  PATIENT/FAMIILY INVOLVEMENT: This treatment plan has been presented to and reviewed with the patient, Diana Santos, and/or family member.  The patient and family have been given the opportunity to ask questions and make suggestions.  Diana Santos 04/07/2014, 1:21 AM

## 2014-04-08 DIAGNOSIS — E119 Type 2 diabetes mellitus without complications: Secondary | ICD-10-CM | POA: Diagnosis present

## 2014-04-08 DIAGNOSIS — F3130 Bipolar disorder, current episode depressed, mild or moderate severity, unspecified: Secondary | ICD-10-CM

## 2014-04-08 LAB — GLUCOSE, CAPILLARY
GLUCOSE-CAPILLARY: 69 mg/dL — AB (ref 70–99)
GLUCOSE-CAPILLARY: 255 mg/dL — AB (ref 70–99)
GLUCOSE-CAPILLARY: 80 mg/dL (ref 70–99)
Glucose-Capillary: 91 mg/dL (ref 70–99)

## 2014-04-08 MED ORDER — ACETAMINOPHEN 500 MG PO TABS
1000.0000 mg | ORAL_TABLET | Freq: Four times a day (QID) | ORAL | Status: DC | PRN
  Administered 2014-04-08 – 2014-04-13 (×6): 1000 mg via ORAL
  Filled 2014-04-08 (×6): qty 2

## 2014-04-08 MED ORDER — CHLORDIAZEPOXIDE HCL 25 MG PO CAPS
25.0000 mg | ORAL_CAPSULE | Freq: Four times a day (QID) | ORAL | Status: AC | PRN
  Administered 2014-04-08 (×2): 25 mg via ORAL
  Filled 2014-04-08 (×2): qty 1

## 2014-04-08 MED ORDER — BENZONATATE 100 MG PO CAPS
100.0000 mg | ORAL_CAPSULE | Freq: Three times a day (TID) | ORAL | Status: DC | PRN
  Administered 2014-04-08: 100 mg via ORAL
  Filled 2014-04-08: qty 1

## 2014-04-08 NOTE — Progress Notes (Signed)
Recreation Therapy Notes  Animal-Assisted Activity/Therapy (AAA/T) Program Checklist/Progress Notes Patient Eligibility Criteria Checklist & Daily Group note for Rec Tx Intervention  Date: 05.12.2015 Time: 2:45pm Location: [address] Dayroom   AAA/T Program Assumption of Risk Form signed by Patient/ or Parent Legal Guardian yes  Patient is free of allergies or sever asthma yes  Patient reports no fear of animals yes  Patient reports no history of cruelty to animals yes   Patient understands his/her participation is voluntary yes  Patient washes hands before animal contact yes  Patient washes hands after animal contact yes  Behavioral Response: DID NOT ATTEND  Taiana Temkin L Zaharah Amir, LRT/CTRS  Kaelah Hayashi L Shanyiah Conde 04/08/2014 8:20 AM 

## 2014-04-08 NOTE — Progress Notes (Signed)
Pt has been in bed most of the day.  She c/o "not feeling well.she rated her depression and anxiety a 10 and denied feeling hopeless on her self-inventory.  She denies any H/I or A/V/H but has some passive S/I but no plans here the hospital and verbally contracts to come to staff before acting out. [facility]

## 2014-04-08 NOTE — Progress Notes (Signed)
[facility] MD Progress Note  04/08/2014 1:04 PM Diana Santos  MRN:  [id number] Subjective:  Met with patient today to discuss her current response to treatment. Today she states she feels shakey due to her low blood sugars. Reports her last hgbA1c was 11.  She was able to eat some lunch.  She says her depression is "coming along."  Says it's a little better, as she hasn't felt like killing herself today. She denies any alcohol on the day of admission, but states she was drinking 3-4 40's 4-5 x a week. She denies any blackouts or seizures.  Patient reports headache, blurred vision, tremors, nausea. Diagnosis:   DSM5: Schizophrenia Disorders:   Obsessive-Compulsive Disorders:   Trauma-Stressor Disorders:   Substance/Addictive Disorders:  Alcohol Withdrawal (291.81) Depressive Disorders:  Major Depressive Disorder - with Psychotic Features (296.24) Total Time spent with patient: 20 minutes  Axis I: Bipolar, Depressed  ADL's:  Intact  Sleep: Fair  Appetite:  Poor  Suicidal Ideation:  denies Homicidal Ideation:  denies AEB (as evidenced by):  Psychiatric Specialty Exam: Physical Exam  ROS  Blood pressure 93/65, pulse 76, temperature 97.4 F (36.3 C), temperature source Oral, resp. rate 18, height 5\' 4"  (1.626 m), weight 59.875 kg (132 lb), last menstrual period 06/30/2013.Body mass index is 22.65 kg/(m^2).  General Appearance: Disheveled  Eye Contact::  Poor  Speech:  Slow  Volume:  Decreased  Mood:  Depressed  Affect:  Flat  Thought Process:  Goal Directed  Orientation:  Full (Time, Place, and Person)  Thought Content:  Hallucinations: Auditory  Suicidal Thoughts:  No  Homicidal Thoughts:  No  Memory:  NA  Judgement:  Impaired  Insight:  Shallow  Psychomotor Activity:  Tremor  Concentration:  Poor  Recall:  Poor  Fund of Knowledge:Fair  Language: Good  Akathisia:  No  Handed:  Right  AIMS (if indicated):     Assets:  Communication Skills Desire for Improvement  Sleep:   Number of Hours: 6.75   Musculoskeletal: Strength & Muscle Tone: within normal limits Gait & Station: unsteady Patient leans: Front  Current Medications: Current Facility-Administered Medications  Medication Dose Route Frequency Provider Last Rate Last Dose  . alum & mag hydroxide-simeth (MAALOX/MYLANTA) 200-200-20 MG/5ML suspension 30 mL  30 mL Oral Q4H PRN 06-05-2001, NP      . benztropine (COGENTIN) tablet 1 mg  1 mg Oral BID Nanine Means, NP   1 mg at 04/08/14 0849  . citalopram (CELEXA) tablet 20 mg  20 mg Oral Daily 04/10/14, NP   20 mg at 04/08/14 0849  . haloperidol (HALDOL) tablet 5 mg  5 mg Oral BID 04/10/14, NP   5 mg at 04/08/14 0849  . hydrOXYzine (ATARAX/VISTARIL) tablet 50 mg  50 mg Oral QHS PRN 04/10/14, NP      . insulin aspart (novoLOG) injection 0-15 Units  0-15 Units Subcutaneous TID AC & HS 01-26-1985, FNP   5 Units at 04/07/14 1728  . insulin glargine (LANTUS) injection 50 Units  50 Units Subcutaneous QHS 06/07/14, NP   50 Units at 04/07/14 2201  . lisinopril (PRINIVIL,ZESTRIL) tablet 10 mg  10 mg Oral Daily 2202, NP   10 mg at 04/07/14 06/07/14  . magnesium hydroxide (MILK OF MAGNESIA) suspension 30 mL  30 mL Oral Daily PRN 1511, NP      . metFORMIN (GLUCOPHAGE) tablet 1,000 mg  1,000 mg Oral BID WC Nanine Means, NP   1,000 mg at  04/07/14 1728  . nicotine (NICODERM CQ - dosed in mg/24 hours) patch 21 mg  21 mg Transdermal Daily Waylan Boga, NP   21 mg at 04/08/14 0855  . ondansetron (ZOFRAN) tablet 4 mg  4 mg Oral Q8H PRN Waylan Boga, NP      . pneumococcal 23 valent vaccine (PNU-IMMUNE) injection 0.5 mL  0.5 mL Intramuscular Tomorrow-1000 Durward Parcel, MD      . simvastatin (ZOCOR) tablet 40 mg  40 mg Oral QPM Waylan Boga, NP   40 mg at 04/07/14 1729  . traZODone (DESYREL) tablet 100 mg  100 mg Oral QHS Waylan Boga, NP        Lab Results:  Results for orders placed during the hospital encounter of 04/06/14 (from  the past 48 hour(s))  GLUCOSE, CAPILLARY     Status: Abnormal   Collection Time    04/07/14 12:49 AM      Result Value Ref Range   Glucose-Capillary 169 (*) 70 - 99 mg/dL   Comment 1 Notify RN    GLUCOSE, CAPILLARY     Status: Abnormal   Collection Time    04/07/14  6:23 AM      Result Value Ref Range   Glucose-Capillary 198 (*) 70 - 99 mg/dL  GLUCOSE, CAPILLARY     Status: None   Collection Time    04/07/14 12:05 PM      Result Value Ref Range   Glucose-Capillary 86  70 - 99 mg/dL  GLUCOSE, CAPILLARY     Status: Abnormal   Collection Time    04/07/14  5:02 PM      Result Value Ref Range   Glucose-Capillary 203 (*) 70 - 99 mg/dL  GLUCOSE, CAPILLARY     Status: Abnormal   Collection Time    04/07/14  8:55 PM      Result Value Ref Range   Glucose-Capillary 148 (*) 70 - 99 mg/dL  GLUCOSE, CAPILLARY     Status: None   Collection Time    04/08/14  6:14 AM      Result Value Ref Range   Glucose-Capillary 91  70 - 99 mg/dL  GLUCOSE, CAPILLARY     Status: Abnormal   Collection Time    04/08/14 12:00 PM      Result Value Ref Range   Glucose-Capillary 69 (*) 70 - 99 mg/dL    Physical Findings: AIMS: Facial and Oral Movements Muscles of Facial Expression: None, normal Lips and Perioral Area: None, normal Jaw: None, normal Tongue: None, normal,Extremity Movements Upper (arms, wrists, hands, fingers): None, normal Lower (legs, knees, ankles, toes): None, normal, Trunk Movements Neck, shoulders, hips: None, normal, Overall Severity Severity of abnormal movements (highest score from questions above): None, normal Incapacitation due to abnormal movements: None, normal Patient's awareness of abnormal movements (rate only patient's report): No Awareness, Dental Status Current problems with teeth and/or dentures?: Yes (Has a dentist appt soon) Does patient usually wear dentures?: Yes (uppers)  CIWA:  CIWA-Ar Total: 4 COWS:     Treatment Plan Summary: Daily contact with patient to  assess and evaluate symptoms and progress in treatment Medication management  Plan: NEW: 1. Will add Librium $RemoveBef'25mg'QilmrOfZGh$  QID PRN x 2 days for withdrawal symptoms. 2. HgbA1c for type 2 DM.  1. Continue crisis management and stabilization. 2. Continue medication management by assessing side effects, dose modification as needed and therapeutic blood levels as indicated. 3. Treat health problems as indicated or consult IM as needed. 4. Continue treatment  plan to decrease risk of relapse upon discharge and to reduce the need for readmission to incorporate the use of local resources for support. 5. Psycho-social education regarding relapse prevention through good self care to incorporate diet, exercise, stress reduction, good sleep hygiene, and reduction in external risk factors such as alcohol, tobacco, and substance abuse. 6. Gain consent for contact with family, PCP, or outside health provider as needed. 7. Review home medications as needed. 8. Disposition is in progress.  Medical Decision Making Problem Points:  Established problem, stable/improving (1) and New problem, with additional work-up planned (4) Data Points:  Review or order clinical lab tests (1) Review of medication regiment & side effects (2)  I certify that inpatient services furnished can reasonably be expected to improve the patient's condition.   Marlane Hatcher. Mashburn RPAC 1:52 PM 04/08/2014  Patient was seen face-to-face for psychiatric evaluation, suicide risk assessment and the case discussed with her physician extender and the made appropriate disposition plan.Reviewed the information documented and agree with the treatment plan.  Parke Simmers Kathlynn Swofford 04/09/2014 12:02 PM

## 2014-04-08 NOTE — Progress Notes (Signed)
D:Patient in bed all shift.  Patient states she will try to get up more tomorrow.  Patient states she feels less shaky since she has been getting her Librium prn.  Patient forwards little and has not been visible on the unit. Patient denies SI/HI and denies AVH. A: Staff to monitor Q 15 mins for safety.  Encouragement and support offered.  Scheduled medications administered per orders.  Tessalon pearls administered prn for cough.  Tylenol administered prn gor knee pain.  Librium administered prn for withdrawals. R: Patient remains safe on the unit.  Patient did not attend group tonight.  Patient taking administered medications.  Patient not visible on the unit.

## 2014-04-08 NOTE — [facility] Group Notes (Signed)
[facility]   04/08/2014 1:01 PM  Participation Quality:    Did not attend group.    Donnita Farina Hairston Euclide Granito

## 2014-04-08 NOTE — [facility] Group Notes (Signed)
[facility] LCSW Group Therapy  Emotional Regulation 1:15 - 2: 30 PM        04/08/2014     Type of Therapy:  Group Therapy  Participation Level:  Did not attend group.    Wynn BankerHodnett, Tiffanyann Deroo Hairston 04/08/2014

## 2014-04-08 NOTE — Progress Notes (Signed)
Did not attended group 

## 2014-04-08 NOTE — Tx Team (Signed)
Interdisciplinary Treatment Plan Update (Adult)  Date: 04/08/2014  Time Reviewed:  9:45 AM  Progress in Treatment: Attending groups: Yes Participating in groups:  Yes Taking medication as prescribed:  Yes Tolerating medication:  Yes Family/Significant othe contact made: CSW assessing  Patient understands diagnosis:  Yes Discussing patient identified problems/goals with staff:  Yes Medical problems stabilized or resolved:  Yes Denies suicidal/homicidal ideation: Yes Issues/concerns per patient self-inventory:  Yes Other:  New problem(s) identified: N/A  Discharge Plan or Barriers: CSW assessing for appropriate referrals.  Reason for Continuation of Hospitalization: Anxiety Depression Medication Stabilization  Comments: N/A  Estimated length of stay: 3-5 days  For review of initial/current patient goals, please see plan of care.  Attendees: Patient:     Family:     Physician:  Dr. Johnalagadda 04/08/2014 9:45 AM   Nursing:   Vivian Kent, RN 04/08/2014 9:45 AM   Clinical Social Worker:  Kaylan Friedmann Horton, LCSW 04/08/2014 9:45 AM   Other: Neil Mashburn, PA 04/08/2014 9:45 AM   Other:  Valerie Noch, care coordination 04/08/2014 9:45 AM   Other:  Quylle Hodnett, LCSW 04/08/2014 9:45 AM   Other:  Roneicia Byrd, RN 04/08/2014 9:45 AM   Other: Delora Sutton, case coordination 04/08/2014 9:45 AM   Other:    Other:    Other:    Other:    Other:     Scribe for Treatment Team:   Horton, Takeshi Teasdale Nicole, 04/08/2014 9:45 AM   

## 2014-04-09 LAB — GLUCOSE, CAPILLARY
GLUCOSE-CAPILLARY: 128 mg/dL — AB (ref 70–99)
Glucose-Capillary: 49 mg/dL — ABNORMAL LOW (ref 70–99)
Glucose-Capillary: 77 mg/dL (ref 70–99)
Glucose-Capillary: 74 mg/dL (ref 70–99)
Glucose-Capillary: 119 mg/dL — ABNORMAL HIGH (ref 70–99)
Glucose-Capillary: 124 mg/dL — ABNORMAL HIGH (ref 70–99)

## 2014-04-09 LAB — HEMOGLOBIN A1C
Hgb A1c MFr Bld: 12.4 % — ABNORMAL HIGH (ref <5.7)
MEAN PLASMA GLUCOSE: 309 mg/dL — AB (ref <117)

## 2014-04-09 MED ORDER — HALOPERIDOL 5 MG PO TABS
5.0000 mg | ORAL_TABLET | Freq: Every day | ORAL | Status: DC
  Administered 2014-04-10 – 2014-04-12 (×3): 5 mg via ORAL
  Filled 2014-04-09 (×5): qty 1

## 2014-04-09 MED ORDER — CITALOPRAM HYDROBROMIDE 40 MG PO TABS
40.0000 mg | ORAL_TABLET | Freq: Every day | ORAL | Status: DC
Start: 2014-04-10 — End: 2014-04-13
  Administered 2014-04-10 – 2014-04-13 (×4): 40 mg via ORAL
  Filled 2014-04-09 (×6): qty 1

## 2014-04-09 MED ORDER — BENZTROPINE MESYLATE 1 MG PO TABS
1.0000 mg | ORAL_TABLET | Freq: Every day | ORAL | Status: DC
  Administered 2014-04-10 – 2014-04-12 (×3): 1 mg via ORAL
  Filled 2014-04-09 (×5): qty 1

## 2014-04-09 NOTE — Progress Notes (Signed)
Patient ID: Diana Santos, female   DOB: 07/02/1961, 54 y.o.   MRN: [id number] [facility] MD Progress Note  04/09/2014 11:49 AM Diana Santos  MRN:  [id number]  Subjective:  Patient was seen today to discuss her current response to treatment. Patient has complaining that she's not doing too good, she has some suicidal thoughts, it will, as stated, depressed and in attaining only a few groups a day and continued to feel shaky and nervousness regarding possible death. Patient reported that sleep is on and off but it patient reported her depression as 9/10 and anxiety 10 out of 10 which is not congruent with her object to findings/presentation. Patient requested adust a medication because he has a daytime sedation/sleepiness and having hard time to sleep at night. Reviewed hgbA1c was 12.4 which indicates she has poor control of diabetes for the last 3 months.  Patient reported she has history of alcohol dependence and interested in residential rehabilitation when she was psychiatrically stable. Patient has been communicating with her case manager about her interest. She denies any alcohol on the day of admission, but states she was drinking 3-4 40's 4-5 x a week. She denies any blackouts or seizures.    Diagnosis:   DSM5: Schizophrenia Disorders:   Obsessive-Compulsive Disorders:   Trauma-Stressor Disorders:   Substance/Addictive Disorders:   Depressive Disorders:   Total Time spent with patient: 20 minutes  Axis I: Bipolar, Depressed  ADL's:  Intact  Sleep: Fair  Appetite:  Poor  Suicidal Ideation:  denies Homicidal Ideation:  denies AEB (as evidenced by):  Psychiatric Specialty Exam: Physical Exam  ROS  Blood pressure 114/71, pulse 76, temperature 97.1 F (36.2 C), temperature source Oral, resp. rate 20, height 5\' 4"  (1.626 m), weight 59.875 kg (132 lb), last menstrual period 06/30/2013.Body mass index is 22.65 kg/(m^2).  General Appearance: Disheveled  Eye Contact::  Poor  Speech:   Slow  Volume:  Decreased  Mood:  Depressed  Affect:  Flat  Thought Process:  Goal Directed  Orientation:  Full (Time, Place, and Person)  Thought Content:  Hallucinations: Auditory  Suicidal Thoughts:  No  Homicidal Thoughts:  No  Memory:  NA  Judgement:  Impaired  Insight:  Shallow  Psychomotor Activity:  Tremor  Concentration:  Poor  Recall:  Poor  Fund of Knowledge:Fair  Language: Good  Akathisia:  No  Handed:  Right  AIMS (if indicated):     Assets:  Communication Skills Desire for Improvement  Sleep:  Number of Hours: 6.75   Musculoskeletal: Strength & Muscle Tone: within normal limits Gait & Station: unsteady Patient leans: Front  Current Medications: Current Facility-Administered Medications  Medication Dose Route Frequency Provider Last Rate Last Dose  . acetaminophen (TYLENOL) tablet 1,000 mg  1,000 mg Oral Q6H PRN Nanine Means, NP   1,000 mg at 04/08/14 2224  . alum & mag hydroxide-simeth (MAALOX/MYLANTA) 200-200-20 MG/5ML suspension 30 mL  30 mL Oral Q4H PRN Nanine Means, NP      . benzonatate (TESSALON) capsule 100 mg  100 mg Oral TID PRN Nanine Means, NP   100 mg at 04/08/14 2224  . benztropine (COGENTIN) tablet 1 mg  1 mg Oral BID Nanine Means, NP   1 mg at 04/09/14 0810  . chlordiazePOXIDE (LIBRIUM) capsule 25 mg  25 mg Oral QID PRN Verne Spurr, PA-C   25 mg at 04/08/14 2225  . citalopram (CELEXA) tablet 20 mg  20 mg Oral Daily Nanine Means, NP   20 mg at 04/09/14 0811  .  haloperidol (HALDOL) tablet 5 mg  5 mg Oral BID Nanine MeansJamison Lord, NP   5 mg at 04/09/14 [id number]  . hydrOXYzine (ATARAX/VISTARIL) tablet 50 mg  50 mg Oral QHS PRN Nanine MeansJamison Lord, NP      . insulin aspart (novoLOG) injection 0-15 Units  0-15 Units Subcutaneous TID AC & HS Beau FannyJohn C Withrow, FNP   8 Units at 04/08/14 1754  . insulin glargine (LANTUS) injection 50 Units  50 Units Subcutaneous QHS Nanine MeansJamison Lord, NP   50 Units at 04/08/14 2229  . lisinopril (PRINIVIL,ZESTRIL) tablet 10 mg  10 mg Oral Daily  Nanine MeansJamison Lord, NP   10 mg at 04/09/14 0811  . magnesium hydroxide (MILK OF MAGNESIA) suspension 30 mL  30 mL Oral Daily PRN Nanine MeansJamison Lord, NP      . metFORMIN (GLUCOPHAGE) tablet 1,000 mg  1,000 mg Oral BID WC Nanine MeansJamison Lord, NP   1,000 mg at 04/09/14 0810  . nicotine (NICODERM CQ - dosed in mg/24 hours) patch 21 mg  21 mg Transdermal Daily Nanine MeansJamison Lord, NP   21 mg at 04/09/14 0814  . ondansetron (ZOFRAN) tablet 4 mg  4 mg Oral Q8H PRN Nanine MeansJamison Lord, NP      . pneumococcal 23 valent vaccine (PNU-IMMUNE) injection 0.5 mL  0.5 mL Intramuscular Tomorrow-1000 Nehemiah SettleJanardhaha R Khady Vandenberg, MD      . simvastatin (ZOCOR) tablet 40 mg  40 mg Oral QPM Nanine MeansJamison Lord, NP   40 mg at 04/08/14 1744  . traZODone (DESYREL) tablet 100 mg  100 mg Oral QHS Nanine MeansJamison Lord, NP        Lab Results:  Results for orders placed during the hospital encounter of 04/06/14 (from the past 48 hour(s))  GLUCOSE, CAPILLARY     Status: None   Collection Time    04/07/14 12:05 PM      Result Value Ref Range   Glucose-Capillary 86  70 - 99 mg/dL  GLUCOSE, CAPILLARY     Status: Abnormal   Collection Time    04/07/14  5:02 PM      Result Value Ref Range   Glucose-Capillary 203 (*) 70 - 99 mg/dL  GLUCOSE, CAPILLARY     Status: Abnormal   Collection Time    04/07/14  8:55 PM      Result Value Ref Range   Glucose-Capillary 148 (*) 70 - 99 mg/dL  GLUCOSE, CAPILLARY     Status: None   Collection Time    04/08/14  6:14 AM      Result Value Ref Range   Glucose-Capillary 91  70 - 99 mg/dL  GLUCOSE, CAPILLARY     Status: Abnormal   Collection Time    04/08/14 12:00 PM      Result Value Ref Range   Glucose-Capillary 69 (*) 70 - 99 mg/dL  GLUCOSE, CAPILLARY     Status: Abnormal   Collection Time    04/08/14  5:04 PM      Result Value Ref Range   Glucose-Capillary 255 (*) 70 - 99 mg/dL  HEMOGLOBIN [id number]     Status: Abnormal   Collection Time    04/08/14  7:49 PM      Result Value Ref Range   Hemoglobin A1C 12.4 (*) <5.7 %   Comment:  (NOTE)  According to the ADA Clinical Practice Recommendations for 2011, when     HbA1c is used as a screening test:      >=6.5%   Diagnostic of Diabetes Mellitus               (if abnormal result is confirmed)     5.7-6.4%   Increased risk of developing Diabetes Mellitus     References:Diagnosis and Classification of Diabetes Mellitus,Diabetes     Care,2011,34(Suppl 1):S62-S69 and Standards of Medical Care in             Diabetes - 2011,Diabetes Care,2011,34 (Suppl 1):S11-S61.   Mean Plasma Glucose 309 (*) <117 mg/dL   Comment: Performed at [employer] [facility]  GLUCOSE, CAPILLARY     Status: None   Collection Time    04/08/14  8:44 PM      Result Value Ref Range   Glucose-Capillary 80  70 - 99 mg/dL  GLUCOSE, CAPILLARY     Status: Abnormal   Collection Time    04/09/14  6:17 AM      Result Value Ref Range   Glucose-Capillary 49 (*) 70 - 99 mg/dL  GLUCOSE, CAPILLARY     Status: None   Collection Time    04/09/14  6:37 AM      Result Value Ref Range   Glucose-Capillary 77  70 - 99 mg/dL  GLUCOSE, CAPILLARY     Status: None   Collection Time    04/09/14  9:29 AM      Result Value Ref Range   Glucose-Capillary 74  70 - 99 mg/dL   Comment 1 Notify RN      Physical Findings: AIMS: Facial and Oral Movements Muscles of Facial Expression: None, normal Lips and Perioral Area: None, normal Jaw: None, normal Tongue: None, normal,Extremity Movements Upper (arms, wrists, hands, fingers): None, normal Lower (legs, knees, ankles, toes): None, normal, Trunk Movements Neck, shoulders, hips: None, normal, Overall Severity Severity of abnormal movements (highest score from questions above): None, normal Incapacitation due to abnormal movements: None, normal Patient's awareness of abnormal movements (rate only patient's report): No Awareness, Dental Status Current problems with teeth and/or dentures?: Yes (Has a  dentist appt soon) Does patient usually wear dentures?: Yes (uppers)  CIWA:  CIWA-Ar Total: 4 COWS:     Treatment Plan Summary: Daily contact with patient to assess and evaluate symptoms and progress in treatment Medication management, depression, suicidal thoughts anxiety and history of substance abuse  Plan: NEW: 1. Continue Librium 25mg  QID PRN x 2 days for withdrawal symptoms. 2. HgbA1c for type 2 DM which is 12.4 indicating noncompliance with medication. 3. change Haldol 5 mg at bedtime and Cogentin 1 mg at bedtime 4. Increase Celexa 40 mg daily for depression and anxiety  1. Continue crisis management and stabilization. 2. Continue medication management by assessing side effects, dose modification as needed and therapeutic blood levels as indicated. 3. Treat health problems as indicated or consult IM as needed. 4. Continue treatment plan to decrease risk of relapse upon discharge and to reduce the need for readmission to incorporate the use of local resources for support. 5. Psycho-social education regarding relapse prevention through good self care to incorporate diet, exercise, stress reduction, good sleep hygiene, and reduction in external risk factors such as alcohol, tobacco, and substance abuse. 6. Gain consent for contact with family, PCP, or outside health provider as needed. 7. Review home medications as needed. 8. Disposition is in progress, may be discharged on Monday if  she can contract for safety and able to show clinical improvement.  Medical Decision Making Problem Points:  Established problem, stable/improving (1) and New problem, with additional work-up planned (4) Data Points:  Review or order clinical lab tests (1) Review of medication regiment & side effects (2)  I certify that inpatient services furnished can reasonably be expected to improve the patient's condition.    Randal BubaJanardhaha R Dominiq Fontaine 04/09/2014 11:50 AM

## 2014-04-09 NOTE — Progress Notes (Signed)
Patient ID: Diana Santos, female   DOB: [birth year], 53 y.o.   MRN: [id number] D: Patient in room on approach. Pt mood and affect appeared depressed and flat. Pt has been isolative in her room most of the evening with little interaction with peers. Pt denies SI/HI/AVH and pain.  Pt denies any needs or concerns.  Cooperative with assessment. No acute distressed noted at this time.   A: Met with pt 1:1. Medications administered as prescribed. Writer encouraged pt to discuss feelings. Pt encouraged to come to staff with any question or concerns.   R: Patient remains safe. She is complaint with medications and denies any adverse reaction. Continue current POC.

## 2014-04-09 NOTE — [facility] Group Notes (Signed)
[facility] LCSW Group Therapy  Living A Balanced Life  1:15 - 2: 30          04/09/2014    Type of Therapy:  Group Therapy  Participation Level:  Appropriate  Participation Quality:  Appropriate  Affect:  Appropriate  Cognitive:  Attentive Appropriate  Insight: Developing/Improving  Engagement in Therapy:  Developing/Improving  Modes of Intervention:  Discussion Exploration Problem-Solving Supportive   Summary of Progress/Problems: Topic for group was Living a Balanced Life.  Patient was able to show how life has become unbalanced.  She share her life has been out of balance and she plans to go to Rehab in an effort to get her life together.  Patient able to  Identify appropriate coping skills.   Wynn BankerHodnett, Ricci Paff Hairston 04/09/2014

## 2014-04-09 NOTE — [facility] Suicide Risk Assessment (Signed)
[facility] INPATIENT:  Family/Significant Other Suicide Prevention Education  Suicide Prevention Education:  Education Completed; Peggye FothergillCarrie Wright - mother [phone]),  (name of family member/significant other) has been identified by the patient as the family member/significant other with whom the patient will be residing, and identified as the person(s) who will aid the patient in the event of a mental health crisis (suicidal ideations/suicide attempt).  With written consent from the patient, the family member/significant other has been provided the following suicide prevention education, prior to the and/or following the discharge of the patient.  The suicide prevention education provided includes the following:  Suicide risk factors  Suicide prevention and interventions  National Suicide Hotline telephone number  [facility] assessment telephone number  [facility] 911  [facility] and/or Residential Mobile Crisis Unit telephone number  Request made of family/significant other to:  Remove weapons (e.g., guns, rifles, knives), all items previously/currently identified as safety concern.    Remove drugs/medications (over-the-counter, prescriptions, illicit drugs), all items previously/currently identified as a safety concern.  The family member/significant other verbalizes understanding of the suicide prevention education information provided.  The family member/significant other agrees to remove the items of safety concern listed above.  Mother had a hard time understanding what CSW was saying so the conversation was limited.    Johnrobert Foti N Horton 04/09/2014, 9:12 AM

## 2014-04-09 NOTE — Progress Notes (Signed)
Pt has been up and showered. She has been going to most of the groups.  She rated all her depression hopelessness and anxiety a 10 on her self-inventory.  She has some passive suicidal thoughts but has no plan here in the hospital and contracts to come to staff.

## 2014-04-09 NOTE — Progress Notes (Signed)
Didn't attend group 

## 2014-04-09 NOTE — Progress Notes (Signed)
Adult Psychoeducational Group Note  Date:  04/09/2014 Time:  10:00am Group Topic/Focus:  Personal Choices and Values:   The focus of this group is to help patients assess and explore the importance of values in their lives, how their values affect their decisions, how they express their values and what opposes their expression.  Participation Level:  Active  Participation Quality:  Appropriate and Attentive  Affect:  Appropriate  Cognitive:  Alert and Appropriate  Insight: Appropriate  Engagement in Group:  Engaged  Modes of Intervention:  Discussion and Education  Additional Comments:  Pt attended and participated in group. Pt was ask what lifestyle change can you make to better your life? Pt stated being around positive people.   Pryor Curiaanya D Garner 04/09/2014, 2:24 PM

## 2014-04-09 NOTE — Progress Notes (Signed)
Patient ID: Diana Santos, female   DOB: 05/23/1961, 53 y.o.   MRN: [id number] D: Took over patient's care @ 2330. Patient in bed sleeping. Respiration regular and unlabored. No sign of distress noted at this time A: 15 mins checks for safety. R: Patient remains asleep. Pt is safe.

## 2014-04-09 NOTE — [facility] Group Notes (Signed)
0900 nursing orientation group   The focus of this group is to educate the patient on the purpose and policies of crisis stabilization and provide a format to answer questions about their admission.  The group details unit policies and expectations of patients while admitted.  Pt was an active participant her goal today "not to stop my medications unless told by the doctor"

## 2014-04-09 NOTE — Progress Notes (Signed)
Breakfast tray brought to pt's room

## 2014-04-09 NOTE — Progress Notes (Signed)
Pt in bed cold and feeling shaky. Pt CBG this am was 49. 4 oz of grape juice given to pt. CBG rechecked after 15 mins. CBG was 77. Pt advised to eat breakfast.

## 2014-04-10 LAB — GLUCOSE, CAPILLARY
GLUCOSE-CAPILLARY: 137 mg/dL — AB (ref 70–99)
GLUCOSE-CAPILLARY: 87 mg/dL (ref 70–99)
Glucose-Capillary: 158 mg/dL — ABNORMAL HIGH (ref 70–99)
Glucose-Capillary: 83 mg/dL (ref 70–99)
Glucose-Capillary: 112 mg/dL — ABNORMAL HIGH (ref 70–99)

## 2014-04-10 MED ORDER — CLOTRIMAZOLE 2 % VA CREA
1.0000 | TOPICAL_CREAM | Freq: Every day | VAGINAL | Status: DC
  Filled 2014-04-10 (×2): qty 22.2

## 2014-04-10 MED ORDER — CLOTRIMAZOLE 2 % VA CREA: 1.0000 | TOPICAL_CREAM | Freq: Every day | VAGINAL | Status: DC

## 2014-04-10 MED ORDER — CLOTRIMAZOLE 1 % VA CREA
1.0000 | TOPICAL_CREAM | Freq: Every day | VAGINAL | Status: DC
  Administered 2014-04-10 – 2014-04-12 (×3): 1 via VAGINAL
  Filled 2014-04-10: qty 45

## 2014-04-10 NOTE — [facility] Group Notes (Signed)
[facility] LCSW Group Therapy  04/10/2014  1:15 PM   Type of Therapy:  Group Therapy  Participation Level:  Active  Participation Quality:  Attentive, Sharing and Supportive  Affect:  Depressed and Flat  Cognitive:  Alert and Oriented  Insight:  Developing/Improving and Engaged  Engagement in Therapy:  Developing/Improving and Engaged  Modes of Intervention:  Clarification, Confrontation, Discussion, Education, Exploration, Limit-setting, Orientation, Problem-solving, Rapport Building, Dance movement psychotherapisteality Testing, Socialization and Support  Summary of Progress/Problems: The topic for today was feelings about relapse.  Pt discussed what relapse prevention is to them and identified triggers that they are on the path to relapse.  Pt processed their feeling towards relapse and was able to relate to peers.  Pt discussed coping skills that can be used for relapse prevention.  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  Pt states that she hates this happened to her but is glad she is here now to get help.  Pt states that she no longer has cravings for drugs while here and contributes it to her faith.  Pt states that she plans to [place] [place] [facility] [place] as this is a trigger for her, from using so long there.  Pt actively participated and was engaged in group discussion.    Reyes IvanChelsea Horton, LCSW 04/10/2014  2:17 PM

## 2014-04-10 NOTE — Progress Notes (Signed)
D) Pt rates her depression and hopelessness both at a 10 and admits to thoughts of SI. Has complained of pain in her right leg stating "I have very bad arthritis and it is very painful. It is hard to make it though every day". Affect is flat and mood depressed. Has been lying in her bed due to a headache. A) Given a cold pack for her headache and encouraged to go and lie down with her eyes closed. Also encouraged when lunch time came to go and eat something. Provided with a 1:1. Pt contracts for safety on the unit. R) Continues to have thoughts of SI but is contracting for her safety.
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
## 2014-04-10 NOTE — Tx Team (Signed)
Interdisciplinary Treatment Plan Update (Adult)  Date: 04/10/2014  Time Reviewed:  9:45 AM  Progress in Treatment: Attending groups: Yes Participating in groups:  Yes Taking medication as prescribed:  Yes Tolerating medication:  Yes Family/Significant othe contact made: Yes, with Diana Santos's mother Patient understands diagnosis:  Yes Discussing patient identified problems/goals with staff:  Yes Medical problems stabilized or resolved:  Yes Denies suicidal/homicidal ideation: Yes Issues/concerns per patient self-inventory:  Yes Other:  New problem(s) identified: N/A  Discharge Plan or Barriers: Diana Santos wants further inpatient treatment.  [facility] and can follow up with [facility] for outpatient medication management and therapy.   Reason for Continuation of Hospitalization: Anxiety Depression Medication Stabilization  Comments: N/A  Estimated length of stay: 3-5 days  For review of initial/current patient goals, please see plan of care.  Attendees: Patient:     Family:     Physician:  Dr. Javier GlazierJohnalagadda 04/10/2014 9:57 AM   Nursing:   Lamount Crankerhris Judge, RN 04/10/2014 9:57 AM   Clinical Social Worker:  Reyes Ivanhelsea Horton, LCSW 04/10/2014 9:57 AM   Other: Quintella ReichertBeverly Knight, RN 04/10/2014 9:57 AM   Other:  Izola Priceawnaly Dax, RN 04/10/2014 9:57 AM   Other:  Onnie BoerJennifer Clark, case manager 04/10/2014 9:57 AM   Other:  Tomasita Morrowelora Sutton, care coordination 04/10/2014 9:58 AM   Other:    Other:    Other:    Other:    Other:    Other:     Scribe for Treatment Team:   Carmina MillerHorton, Raul Torrance Nicole, 04/10/2014 9:57 AM

## 2014-04-10 NOTE — [facility] Group Notes (Signed)
[facility]   04/10/2014 8:45 AM  Participation Quality:  Alert, Appropriate and Oriented  Mood/Affect:  Flat and Depressed  Depression Rating:  8  Anxiety Rating:  7  Thoughts of Suicide:  Pt endorses SI, denies HI  Will you contract for safety?   Yes  Current AVH:  Pt denies  Plan for Discharge/Comments:  Pt attended discharge planning group and actively participated in group.  CSW provided pt with today's workbook.  Pt reports not wanting to go home from here and really wanting to go to further inpatient treatment.  [facility] and [facility]W is monitoring this referral.  Pt states that she'd also be interested in [facility].  CSW will check on this as a possible referral.  [facility] due to primary diagnosis of mental health.  Pt doesn't want to return home at discharge and states she will be willing to go to a shelter if need be.  [facility] and [facility].  No further needs voiced by pt at this time.    Transportation Means: Pt reports access to transportation  Supports: No supports mentioned at this time  Reyes IvanChelsea Horton, LCSW 04/10/2014 9:31 AM

## 2014-04-10 NOTE — Progress Notes (Signed)
Patient ID: Yatzil Clippinger, female   DOB: Oct 21, 1961, 53 y.o.   MRN: [id number] Patient ID: Dian Laprade, female   DOB: Oct 11, 1961, 53 y.o.   MRN: [id number] [facility] MD Progress Note  04/10/2014 4:05 PM Diana Santos  MRN:  [id number]  Subjective:  Diana Santos is up and active on the unit today. She states that she just feels nervous and anxious but doesn't know why. She is also reporting some external vaginal itching and requesting medication for this. Krisanne tells me she would like to go into a rehab facility upon her discharge and states that she is motivated to regain her sobriety. She feels that rehab would help her get sober and stay that way.  Today she rates her depression as a 8/10 and her anxiety as a 10/10.  Diagnosis:   DSM5: Schizophrenia Disorders:   Obsessive-Compulsive Disorders:   Trauma-Stressor Disorders:   Substance/Addictive Disorders:   Depressive Disorders:   Total Time spent with patient: 20 minutes  Axis I: Bipolar, Depressed  ADL's:  Intact  Sleep: Fair  Appetite:  Poor  Suicidal Ideation:  denies Homicidal Ideation:  denies AEB (as evidenced by):  Psychiatric Specialty Exam: Physical Exam  ROS  Blood pressure 116/74, pulse 79, temperature 97.7 F (36.5 C), temperature source Oral, resp. rate 16, height 5\' 4"  (1.626 m), weight 59.875 kg (132 lb), last menstrual period 06/30/2013.Body mass index is 22.65 kg/(m^2).  General Appearance: Disheveled  Eye Contact::  Poor  Speech:  Slow  Volume:  Decreased  Mood:  Depressed  Affect:  Flat  Thought Process:  Goal Directed  Orientation:  Full (Time, Place, and Person)  Thought Content:  Hallucinations: Auditory  Suicidal Thoughts:  No  Homicidal Thoughts:  No  Memory:  NA  Judgement:  Impaired  Insight:  Shallow  Psychomotor Activity:  Tremor  Concentration:  Poor  Recall:  Poor  Fund of Knowledge:Fair  Language: Good  Akathisia:  No  Handed:  Right  AIMS (if indicated):     Assets:  Communication  Skills Desire for Improvement  Sleep:  Number of Hours: 6.5   Musculoskeletal: Strength & Muscle Tone: within normal limits Gait & Station: unsteady Patient leans: Front  Current Medications: Current Facility-Administered Medications  Medication Dose Route Frequency Provider Last Rate Last Dose  . acetaminophen (TYLENOL) tablet 1,000 mg  1,000 mg Oral Q6H PRN Nanine Means, NP   1,000 mg at 04/10/14 0836  . alum & mag hydroxide-simeth (MAALOX/MYLANTA) 200-200-20 MG/5ML suspension 30 mL  30 mL Oral Q4H PRN Nanine Means, NP   30 mL at 04/10/14 0828  . benzonatate (TESSALON) capsule 100 mg  100 mg Oral TID PRN Nanine Means, NP   100 mg at 04/08/14 2224  . benztropine (COGENTIN) tablet 1 mg  1 mg Oral QHS Nehemiah Settle, MD      . citalopram (CELEXA) tablet 40 mg  40 mg Oral Daily Nehemiah Settle, MD   40 mg at 04/10/14 0865  . haloperidol (HALDOL) tablet 5 mg  5 mg Oral QHS Nehemiah Settle, MD      . hydrOXYzine (ATARAX/VISTARIL) tablet 50 mg  50 mg Oral QHS PRN Nanine Means, NP      . insulin aspart (novoLOG) injection 0-15 Units  0-15 Units Subcutaneous TID AC & HS Beau Fanny, FNP   2 Units at 04/10/14 [phone]  . insulin glargine (LANTUS) injection 50 Units  50 Units Subcutaneous QHS Nanine Means, NP   50 Units at 04/09/14 2148  .  lisinopril (PRINIVIL,ZESTRIL) tablet 10 mg  10 mg Oral Daily Nanine MeansJamison Lord, NP   10 mg at 04/10/14 0829  . magnesium hydroxide (MILK OF MAGNESIA) suspension 30 mL  30 mL Oral Daily PRN Nanine MeansJamison Lord, NP      . metFORMIN (GLUCOPHAGE) tablet 1,000 mg  1,000 mg Oral BID WC Nanine MeansJamison Lord, NP   1,000 mg at 04/10/14 0829  . nicotine (NICODERM CQ - dosed in mg/24 hours) patch 21 mg  21 mg Transdermal Daily Nanine MeansJamison Lord, NP   21 mg at 04/10/14 0829  . ondansetron (ZOFRAN) tablet 4 mg  4 mg Oral Q8H PRN Nanine MeansJamison Lord, NP      . pneumococcal 23 valent vaccine (PNU-IMMUNE) injection 0.5 mL  0.5 mL Intramuscular Tomorrow-1000 Nehemiah SettleJanardhaha R  Diana Fiorenza, MD      . simvastatin (ZOCOR) tablet 40 mg  40 mg Oral QPM Nanine MeansJamison Lord, NP   40 mg at 04/09/14 1719  . traZODone (DESYREL) tablet 100 mg  100 mg Oral QHS Nanine MeansJamison Lord, NP        Lab Results:  Results for orders placed during the hospital encounter of 04/06/14 (from the past 48 hour(s))  GLUCOSE, CAPILLARY     Status: Abnormal   Collection Time    04/08/14  5:04 PM      Result Value Ref Range   Glucose-Capillary 255 (*) 70 - 99 mg/dL  HEMOGLOBIN [id number]     Status: Abnormal   Collection Time    04/08/14  7:49 PM      Result Value Ref Range   Hemoglobin A1C 12.4 (*) <5.7 %   Comment: (NOTE)                                                                               According to the ADA Clinical Practice Recommendations for 2011, when     HbA1c is used as a screening test:      >=6.5%   Diagnostic of Diabetes Mellitus               (if abnormal result is confirmed)     5.7-6.4%   Increased risk of developing Diabetes Mellitus     References:Diagnosis and Classification of Diabetes Mellitus,Diabetes     Care,2011,34(Suppl 1):S62-S69 and Standards of Medical Care in             Diabetes - 2011,Diabetes Care,2011,34 (Suppl 1):S11-S61.   Mean Plasma Glucose 309 (*) <117 mg/dL   Comment: Performed at [facility]  GLUCOSE, CAPILLARY     Status: None   Collection Time    04/08/14  8:44 PM      Result Value Ref Range   Glucose-Capillary 80  70 - 99 mg/dL  GLUCOSE, CAPILLARY     Status: Abnormal   Collection Time    04/09/14  6:17 AM      Result Value Ref Range   Glucose-Capillary 49 (*) 70 - 99 mg/dL  GLUCOSE, CAPILLARY     Status: None   Collection Time    04/09/14  6:37 AM      Result Value Ref Range   Glucose-Capillary 77  70 - 99 mg/dL  GLUCOSE, CAPILLARY  Status: None   Collection Time    04/09/14  9:29 AM      Result Value Ref Range   Glucose-Capillary 74  70 - 99 mg/dL   Comment 1 Notify RN    GLUCOSE, CAPILLARY     Status: Abnormal   Collection  Time    04/09/14 11:51 AM      Result Value Ref Range   Glucose-Capillary 119 (*) 70 - 99 mg/dL   Comment 1 Notify RN    GLUCOSE, CAPILLARY     Status: Abnormal   Collection Time    04/09/14  5:04 PM      Result Value Ref Range   Glucose-Capillary 128 (*) 70 - 99 mg/dL   Comment 1 Notify RN    GLUCOSE, CAPILLARY     Status: Abnormal   Collection Time    04/09/14  8:59 PM      Result Value Ref Range   Glucose-Capillary 124 (*) 70 - 99 mg/dL  GLUCOSE, CAPILLARY     Status: Abnormal   Collection Time    04/10/14  6:29 AM      Result Value Ref Range   Glucose-Capillary 137 (*) 70 - 99 mg/dL  GLUCOSE, CAPILLARY     Status: None   Collection Time    04/10/14 11:39 AM      Result Value Ref Range   Glucose-Capillary 87  70 - 99 mg/dL   Comment 1 Notify RN      Physical Findings: AIMS: Facial and Oral Movements Muscles of Facial Expression: None, normal Lips and Perioral Area: None, normal Jaw: None, normal Tongue: None, normal,Extremity Movements Upper (arms, wrists, hands, fingers): None, normal Lower (legs, knees, ankles, toes): None, normal, Trunk Movements Neck, shoulders, hips: None, normal, Overall Severity Severity of abnormal movements (highest score from questions above): None, normal Incapacitation due to abnormal movements: None, normal Patient's awareness of abnormal movements (rate only patient's report): No Awareness, Dental Status Current problems with teeth and/or dentures?: No Does patient usually wear dentures?: No  CIWA:  CIWA-Ar Total: 4 COWS:     Treatment Plan Summary: Daily contact with patient to assess and evaluate symptoms and progress in treatment Medication management, depression, suicidal thoughts anxiety and history of substance abuse  Plan: New: 1. Will add monistat vaginal cream to orders for externa irritation. 2. Continue current plan of care as written. 3. Disposition is in progress.  1. Continue Librium 25mg  QID PRN x 2 days for  withdrawal symptoms. 2. HgbA1c for type 2 DM which is 12.4 indicating noncompliance with medication. 3. continue Haldol 5 mg at bedtime and Cogentin 1 mg at bedtime 4. Continue Celexa 40 mg daily for depression and anxiety Plan of Care: 1. Continue crisis management and stabilization. 2. Continue medication management by assessing side effects, dose modification as needed and therapeutic blood levels as indicated. 3. Treat health problems as indicated or consult IM as needed. 4. Continue treatment plan to decrease risk of relapse upon discharge and to reduce the need for readmission to incorporate the use of local resources for support. 5. Psycho-social education regarding relapse prevention through good self care to incorporate diet, exercise, stress reduction, good sleep hygiene, and reduction in external risk factors such as alcohol, tobacco, and substance abuse. 6. Gain consent for contact with family, PCP, or outside health provider as needed. 7. Review home medications as needed. 8. Disposition is in progress, may be discharged on Monday if she can contract for safety and able to show  clinical improvement.  Medical Decision Making Problem Points:  Established problem, stable/improving (1) and New problem, with additional work-up planned (4) Data Points:  Review or order clinical lab tests (1) Review of medication regiment & side effects (2)  I certify that inpatient services furnished can reasonably be expected to improve the patient's condition.  Rona Ravens. Mashburn RPAC 4:10 PM 04/10/2014   Reviewed the information documented and agree with the treatment plan.  Randal Buba Mareo Portilla 04/10/2014 8:16 PM

## 2014-04-11 DIAGNOSIS — F323 Major depressive disorder, single episode, severe with psychotic features: Principal | ICD-10-CM

## 2014-04-11 DIAGNOSIS — F1420 Cocaine dependence, uncomplicated: Secondary | ICD-10-CM

## 2014-04-11 LAB — GLUCOSE, CAPILLARY
GLUCOSE-CAPILLARY: 100 mg/dL — AB (ref 70–99)
Glucose-Capillary: 73 mg/dL (ref 70–99)
Glucose-Capillary: 186 mg/dL — ABNORMAL HIGH (ref 70–99)
Glucose-Capillary: 161 mg/dL — ABNORMAL HIGH (ref 70–99)

## 2014-04-11 MED ORDER — BISACODYL 5 MG PO TBEC
10.0000 mg | DELAYED_RELEASE_TABLET | Freq: Once | ORAL | Status: AC
  Administered 2014-04-11: 10 mg via ORAL
  Filled 2014-04-11 (×2): qty 2

## 2014-04-11 NOTE — Progress Notes (Signed)
Psychoeducational Group Note  Date: 04/11/2014 Time:  1015  Group Topic/Focus:  Identifying Needs:   The focus of this group is to help patients identify their personal needs that have been historically problematic and identify healthy behaviors to address their needs.  Participation Level:  Did not attend  Additional Comments:   Diana Santos, Diana Santos

## 2014-04-11 NOTE — Progress Notes (Signed)
D) Pt states that the medication is making her sleepy and has been staying in the bed all morning. Pt. Rates her depression and hopelessness both at a 10. Admits to thoughts of SI on and off and is contracting for her safety. Affect is flat and her mood depressed. Has been going to meals and gets up to get her snacks. A) Given support and reassurance along with praise when appropriate. Encouraged to get out of bed and to start going to the groups so that she can help herself feel better. R) Pt admits to thoughts of SI and contracts for her safety while on the unit.

## 2014-04-11 NOTE — Progress Notes (Signed)
Patient ID: Diana Santos, female   DOB: 11/18/1961, 53 y.o.   MR[facility]: [id number] [facility]  04/11/2014 11:24 AM Diana Santos  MRN:  [id number]  Subjective:   Patient states "I had wanted to jump off a bridge. I had fought with my daughter. [facility] I am not hearing voices like I was. I am sleeping at bit better. I am still feeling depressed and anxious."  Objective:  [name] is visible on the unit this morning. Rates her depression at eight and feeling anxious at ten. Her suicidal thoughts and symptoms of psychosis are decreasing. The patient continues to reports symptoms of withdrawal including tremors, sweating, and anxiety. She is cooperative with answering all questions asked of her. Patient indicates that the voices had been telling her to jump off a bridge prior to admission. She appears to be making steady progress in her treatment. Patient worries about her ability to stay away from cocaine and alcohol. Her UDS was positive for cocaine.   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   HENT: Negative.   Eyes: Negative.   Respiratory: Negative.   Cardiovascular: Negative.   Gastrointestinal: Negative.   Genitourinary: Negative.   Musculoskeletal: Negative.   Skin: Negative.   Neurological: Negative.   Endo/Heme/Allergies: Negative.   Psychiatric/Behavioral: Positive for depression, suicidal ideas, hallucinations and substance abuse. Negative for memory  loss. The patient is nervous/anxious. The patient does not have insomnia.     Blood pressure 130/83, pulse 72, temperature 98 F (36.7 C), temperature source Oral, resp. rate 16, height 5\' 4"  (1.626 m), weight 59.875 kg (132 lb), last menstrual period 06/30/2013.Body mass index is 22.65 kg/(m^2).  General Appearance: Casual  Eye Contact::  Fair  Speech:  Slow  Volume:  Decreased  Mood:  Depressed  Affect:  Flat  Thought Process:  Goal Directed  Orientation:  Full (Time, Place, and Person)  Thought Content:  Hallucinations: Auditory  Suicidal Thoughts:  Yes.  without intent/plan  Homicidal Thoughts:  No  Memory:  NA  Judgement:  Impaired  Insight:  Shallow  Psychomotor Activity:  Tremor  Concentration:  Poor  Recall:  Poor  Fund of Knowledge:Fair  Language: Good  Akathisia:  No  Handed:  Right  AIMS (if indicated):     Assets:  Communication Skills Desire for Improvement Resilience  Sleep:  Number of Hours: 6.5   Musculoskeletal: Strength & Muscle Tone: within normal limits Gait & Station: unsteady Patient leans: Front  Current Medications: Current Facility-Administered Medications  Medication Dose Route Frequency Provider Last Rate Last Dose  . acetaminophen (TYLENOL) tablet 1,000 mg  1,000 mg Oral Q6H PRN Nanine MeansJamison Lord, NP   1,000 mg at 04/10/14 0836  . alum & mag hydroxide-simeth (MAALOX/MYLANTA) 200-200-20 MG/5ML suspension 30 mL  30 mL Oral Q4H PRN Nanine MeansJamison Lord, NP   30 mL at 04/10/14 0828  . benzonatate (TESSALON) capsule 100 mg  100 mg  Oral TID PRN Nanine Means, NP   100 mg at 04/08/14 2224  . benztropine (COGENTIN) tablet 1 mg  1 mg Oral QHS Nehemiah Settle, MD      . citalopram (CELEXA) tablet 40 mg  40 mg Oral Daily Nehemiah Settle, MD   40 mg at 04/10/14 1478  . haloperidol (HALDOL) tablet 5 mg  5 mg Oral QHS Nehemiah Settle, MD      . hydrOXYzine (ATARAX/VISTARIL) tablet 50 mg  50 mg Oral QHS PRN Nanine Means, NP      . insulin  aspart (novoLOG) injection 0-15 Units  0-15 Units Subcutaneous TID AC & HS Beau Fanny, FNP   2 Units at 04/10/14 [phone]  . insulin glargine (LANTUS) injection 50 Units  50 Units Subcutaneous QHS Nanine Means, NP   50 Units at 04/09/14 2148  . lisinopril (PRINIVIL,ZESTRIL) tablet 10 mg  10 mg Oral Daily Nanine Means, NP   10 mg at 04/10/14 0829  . magnesium hydroxide (MILK OF MAGNESIA) suspension 30 mL  30 mL Oral Daily PRN Nanine Means, NP      . metFORMIN (GLUCOPHAGE) tablet 1,000 mg  1,000 mg Oral BID WC Nanine Means, NP   1,000 mg at 04/10/14 0829  . nicotine (NICODERM CQ - dosed in mg/24 hours) patch 21 mg  21 mg Transdermal Daily Nanine Means, NP   21 mg at 04/10/14 0829  . ondansetron (ZOFRAN) tablet 4 mg  4 mg Oral Q8H PRN Nanine Means, NP      . pneumococcal 23 valent vaccine (PNU-IMMUNE) injection 0.5 mL  0.5 mL Intramuscular Tomorrow-1000 Nehemiah Settle, MD      . simvastatin (ZOCOR) tablet 40 mg  40 mg Oral QPM Nanine Means, NP   40 mg at 04/09/14 1719  . traZODone (DESYREL) tablet 100 mg  100 mg Oral QHS Nanine Means, NP        Lab Results:  Results for orders placed during the hospital encounter of 04/06/14 (from the past 48 hour(s))  GLUCOSE, CAPILLARY     Status: Abnormal   Collection Time    04/08/14  5:04 PM      Result Value Ref Range   Glucose-Capillary 255 (*) 70 - 99 mg/dL  HEMOGLOBIN O1H     Status: Abnormal   Collection Time    04/08/14  7:49 PM      Result Value Ref Range   Hemoglobin A1C 12.4 (*) <5.7 %   Comment: (NOTE)                                                                               According to the ADA Clinical Practice Recommendations for 2011, when     HbA1c is used as a screening test:      >=6.5%   Diagnostic of Diabetes Mellitus               (if abnormal result is confirmed)     5.7-6.4%   Increased risk of developing Diabetes Mellitus     References:Diagnosis and Classification of Diabetes Mellitus,Diabetes      Care,2011,34(Suppl 1):S62-S69 and Standards of Medical Care in  Diabetes - 2011,Diabetes Care,2011,34 (Suppl 1):S11-S61.   Mean Plasma Glucose 309 (*) <117 mg/dL   Comment: [facility]  GLUCOSE, CAPILLARY     Status: None   Collection Time    04/08/14  8:44 PM      Result Value Ref Range   Glucose-Capillary 80  70 - 99 mg/dL  GLUCOSE, CAPILLARY     Status: Abnormal   Collection Time    04/09/14  6:17 AM      Result Value Ref Range   Glucose-Capillary 49 (*) 70 - 99 mg/dL  GLUCOSE, CAPILLARY     Status: None   Collection Time    04/09/14  6:37 AM      Result Value Ref Range   Glucose-Capillary 77  70 - 99 mg/dL  GLUCOSE, CAPILLARY     Status: None   Collection Time    04/09/14  9:29 AM      Result Value Ref Range   Glucose-Capillary 74  70 - 99 mg/dL   Comment 1 Notify RN    GLUCOSE, CAPILLARY     Status: Abnormal   Collection Time    04/09/14 11:51 AM      Result Value Ref Range   Glucose-Capillary 119 (*) 70 - 99 mg/dL   Comment 1 Notify RN    GLUCOSE, CAPILLARY     Status: Abnormal   Collection Time    04/09/14  5:04 PM      Result Value Ref Range   Glucose-Capillary 128 (*) 70 - 99 mg/dL   Comment 1 Notify RN    GLUCOSE, CAPILLARY     Status: Abnormal   Collection Time    04/09/14  8:59 PM      Result Value Ref Range   Glucose-Capillary 124 (*) 70 - 99 mg/dL  GLUCOSE, CAPILLARY     Status: Abnormal   Collection Time    04/10/14  6:29 AM      Result Value Ref Range   Glucose-Capillary 137 (*) 70 - 99 mg/dL  GLUCOSE, CAPILLARY     Status: None   Collection Time    04/10/14 11:39 AM      Result Value Ref Range   Glucose-Capillary 87  70 - 99 mg/dL   Comment 1 Notify RN      Physical Findings: AIMS: Facial and Oral Movements Muscles of Facial Expression: None, normal Lips and Perioral Area: None, normal Jaw: None, normal Tongue: None, normal,Extremity Movements Upper (arms, wrists, hands, fingers): None, normal Lower (legs,  knees, ankles, toes): None, normal, Trunk Movements Neck, shoulders, hips: None, normal, Overall Severity Severity of abnormal movements (highest score from questions above): None, normal Incapacitation due to abnormal movements: None, normal Patient's awareness of abnormal movements (rate only patient's report): No Awareness, Dental Status Current problems with teeth and/or dentures?: No Does patient usually wear dentures?: No  CIWA:  CIWA-Ar Total: 4 COWS:     Treatment Plan Summary: Daily contact with patient to assess and evaluate symptoms and progress in treatment Medication management, depression, suicidal thoughts anxiety and history of substance abuse  Plan of Care: 1. Continue crisis management and stabilization. 2. Continue medication management by assessing side effects, dose modification as needed and therapeutic blood levels as indicated. Continue Librium QID prn for alcohol withdrawal, Haldol 5 mg at hs for psychosis and Cogentin 1 mg hs for EPS prevention, Celexa 40 mg daily for depression.  3. Treat health problems as indicated or consult IM as needed. 4. Continue  treatment plan to decrease risk of relapse upon discharge and to reduce the need for readmission to incorporate the use of local resources for support. 5. Psycho-social education regarding relapse prevention through good self care to incorporate diet, exercise, stress reduction, good sleep hygiene, and reduction in external risk factors such as alcohol, tobacco, and substance abuse. 6. Gain consent for contact with family, PCP, or outside health provider as needed. 7. Review home medications as needed. 8. Disposition is in progress, may be discharged on Monday if she can contract for safety and able to show clinical improvement.  Medical Decision Making Problem Points:  Established problem, stable/improving (1), Review of last therapy session (1) and Review of psycho-social stressors (1) Data Points:  Review or  order clinical lab tests (1) Review of medication regiment & side effects (2)  I certify that inpatient services furnished can reasonably be expected to improve the patient's condition.  Fransisca Kaufmann NP-C 11:24 AM 04/11/2014  I agreed with the findings, treatment and disposition plan of this patient. Kathryne Sharper, MD

## 2014-04-11 NOTE — Progress Notes (Addendum)
Writer observed patient lying in bed resting. She reports that she has been feeling weak most of the day and is not sure why. Writer re-educated her on fall precautions, she reports that she did not want to take the trazadone for sleep and took her nicotine patch off because she is not sure if that maybe contributing to her feeling nauseated. She reports passive si and verbally contracts, denies hi/a/v hallucinations. She refused her trazadone and her lantus at hs, reported that trazadone gives her nightmares and didn't take her lantus reporting that since her blood sugar was 100 and she had vomited 2 x today she didn't want it to drop her even more.Support and encouragement given, safety maintained on unit with 15 min checks.

## 2014-04-11 NOTE — Progress Notes (Signed)
 .  Psychoeducational Group Note    Date: 04/11/2014 Time: 0930   Goal Setting Purpose of Group: To be able to set a goal that is measurable and that can be accomplished in one day  Participation Level:  Did not attend Dione HousekeeperJudge, Demeka Sutter A

## 2014-04-11 NOTE — [facility] Group Notes (Signed)
[facility] LCSW Group Therapy Note  04/11/2014 1:15 PM  Type of Therapy and Topic:  Group Therapy: Avoiding Self-Sabotaging and Enabling Behaviors  Participation Level:  Did Not attend  Carney Bernatherine C Yue Glasheen, LCSW

## 2014-04-11 NOTE — Progress Notes (Signed)
Patient ID: Diana Santos, female   DOB: 24-Aug-1961, 53 y.o.   MRN: [id number] D: Patient is up and about on unit. Pt stated she better than she has felt in a long time. Pt stated plans to go to a long term facility after discharge. Pt mood and affect appeared anxious and sad.  Pt denies SI/HI/AVH and pain.  Pt denies any needs or concerns.  Cooperative with assessment. No acute distressed noted at this time.   A: Met with pt 1:1. Medications administered as prescribed. Writer encouraged pt to discuss feelings. Pt encouraged to come to staff with any question or concerns.   R: Patient remains safe. She is complaint with medications and denies any adverse reaction. Continue current POC.

## 2014-04-11 NOTE — Progress Notes (Signed)
The focus of this group is to help patients review their daily goal of treatment and discuss progress on daily workbooks. Pt attended the evening group and responded to all discussion prompts from the Writer. Pt shared that today was a good day on the unit, the highlight of which was talking to her family on the phone, whom the Pt considers supportive of her seeking treatment. Pt's only additional request from Nursing Staff this evening was to receive ear plugs, which were given to her following group. Pt's affect was bright.

## 2014-04-12 LAB — GLUCOSE, CAPILLARY
GLUCOSE-CAPILLARY: 87 mg/dL (ref 70–99)
Glucose-Capillary: 132 mg/dL — ABNORMAL HIGH (ref 70–99)
Glucose-Capillary: 143 mg/dL — ABNORMAL HIGH (ref 70–99)
Glucose-Capillary: 148 mg/dL — ABNORMAL HIGH (ref 70–99)

## 2014-04-12 MED ORDER — PANTOPRAZOLE SODIUM 40 MG PO TBEC
40.0000 mg | DELAYED_RELEASE_TABLET | Freq: Every day | ORAL | Status: DC
  Administered 2014-04-12 – 2014-04-13 (×2): 40 mg via ORAL
  Filled 2014-04-12 (×6): qty 1

## 2014-04-12 NOTE — Progress Notes (Signed)
Patient ID: Diana Santos, female   DOB: March 06, 1961, 53 y.o.   MRN: [id number] [facility] MD Progress Note  04/12/2014 9:23 AM Airica Schwartzkopf  MRN:  [id number]  Subjective:   Patient states "I had wanted to jump off a bridge. I had fought with my daughter. I want rehab at [facility]. It is still coming through my head about hurting myself. I been seeing shadows "dark space, something dark". Voices are telling me that I am not worth living and I am worthless. I am sleeping at bit better. I am still feeling depressed and anxious." She states the medication is making her sleep, she feels like it is the haldol that is making her drowsy. States her psychiatrist made initially gave the Haldol outpatient, and she also took it in the morning too. She is requesting it be moved to night time aministration.   Objective:  Danea is observed lying in the bed this morning. Rates her depression at eight and feeling anxious at ten same as yesterday. Her suicidal thoughts are increasing, however symptoms of psychosis are decreasing. The patient reports decreased cravings, continues to reports symptoms of withdrawal including vomiting, tremors, sweating, and anxiety. She is cooperative with answering all questions asked of her. She appears to be making stable in progress, however she is not active in groups at this time. She would like to go to group but she is always tired. She did attend group one at night time and one during the day. Patient worries about her ability to stay away from cocaine and alcohol. Her UDS was positive for cocaine.  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   HENT: Negative.   Eyes: Negative.   Respiratory: Negative.   Cardiovascular: Negative.   Gastrointestinal: Negative.   Genitourinary: Negative.   Musculoskeletal: Negative.   Skin: Negative.   Neurological: Negative.   Endo/Heme/Allergies: Negative.   Psychiatric/Behavioral: Positive for depression, suicidal ideas, hallucinations and substance abuse. Negative for memory loss. The patient is nervous/anxious. The patient does not have insomnia.     Blood pressure 138/86, pulse 80, temperature 97.7 F (36.5 C), temperature source Oral, resp. rate 16, height 5\' 4"  (1.626 m), weight 59.875 kg (132 lb), last menstrual period 06/30/2013.Body mass index is 22.65 kg/(m^2).  General Appearance: Casual  Eye Contact::  Fair  Speech:  Slow  Volume:  Decreased  Mood:  Depressed  Affect:  Flat  Thought Process:  Goal Directed  Orientation:  Full (Time, Place, and Person)  Thought Content:  Hallucinations: Auditory  Suicidal Thoughts:  Yes.  without intent/plan  Homicidal Thoughts:  No  Memory:  NA  Judgement:  Impaired  Insight:  Shallow  Psychomotor Activity:  Tremor  Concentration:  Poor  Recall:  Poor  Fund of Knowledge:Fair  Language: Good  Akathisia:  No  Handed:  Right  AIMS (if indicated):     Assets:  Communication Skills Desire for Improvement Resilience  Sleep:  Number of Hours: 6.75   Musculoskeletal: Strength & Muscle Tone: within normal limits Gait & Station: unsteady Patient leans: Front  Current Medications: Current Facility-Administered Medications  Medication Dose Route Frequency Provider Last Rate Last Dose  . acetaminophen (TYLENOL) tablet 1,000 mg  1,000 mg Oral Q6H PRN Nanine MeansJamison Lord, NP   1,000 mg at 04/10/14 0836  . alum & mag hydroxide-simeth (MAALOX/MYLANTA) 200-200-20 MG/5ML suspension 30  mL  30 mL Oral Q4H PRN Nanine MeansJamison Lord, NP   30 mL at 04/10/14 0828  . benzonatate (TESSALON) capsule 100 mg  100 mg Oral TID PRN Nanine MeansJamison Lord, NP   100 mg at 04/08/14 2224  . benztropine (COGENTIN) tablet 1 mg  1 mg Oral QHS Nehemiah SettleJanardhaha R Jonnalagadda, MD      . citalopram (CELEXA) tablet 40 mg  40 mg Oral Daily Nehemiah SettleJanardhaha R Jonnalagadda, MD   40 mg at 04/10/14 [id number]  . haloperidol (HALDOL) tablet 5 mg  5 mg Oral QHS Nehemiah SettleJanardhaha R Jonnalagadda, MD      . hydrOXYzine (ATARAX/VISTARIL) tablet 50 mg  50 mg Oral QHS PRN Nanine MeansJamison Lord, NP      . insulin aspart (novoLOG) injection 0-15 Units  0-15 Units Subcutaneous TID AC & HS Beau FannyJohn C Withrow, FNP   2 Units at 04/10/14 [phone]  . insulin glargine (LANTUS) injection 50 Units  50 Units Subcutaneous QHS Nanine MeansJamison Lord, NP   50 Units at 04/09/14 2148  . lisinopril (PRINIVIL,ZESTRIL) tablet 10 mg  10 mg Oral Daily Nanine MeansJamison Lord, NP   10 mg at 04/10/14 0829  . magnesium hydroxide (MILK OF MAGNESIA) suspension 30 mL  30 mL Oral Daily PRN Nanine MeansJamison Lord, NP      . metFORMIN (GLUCOPHAGE) tablet 1,000 mg  1,000 mg Oral BID WC Nanine MeansJamison Lord, NP   1,000 mg at 04/10/14 0829  . nicotine (NICODERM CQ - dosed in mg/24 hours) patch 21 mg  21 mg Transdermal Daily Nanine MeansJamison Lord, NP   21 mg at 04/10/14 0829  . ondansetron (ZOFRAN) tablet 4 mg  4 mg Oral Q8H PRN Nanine MeansJamison Lord, NP      . pneumococcal 23 valent vaccine (PNU-IMMUNE) injection 0.5 mL  0.5 mL Intramuscular Tomorrow-1000 Nehemiah SettleJanardhaha R Jonnalagadda, MD      . simvastatin (ZOCOR) tablet 40 mg  40 mg Oral QPM Nanine MeansJamison Lord, NP   40 mg at 04/09/14 1719  . traZODone (DESYREL) tablet 100 mg  100 mg Oral QHS Nanine MeansJamison Lord, NP        Lab Results:  Results for orders placed during the hospital encounter of 04/06/14 (from the past 48 hour(s))  GLUCOSE, CAPILLARY     Status: Abnormal   Collection Time    04/08/14  5:04 PM      Result Value Ref Range   Glucose-Capillary 255 (*) 70 - 99 mg/dL  HEMOGLOBIN [id number]     Status: Abnormal    Collection Time    04/08/14  7:49 PM      Result Value Ref Range   Hemoglobin A1C 12.4 (*) <5.7 %   Comment: (NOTE)  According to the ADA Clinical Practice Recommendations for 2011, when     HbA1c is used as a screening test:      >=6.5%   Diagnostic of Diabetes Mellitus               (if abnormal result is confirmed)     5.7-6.4%   Increased risk of developing Diabetes Mellitus     References:Diagnosis and Classification of Diabetes Mellitus,Diabetes     Care,2011,34(Suppl 1):S62-S69 and Standards of Medical Care in             Diabetes - 2011,Diabetes Care,2011,34 (Suppl 1):S11-S61.   Mean Plasma Glucose 309 (*) <117 mg/dL   Comment: Performed at [facility]  GLUCOSE, CAPILLARY     Status: None   Collection Time    04/08/14  8:44 PM      Result Value Ref Range   Glucose-Capillary 80  70 - 99 mg/dL  GLUCOSE, CAPILLARY     Status: Abnormal   Collection Time    04/09/14  6:17 AM      Result Value Ref Range   Glucose-Capillary 49 (*) 70 - 99 mg/dL  GLUCOSE, CAPILLARY     Status: None   Collection Time    04/09/14  6:37 AM      Result Value Ref Range   Glucose-Capillary 77  70 - 99 mg/dL  GLUCOSE, CAPILLARY     Status: None   Collection Time    04/09/14  9:29 AM      Result Value Ref Range   Glucose-Capillary 74  70 - 99 mg/dL   Comment 1 Notify RN    GLUCOSE, CAPILLARY     Status: Abnormal   Collection Time    04/09/14 11:51 AM      Result Value Ref Range   Glucose-Capillary 119 (*) 70 - 99 mg/dL   Comment 1 Notify RN    GLUCOSE, CAPILLARY     Status: Abnormal   Collection Time    04/09/14  5:04 PM      Result Value Ref Range   Glucose-Capillary 128 (*) 70 - 99 mg/dL   Comment 1 Notify RN    GLUCOSE, CAPILLARY     Status: Abnormal   Collection Time    04/09/14  8:59 PM      Result Value Ref Range   Glucose-Capillary 124 (*) 70 - 99 mg/dL  GLUCOSE, CAPILLARY     Status: Abnormal    Collection Time    04/10/14  6:29 AM      Result Value Ref Range   Glucose-Capillary 137 (*) 70 - 99 mg/dL  GLUCOSE, CAPILLARY     Status: None   Collection Time    04/10/14 11:39 AM      Result Value Ref Range   Glucose-Capillary 87  70 - 99 mg/dL   Comment 1 Notify RN      Physical Findings: AIMS: Facial and Oral Movements Muscles of Facial Expression: None, normal Lips and Perioral Area: None, normal Jaw: None, normal Tongue: None, normal,Extremity Movements Upper (arms, wrists, hands, fingers): None, normal Lower (legs, knees, ankles, toes): None, normal, Trunk Movements Neck, shoulders, hips: None, normal, Overall Severity Severity of abnormal movements (highest score from questions above): None, normal Incapacitation due to abnormal movements: None, normal Patient's awareness of abnormal movements (rate only patient's report): No Awareness, Dental Status Current problems with teeth and/or dentures?: No Does patient usually wear dentures?: No  CIWA:  CIWA-Ar Total: 4 COWS:  Treatment Plan Summary: Daily contact with patient to assess and evaluate symptoms and progress in treatment Medication management, depression, suicidal thoughts anxiety and history of substance abuse  Plan of Care: 1. Continue crisis management and stabilization. 2. Continue medication management by assessing side effects, dose modification as needed and therapeutic blood levels as indicated. Continue Librium QID prn for alcohol withdrawal, Haldol 5 mg at hs for psychosis and Cogentin 1 mg hs for EPS prevention, Celexa 40 mg daily for depression.  3. Treat health problems as indicated or consult IM as needed. 4. Continue treatment plan to decrease risk of relapse upon discharge and to reduce the need for readmission to incorporate the use of local resources for support. She may benefit from direct admission to a rehab facility,if beds are available.  5. Psycho-social education regarding relapse  prevention through good self care to incorporate diet, exercise, stress reduction, good sleep hygiene, and reduction in external risk factors such as alcohol, tobacco, and substance abuse. 6. Gain consent for contact with family, PCP, or outside health provider as needed. 7. Review home medications as needed. 8. Disposition is in progress, may be discharged on Tuesday if she can contract for safety and able to show clinical improvement. Pt continues to endorse hearing voices at this time.   Medical Decision Making Problem Points:  Established problem, stable/improving (1), Review of last therapy session (1) and Review of psycho-social stressors (1) Data Points:  Review or order clinical lab tests (1) Review of medication regiment & side effects (2)  I certify that inpatient services furnished can reasonably be expected to improve the patient's condition.   Malachy Chamberakia Starkes, FNP-BC

## 2014-04-12 NOTE — Progress Notes (Signed)
D) Pt has been attending the groups and participating in them. Requested a 1:1 today stating "My mother is mad at me". Pt states, "it makes me very nervous when my mother is upset with me" Apparently Pt threw out some old frying pans that belonged to her 53 year old mother and Pt's mother was "raising cane with me". Affect is brighter with interaction today. Rates her depression at a 9 and her hopelessness at a 10. Has thoughts of SI on and off. A) Given support, reassurance and praise.  Provided with a 1:1.  R) Pt contracts fo rher safety while on the unit.

## 2014-04-12 NOTE — [facility] Group Notes (Signed)
[facility] LCSW Group Therapy Note   04/12/2014  1:15 to 2:00 PM   Type of Therapy and Topic: Group Therapy: Feelings Around Returning Home & Establishing a Supportive Framework and Activity to Identify signs of Improvement or Decompensation   Participation Level: Minimal  Mood:  Depressed AEB lack of eye contact and physically withdrawn from group by sitting in corner and curling up in chair  Description of Group:  Patients first processed thoughts and feelings about up coming discharge. These included fears of upcoming changes, lack of change, new living environments, judgements and expectations from others and overall stigma of MH issues. We then discussed what is a supportive framework? What does it look like feel like and how do I discern it from and unhealthy non-supportive network? Learn how to cope when supports are not helpful and don't support you. Discuss what to do when your family/friends are not supportive.   Therapeutic Goals Addressed in Processing Group:  1. Patient will identify one healthy supportive network that they can use at discharge. 2. Patient will identify one factor of a supportive framework and how to tell it from an unhealthy network. 3. Patient able to identify one coping skill to use when they do not have positive supports from others. 4. Patient will demonstrate ability to communicate their needs through discussion and/or role plays.  Summary of Patient Progress:  Pt was quiet during group session.  As other patients  processed their anxiety about discharge and described healthy supports [name] only shared when called upon. She [facility] which others [facility] support for and shared that her family will be main supports although there is often conflict there. Carney Bern.    Catherine C Harrill, LCSW

## 2014-04-12 NOTE — Progress Notes (Signed)
Psychoeducational Group Note  Psychoeducational Group Note  Date: 04/12/2014 Time  0930  Group Topic/Focus:  Gratefulness:  The focus of this group is to help patients identify what two things they are most grateful for in their lives. What helps ground them and to center them on their work to their recovery.  Participation Level:  Active  Participation Quality:  Appropriate  Affect:  Appropriate  Cognitive:  Oriented  Insight:  Improving  Engagement in Group:  Improving  Additional Comments:  Pt participated in the group.  Dione HousekeeperJudge, Miryam Mcelhinney A

## 2014-04-12 NOTE — Progress Notes (Signed)
Psychoeducational Group Note  Date:  04/12/2014 Time:  1015  Group Topic/Focus:  Making Healthy Choices:   The focus of this group is to help patients identify negative/unhealthy choices they were using prior to admission and identify positive/healthier coping strategies to replace them upon discharge.  Participation Level:  Active  Participation Quality:  Appropriate  Affect:  Appropriate  Cognitive:  Oriented  Insight:  Improving  Engagement in Group:  Engaged  Additional Comments:  Pt engaged in the group   Diana Santos A 04/12/2014 

## 2014-04-13 DIAGNOSIS — F339 Major depressive disorder, recurrent, unspecified: Secondary | ICD-10-CM

## 2014-04-13 LAB — GLUCOSE, CAPILLARY
GLUCOSE-CAPILLARY: 109 mg/dL — AB (ref 70–99)
GLUCOSE-CAPILLARY: 110 mg/dL — AB (ref 70–99)

## 2014-04-13 MED ORDER — BENZTROPINE MESYLATE 1 MG PO TABS: 1.0000 mg | ORAL_TABLET | Freq: Every day | ORAL | Status: DC

## 2014-04-13 MED ORDER — INSULIN GLARGINE 100 UNIT/ML ~~LOC~~ SOLN: 50.0000 [IU] | Freq: Every day | SUBCUTANEOUS | Status: DC

## 2014-04-13 MED ORDER — CITALOPRAM HYDROBROMIDE 40 MG PO TABS: 40.0000 mg | ORAL_TABLET | Freq: Every day | ORAL | Status: DC

## 2014-04-13 MED ORDER — HALOPERIDOL 5 MG PO TABS: 5.0000 mg | ORAL_TABLET | Freq: Every day | ORAL | Status: DC

## 2014-04-13 MED ORDER — GLUCERNA SHAKE PO LIQD
237.0000 mL | Freq: Three times a day (TID) | ORAL | Status: DC
  Administered 2014-04-13: 237 mL via ORAL

## 2014-04-13 MED ORDER — METFORMIN HCL 500 MG PO TABS: 1000.0000 mg | ORAL_TABLET | Freq: Two times a day (BID) | ORAL | Status: DC

## 2014-04-13 MED ORDER — TRAZODONE HCL 100 MG PO TABS: 100.0000 mg | ORAL_TABLET | Freq: Every day | ORAL | Status: DC

## 2014-04-13 MED ORDER — OMEPRAZOLE 20 MG PO CPDR: 20.0000 mg | DELAYED_RELEASE_CAPSULE | Freq: Every day | ORAL | Status: DC

## 2014-04-13 MED ORDER — GLUCERNA SHAKE PO LIQD: 237.0000 mL | Freq: Three times a day (TID) | ORAL | Status: DC

## 2014-04-13 NOTE — Progress Notes (Signed)
Patient ID: Diana FridayCarrie Lux, female   DOB: 06/26/1961, 53 y.o.   MRN: [id number]  Pt. Denies SI/HI and A/V hallucinations. Patient reports depression 9/10 and her hopelessness at 8/10 for the day. Patient and writer had 1:1 about patient's diabetes management. Writer verbalized the importance of taking insulin even on "sick days." Patient verbalized understanding. Belongings returned to patient at time of discharge. Patient denies any pain or discomfort. Discharge instructions and medications were reviewed with patient. Patient verbalized understanding of both medications and discharge instructions. Q15 minute safety checks until discharge. No distress noted upon discharge.

## 2014-04-13 NOTE — Discharge Summary (Signed)
Physician Discharge Summary Note  Patient:  Diana Santos is an 53 y.o., female MRN:  [id number] DOB:  12/08/1960 Patient phone:  [phone] (home)  Patient address:   [address] [place] [address],  Total Time spent with patient: 30 minutes  Date of Admission:  04/06/2014 Date of Discharge: 04/13/2014  Reason for Admission:  Bipolar disorder with depression and SI with plan  Discharge Diagnoses: Principal Problem:   Severe major depression with psychotic features Active Problems:   Alcohol dependence   Diabetes   Psychiatric Specialty Exam:    Please see D/C SRA Physical Exam  ROS  Blood pressure 128/76, pulse 68, temperature 97.7 F (36.5 C), temperature source Oral, resp. rate 16, height 5\' 4"  (1.626 m), weight 59.875 kg (132 lb), last menstrual period 06/30/2013.Body mass index is 22.65 kg/(m^2).    Past Psychiatric History:  Diagnosis: Bipolar disorder, alcohol dependence   Hospitalizations: [facility], [place] [facility]   Outpatient Care:[facility]   Substance Abuse Care: Cocaine and alcohol, [facility], [facility]   Self-Mutilation: NO   Suicidal Attempts: yes   Violent Behaviors: NO    DSM5:  Schizophrenia Disorders:   Obsessive-Compulsive Disorders:   Trauma-Stressor Disorders:   Substance/Addictive Disorders:   Depressive Disorders:    Axis Diagnosis:  Discharge Diagnoses:  AXIS I: Alcohol Dependence, Cocaine Dependence, major Depression recurrent  AXIS II: No diagnosis  AXIS III:  Past Medical History   Diagnosis  Date   .  Diabetes mellitus without complication    .  Hypertension    .  Asthma    .  Arthritis    .  Bipolar 1 disorder, depressed     AXIS IV: other psychosocial or environmental problems  AXIS V: 61-70 mild symptoms      Level of Care:  OP  Hospital Course:  Diana FridayCarrie Santos is a 53 year old AAF who [facility] with [facility] intoxication and bipolar depression. 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   Consults:  None  Significant Diagnostic Studies:  labs: CBGs, HgbA1c  Discharge Vitals:   Blood pressure 128/76, pulse 68, temperature 97.7 F (36.5 C), temperature source Oral, resp. rate 16, height 5\' 4"  (1.626 m), weight 59.875 kg (132 lb), last menstrual period 06/30/2013. Body mass index is 22.65 kg/(m^2). Lab Results:   Results for orders placed during the hospital encounter of 04/06/14 (from the past 72 hour(s))  GLUCOSE, CAPILLARY     Status: Abnormal   Collection Time    04/10/14  5:05 PM      Result Value Ref Range   Glucose-Capillary 158 (*) 70 - 99 mg/dL  GLUCOSE, CAPILLARY     Status: None   Collection Time    04/10/14  8:21 PM      Result Value Ref Range   Glucose-Capillary 83  70 - 99 mg/dL  GLUCOSE, CAPILLARY     Status: Abnormal   Collection Time    04/10/14  9:05 PM      Result Value Ref Range   Glucose-Capillary 112 (*) 70 - 99 mg/dL  GLUCOSE, CAPILLARY     Status: None   Collection Time    04/11/14  6:11 AM      Result Value Ref Range   Glucose-Capillary 73  70 - 99 mg/dL  GLUCOSE, CAPILLARY     Status: Abnormal   Collection Time  04/11/14 11:51 AM      Result Value Ref Range   Glucose-Capillary 186 (*) 70 - 99 mg/dL   Comment 1 Notify RN    GLUCOSE, CAPILLARY     Status: Abnormal   Collection Time    04/11/14  4:55 PM      Result Value Ref Range   Glucose-Capillary 161 (*) 70 - 99 mg/dL   Comment 1 Notify RN    GLUCOSE, CAPILLARY     Status: Abnormal   Collection Time    04/11/14  9:04 PM      Result Value Ref Range   Glucose-Capillary 100 (*) 70 - 99 mg/dL  GLUCOSE, CAPILLARY     Status: None   Collection Time    04/12/14  6:14 AM      Result Value Ref Range   Glucose-Capillary 87  70 - 99 mg/dL   Comment 1 Notify RN    GLUCOSE, CAPILLARY     Status: Abnormal   Collection Time    04/12/14 12:01 PM       Result Value Ref Range   Glucose-Capillary 132 (*) 70 - 99 mg/dL   Comment 1 Notify RN    GLUCOSE, CAPILLARY     Status: Abnormal   Collection Time    04/12/14  5:01 PM      Result Value Ref Range   Glucose-Capillary 143 (*) 70 - 99 mg/dL   Comment 1 Notify RN    GLUCOSE, CAPILLARY     Status: Abnormal   Collection Time    04/12/14  8:43 PM      Result Value Ref Range   Glucose-Capillary 148 (*) 70 - 99 mg/dL  GLUCOSE, CAPILLARY     Status: Abnormal   Collection Time    04/13/14  5:56 AM      Result Value Ref Range   Glucose-Capillary 109 (*) 70 - 99 mg/dL   Comment 1 Notify RN    GLUCOSE, CAPILLARY     Status: Abnormal   Collection Time    04/13/14 11:51 AM      Result Value Ref Range   Glucose-Capillary 110 (*) 70 - 99 mg/dL   Comment 1 Notify RN      Physical Findings: AIMS: Facial and Oral Movements Muscles of Facial Expression: None, normal Lips and Perioral Area: None, normal Jaw: None, normal Tongue: None, normal,Extremity Movements Upper (arms, wrists, hands, fingers): None, normal Lower (legs, knees, ankles, toes): None, normal, Trunk Movements Neck, shoulders, hips: None, normal, Overall Severity Severity of abnormal movements (highest score from questions above): None, normal Incapacitation due to abnormal movements: None, normal Patient's awareness of abnormal movements (rate only patient's report): No Awareness, Dental Status Current problems with teeth and/or dentures?: No Does patient usually wear dentures?: No  CIWA:  CIWA-Ar Total: 4 COWS:     Psychiatric Specialty Exam: See Psychiatric Specialty Exam and Suicide Risk Assessment completed by Attending Physician prior to discharge.  Discharge destination:  Home  Is patient on multiple antipsychotic therapies at discharge:  No   Has Patient had three or more failed trials of antipsychotic monotherapy by history:  No  Recommended Plan for Multiple Antipsychotic Therapies: NA  Discharge  Instructions   Diet - low sodium heart healthy    Complete by:  As directed      Discharge instructions    Complete by:  As directed   Take all of your medications as directed. Be sure to keep all of your follow up appointments.  If you are unable to keep your follow up appointment, call your Doctor's office to let them know, and reschedule.  Make sure that you have enough medication to last until your appointment. Be sure to get plenty of rest. Going to bed at the same time each night will help. Try to avoid sleeping during the day.  Increase your activity as tolerated. Regular exercise will help you to sleep better and improve your mental health. Eating a heart healthy diet is recommended. Try to avoid salty or fried foods. Be sure to avoid all alcohol and illegal drugs.     Increase activity slowly    Complete by:  As directed             Medication List       Indication   benztropine 1 MG tablet  Commonly known as:  COGENTIN  Take 1 tablet (1 mg total) by mouth at bedtime.   Indication:  Extrapyramidal Reaction caused by Medications     citalopram 40 MG tablet  Commonly known as:  CELEXA  Take 1 tablet (40 mg total) by mouth daily.   Indication:  Depression     haloperidol 5 MG tablet  Commonly known as:  HALDOL  Take 1 tablet (5 mg total) by mouth at bedtime.   Indication:  Psychosis     insulin aspart 100 UNIT/ML injection  Commonly known as:  novoLOG  Inject 4-15 Units into the skin daily. Diabetes management   Indication:  Type 2 Diabetes     insulin glargine 100 UNIT/ML injection  Commonly known as:  LANTUS  Inject 0.5 mLs (50 Units total) into the skin at bedtime.   Indication:  Type 2 Diabetes     lisinopril 10 MG tablet  Commonly known as:  PRINIVIL,ZESTRIL  Take 1 tablet (10 mg total) by mouth daily. For high blood pressure management   Indication:  High Blood Pressure     metFORMIN 500 MG tablet  Commonly known as:  GLUCOPHAGE  Take 2 tablets (1,000 mg  total) by mouth 2 (two) times daily with a meal.   Indication:  Type 2 Diabetes     omeprazole 20 MG capsule  Commonly known as:  PRILOSEC  Take 1 capsule (20 mg total) by mouth daily.   Indication:  Gastroesophageal Reflux Disease with Current Symptoms     simvastatin 40 MG tablet  Commonly known as:  ZOCOR  Take 1 tablet (40 mg total) by mouth every evening. For high Cholesterol control   Indication:  Inherited Homozygous Hypercholesterolemia, Nonfamilial Heterozygous Hypercholesterolemia     traZODone 100 MG tablet  Commonly known as:  DESYREL  Take 1 tablet (100 mg total) by mouth at bedtime.   Indication:  Trouble Sleeping           Follow-up Information   Follow up with [facility]. (Call Efraim KaufmannMelissa [phone]) to inform her you want to stay on the list and with your contact info.  )    Contact information:   [address] [place], [place] [address] Phone: [phone] Fax: [phone]      Follow up with [facility] On 04/14/2014. (Walk in between 8am-3pm Monday through Friday for hospital follow-up/medication management/assessment for therapy services. )    Contact information:   [address] [place], [place] [address] Phone: [phone] Fax: [phone]      Follow-up recommendations:   Activities: Resume activity as tolerated. Diet: Heart healthy low sodium diet Tests: Follow up testing will be determined  by your out patient provider. Comments:    Total Discharge Time:  Less than 30 minutes.  Signed:  Rona Ravens. Mashburn RPAC 12:10 PM 04/13/2014  Personally evaluated the patient and agree with assessment and plan Madie Reno A. Dub Mikes, M.D.

## 2014-04-13 NOTE — Progress Notes (Addendum)
[facility] Discharge Plan :  Will you be returning to the same living situation after discharge: Yes,  pt can return to mom's house while she waits for treatment.  If not, CSW provided pt with a list of resources for homeless At discharge, do you have transportation home?:Yes,  provided pt with bus pass and [facility] Do you have the ability to pay for your medications:Yes,  provided pt with samples and prescriptions and referred pt to [facility] for assistance with affording meds  Release of information consent forms completed and in the chart;  Patient's signature needed at discharge.  Patient to Follow up at: Follow-up Information   Follow up with [facility]. (Call Efraim KaufmannMelissa [phone]) to inform her you want to stay on the list and with your contact info.  )    Contact information:   [address] [place], [place] [address] Phone: [phone] Fax: [phone]      Follow up with [facility] On 04/14/2014. (Walk in between 8am-3pm Monday through Friday for hospital follow-up/medication management/assessment for therapy services. )    Contact information:   [address] [place], [place] [address] Phone: [phone] Fax: [phone]      Patient denies SI/HI:   Yes,  deines SI/HI    Safety Planning and Suicide Prevention discussed:  Yes,  discussed with pt and pt's mother.  See suicide prevention education note.   CSW spoke with Melissa in [facility] this morning.  Referral was made on 5/13 and pt is on the waiting list.  Pt made aware of how to get in from home.    Kameron Glazebrook N Horton 04/13/2014, 12:24 PM

## 2014-04-13 NOTE — [facility] Group Notes (Signed)
[facility]   04/13/2014 8:45 AM  Participation Quality:  Alert, Appropriate and Oriented  Mood/Affect:  Calm  Depression Rating:  6  Anxiety Rating:  6  Thoughts of Suicide:  Pt denies SI/HI  Will you contract for safety?   Yes  Current AVH:  Pt denies  Plan for Discharge/Comments:  Pt attended discharge planning group and actively participated in group.  CSW provided pt with today's workbook.  Pt reports feeling ready to d/c today.  Pt states that she is okay with staying with her mom while [facility].  CSW called to check on [facility] as an option and they do not have treatment for women there, only detox.  Pt not appropriate for [facility] referral due to primary diagnosis of mental health. [facility] also [facility] and therapy.  No further needs voiced by pt at this time.    Transportation Means: Pt reports access to transportation -  Bus pass and PART bus fare  Supports: No supports mentioned at this time  Reyes IvanChelsea Horton, LCSW 04/13/2014 9:28 AM

## 2014-04-13 NOTE — Tx Team (Signed)
Interdisciplinary Treatment Plan Update (Adult)  Date: 04/13/2014  Time Reviewed:  9:45 AM  Progress in Treatment: Attending groups: Yes Participating in groups:  Yes Taking medication as prescribed:  Yes Tolerating medication:  Yes Family/Significant othe contact made: Yes, with pt's mom Patient understands diagnosis:  Yes Discussing patient identified problems/goals with staff:  Yes Medical problems stabilized or resolved:  Yes Denies suicidal/homicidal ideation: Yes Issues/concerns per patient self-inventory:  Yes Other:  New problem(s) identified: N/A  Discharge Plan or Barriers: [facility], calling daily and [facility] for outpatient medication management and therapy.    Reason for Continuation of Hospitalization: Stable to d/c today  Comments: N/A  Estimated length of stay: D/C today  For review of initial/current patient goals, please see plan of care.  Attendees: Patient:  Diana FridayCarrie Riemann  04/13/2014 10:35 AM   Family:     Physician:     Nursing:   Marzetta Boardhrista Dopson, RN 04/13/2014 10:24 AM   Clinical Social Worker:  Reyes Ivanhelsea Horton, LCSW 04/13/2014 10:24 AM   Other: Verne SpurrNeil Mashburn, PA 04/13/2014 10:24 AM   Other:  Quintella ReichertBeverly Knight, RN 04/13/2014 10:24 AM   Other:  Neill Loftarol Davis, RN 04/13/2014 10:24 AM   Other:  Onnie BoerJennifer Clark, UR case manager 04/13/2014 10:25 AM   Other:    Other:    Other:    Other:    Other:    Other:     Scribe for Treatment Team:   Carmina MillerHorton, Gwen Sarvis Nicole, 04/13/2014 10:24 AM

## 2014-04-13 NOTE — [facility] Group Notes (Signed)
[facility] LCSW Group Therapy  04/13/2014   1:15 PM   Type of Therapy:  Group Therapy  Participation Level:  Active  Participation Quality:  Attentive, Sharing and Supportive  Affect:  Depressed and Flat  Cognitive:  Alert and Oriented  Insight:  Developing/Improving and Engaged  Engagement in Therapy:  Developing/Improving and Engaged  Modes of Intervention:  Clarification, Confrontation, Discussion, Education, Exploration, Limit-setting, Orientation, Problem-solving, Rapport Building, Dance movement psychotherapisteality Testing, Socialization and Support  Summary of Progress/Problems: Pt identified obstacles faced currently and processed barriers involved in overcoming these obstacles. Pt identified steps necessary for overcoming these obstacles and explored motivation (internal and external) for facing these difficulties head on. Pt further identified one area of concern in their lives and chose a goal to focus on for today.  Pt shared that her biggest obstacle is dealing with her mother.  Pt explained that her mother is not supportive but has to accept this and focus on her own recovery.  Pt actively participated and was engaged in group discussion.    Reyes IvanChelsea Horton, LCSW 04/13/2014  2:11 PM

## 2014-04-13 NOTE — Progress Notes (Signed)
Writer spoke with patient 1:1 after she received her hs medications. She reports that she did better today and attended a few groups today but not all of them. She reports that she has still been having problems with feeling nauseated and a decreased appetite. She refused her lantus tonight reporting that since she has not eaten that much today she doesn't want her blood sugar to drop over in the early morning. Writer encouraged her to talk with her doctor in the morning concerning this issue. She has been isolative to her room most of the evening, lying in bed asleep of and on. She currently denies si/hi/a/v hallucinations. Safety maintained on unit with 15 min checks.

## 2014-04-13 NOTE — [facility] Suicide Risk Assessment (Signed)
Suicide Risk Assessment  Discharge Assessment     Demographic Factors:  NA  Total Time spent with patient: 45 minutes  Psychiatric Specialty Exam:     Blood pressure 128/76, pulse 68, temperature 97.7 F (36.5 C), temperature source Oral, resp. rate 16, height 5\' 4"  (1.626 m), weight 59.875 kg (132 lb), last menstrual period 06/30/2013.Body mass index is 22.65 kg/(m^2).  General Appearance: Fairly Groomed  Patent attorneyye Contact::  Fair  Speech:  Clear and Coherent  Volume:  Normal  Mood:  Anxious and worried  Affect:  anxious, worried  Thought Process:  Coherent and Goal Directed  Orientation:  Full (Time, Place, and Person)  Thought Content:  relapse prevention plan/lack of support from family  Suicidal Thoughts:  No  Homicidal Thoughts:  No  Memory:  Immediate;   Fair Recent;   Fair Remote;   Fair  Judgement:  Fair  Insight:  Present  Psychomotor Activity:  Restlessness  Concentration:  Fair  Recall:  Fis[employer]Fair  Fund of Knowledge:NA  Language: Fair  Akathisia:  No  Handed:    AIMS (if indicated):     Assets:  Desire for Improvement  Sleep:  Number of Hours: 6.75    Musculoskeletal: Strength & Muscle Tone: within normal limits Gait & Station: normal Patient leans: N/A   Mental Status Per Nursing Assessment::   On Admission:  NA (has had thoughts of suicide but not currently)  Current Mental Status by Physician: In full contact with reality. There are no active S/S of withdrawal. There are no active SI plans or intent. She is willing and motivated to pursue further work on her abstinence. [facility].    Loss Factors: Decline in physical health, Financial problems/change in socioeconomic status and NA  Historical Factors: NA  Risk Reduction Factors:   NA  Continued Clinical Symptoms:  Depression:   Comorbid alcohol abuse/dependence Alcohol/Substance Abuse/Dependencies  Cognitive Features That Contribute To Risk:   Closed-mindedness Polarized thinking Thought constriction (tunnel vision)    Suicide Risk:  Minimal: No identifiable suicidal ideation.  Patients presenting with no risk factors but with morbid ruminations; may be classified as minimal risk based on the severity of the depressive symptoms  Discharge Diagnoses:   AXIS I:  Alcohol Dependence, Cocaine Dependence, major Depression recurrent AXIS II:  No diagnosis AXIS III:   Past Medical History  Diagnosis Date  . Diabetes mellitus without complication   . Hypertension   . Asthma   . Arthritis   . Bipolar 1 disorder, depressed    AXIS IV:  other psychosocial or environmental problems AXIS V:  61-70 mild symptoms  Plan Of Care/Follow-up recommendations:  Activity:  as tolerated Diet:  as per the Dietitian Follow up outpatient [facility] Is patient on multiple antipsychotic therapies at discharge:  No   Has Patient had three or more failed trials of antipsychotic monotherapy by history:  No  Recommended Plan for Multiple Antipsychotic Therapies: NA    Rachael FeeIrving A Johntavius Shepard 04/13/2014, 2:28 PM

## 2014-04-16 NOTE — Progress Notes (Signed)
Patient Discharge Instructions:  After Visit Summary (AVS):   Faxed to:  04/16/14 Discharge Summary Note:   Faxed to:  04/16/14 Psychiatric Admission Assessment Note:   Faxed to:  04/16/14 Suicide Risk Assessment - Discharge Assessment:   Faxed to:  04/16/14 Faxed/Sent to the Next Level Care provider:  04/16/14 Faxed to [facility] @ [phone] [facility]axed to [facility] @ [phone]  Jerelene ReddenSheena E [place], 04/16/2014, 3:16 PM

## 2015-02-24 ENCOUNTER — Encounter (HOSPITAL_COMMUNITY): Payer: Self-pay | Admitting: Emergency Medicine

## 2015-02-24 ENCOUNTER — Emergency Department (HOSPITAL_COMMUNITY)
Admission: EM | Admit: 2015-02-24 | Discharge: 2015-02-26 | Disposition: A | Discharge status: Home / Self Care | Payer: Medicaid Other | Attending: Emergency Medicine | Admitting: Emergency Medicine

## 2015-02-24 DIAGNOSIS — E119 Type 2 diabetes mellitus without complications: Secondary | ICD-10-CM | POA: Insufficient documentation

## 2015-02-24 DIAGNOSIS — F1020 Alcohol dependence, uncomplicated: Secondary | ICD-10-CM | POA: Diagnosis present

## 2015-02-24 DIAGNOSIS — Z720 Tobacco use: Secondary | ICD-10-CM | POA: Insufficient documentation

## 2015-02-24 DIAGNOSIS — Z79899 Other long term (current) drug therapy: Secondary | ICD-10-CM | POA: Insufficient documentation

## 2015-02-24 DIAGNOSIS — F1310 Sedative, hypnotic or anxiolytic abuse, uncomplicated: Secondary | ICD-10-CM | POA: Insufficient documentation

## 2015-02-24 DIAGNOSIS — Z794 Long term (current) use of insulin: Secondary | ICD-10-CM | POA: Insufficient documentation

## 2015-02-24 DIAGNOSIS — I10 Essential (primary) hypertension: Secondary | ICD-10-CM | POA: Insufficient documentation

## 2015-02-24 DIAGNOSIS — J45909 Unspecified asthma, uncomplicated: Secondary | ICD-10-CM | POA: Insufficient documentation

## 2015-02-24 DIAGNOSIS — F1010 Alcohol abuse, uncomplicated: Secondary | ICD-10-CM | POA: Insufficient documentation

## 2015-02-24 DIAGNOSIS — F1410 Cocaine abuse, uncomplicated: Secondary | ICD-10-CM | POA: Insufficient documentation

## 2015-02-24 DIAGNOSIS — F319 Bipolar disorder, unspecified: Secondary | ICD-10-CM | POA: Insufficient documentation

## 2015-02-24 DIAGNOSIS — Z8739 Personal history of other diseases of the musculoskeletal system and connective tissue: Secondary | ICD-10-CM | POA: Insufficient documentation

## 2015-02-24 LAB — CBC WITH DIFFERENTIAL/PLATELET
Basophils Absolute: 0 10*3/uL (ref 0.0–0.1)
Basophils Relative: 0 % (ref 0–1)
Eosinophils Absolute: 0.1 10*3/uL (ref 0.0–0.7)
Eosinophils Relative: 2 % (ref 0–5)
HCT: 30 % — ABNORMAL LOW (ref 36.0–46.0)
Hemoglobin: 9.4 g/dL — ABNORMAL LOW (ref 12.0–15.0)
Lymphocytes Relative: 36 % (ref 12–46)
Lymphs Abs: 2.7 10*3/uL (ref 0.7–4.0)
MCH: 25.7 pg — ABNORMAL LOW (ref 26.0–34.0)
MCHC: 31.3 g/dL (ref 30.0–36.0)
MCV: 82 fL (ref 78.0–100.0)
Monocytes Absolute: 0.4 10*3/uL (ref 0.1–1.0)
Monocytes Relative: 6 % (ref 3–12)
Neutro Abs: 4.2 10*3/uL (ref 1.7–7.7)
Neutrophils Relative %: 56 % (ref 43–77)
Platelets: 442 10*3/uL — ABNORMAL HIGH (ref 150–400)
RBC: 3.66 MIL/uL — ABNORMAL LOW (ref 3.87–5.11)
RDW: 15.6 % — ABNORMAL HIGH (ref 11.5–15.5)
WBC: 7.5 10*3/uL (ref 4.0–10.5)

## 2015-02-24 LAB — COMPREHENSIVE METABOLIC PANEL
ALT: 18 U/L (ref 0–35)
AST: 20 U/L (ref 0–37)
Albumin: 3.8 g/dL (ref 3.5–5.2)
Alkaline Phosphatase: 87 U/L (ref 39–117)
Anion gap: 7 (ref 5–15)
BUN: 9 mg/dL (ref 6–23)
CO2: 27 mmol/L (ref 19–32)
Calcium: 8.6 mg/dL (ref 8.4–10.5)
Chloride: 101 mmol/L (ref 96–112)
Creatinine, Ser: 0.69 mg/dL (ref 0.50–1.10)
GFR calc Af Amer: >90 mL/min (ref >90)
GFR calc non Af Amer: >90 mL/min (ref >90)
Glucose, Bld: 213 mg/dL — ABNORMAL HIGH (ref 70–99)
Potassium: 3.9 mmol/L (ref 3.5–5.1)
Sodium: 135 mmol/L (ref 135–145)
Total Bilirubin: 0.1 mg/dL — ABNORMAL LOW (ref 0.3–1.2)
Total Protein: 7.8 g/dL (ref 6.0–8.3)

## 2015-02-24 LAB — RAPID URINE DRUG SCREEN, HOSP PERFORMED
Amphetamines: NOT DETECTED
Barbiturates: NOT DETECTED
Benzodiazepines: POSITIVE — AB
Cocaine: POSITIVE — AB
Opiates: NOT DETECTED
Tetrahydrocannabinol: NOT DETECTED

## 2015-02-24 LAB — CBG MONITORING, ED
Glucose-Capillary: 149 mg/dL — ABNORMAL HIGH (ref 70–99)
Glucose-Capillary: 158 mg/dL — ABNORMAL HIGH (ref 70–99)
Glucose-Capillary: 139 mg/dL — ABNORMAL HIGH (ref 70–99)
Glucose-Capillary: 158 mg/dL — ABNORMAL HIGH (ref 70–99)

## 2015-02-24 LAB — ETHANOL: Alcohol, Ethyl (B): <5 mg/dL (ref 0–9)

## 2015-02-24 MED ORDER — LISINOPRIL 10 MG PO TABS
10.0000 mg | ORAL_TABLET | Freq: Every day | ORAL | Status: DC
  Administered 2015-02-24 – 2015-02-26 (×3): 10 mg via ORAL
  Filled 2015-02-24 (×4): qty 1

## 2015-02-24 MED ORDER — SIMVASTATIN 40 MG PO TABS
40.0000 mg | ORAL_TABLET | Freq: Every evening | ORAL | Status: DC
  Administered 2015-02-24 – 2015-02-25 (×2): 40 mg via ORAL
  Filled 2015-02-24 (×3): qty 1

## 2015-02-24 MED ORDER — METFORMIN HCL 500 MG PO TABS
1000.0000 mg | ORAL_TABLET | Freq: Two times a day (BID) | ORAL | Status: DC
  Administered 2015-02-24 – 2015-02-26 (×5): 1000 mg via ORAL
  Filled 2015-02-24 (×8): qty 2

## 2015-02-24 MED ORDER — LORAZEPAM 1 MG PO TABS
1.0000 mg | ORAL_TABLET | Freq: Once | ORAL | Status: AC
  Administered 2015-02-24: 1 mg via ORAL
  Filled 2015-02-24: qty 1

## 2015-02-24 MED ORDER — PANTOPRAZOLE SODIUM 40 MG PO TBEC
40.0000 mg | DELAYED_RELEASE_TABLET | Freq: Every day | ORAL | Status: DC
  Administered 2015-02-24 – 2015-02-26 (×3): 40 mg via ORAL
  Filled 2015-02-24 (×3): qty 1

## 2015-02-24 MED ORDER — INSULIN ASPART 100 UNIT/ML ~~LOC~~ SOLN
0.0000 [IU] | Freq: Three times a day (TID) | SUBCUTANEOUS | Status: DC
  Administered 2015-02-24: 3 [IU] via SUBCUTANEOUS
  Administered 2015-02-24 – 2015-02-25 (×2): 2 [IU] via SUBCUTANEOUS
  Administered 2015-02-25: 3 [IU] via SUBCUTANEOUS
  Administered 2015-02-26: 100 [IU] via SUBCUTANEOUS
  Filled 2015-02-24 (×3): qty 1

## 2015-02-24 MED ORDER — TRAZODONE HCL 100 MG PO TABS
100.0000 mg | ORAL_TABLET | Freq: Every day | ORAL | Status: DC
  Administered 2015-02-24 – 2015-02-25 (×2): 100 mg via ORAL
  Filled 2015-02-24 (×2): qty 1

## 2015-02-24 MED ORDER — BENZTROPINE MESYLATE 1 MG PO TABS
1.0000 mg | ORAL_TABLET | Freq: Every day | ORAL | Status: DC
  Administered 2015-02-24 – 2015-02-25 (×2): 1 mg via ORAL
  Filled 2015-02-24 (×2): qty 1

## 2015-02-24 MED ORDER — ADULT MULTIVITAMIN W/MINERALS CH
1.0000 | ORAL_TABLET | Freq: Once | ORAL | Status: AC
  Administered 2015-02-24: 1 via ORAL
  Filled 2015-02-24: qty 1

## 2015-02-24 MED ORDER — THIAMINE HCL 100 MG/ML IJ SOLN: 100.0000 mg | Freq: Every day | INTRAMUSCULAR | Status: DC

## 2015-02-24 MED ORDER — VITAMIN B-1 100 MG PO TABS
100.0000 mg | ORAL_TABLET | Freq: Every day | ORAL | Status: DC
  Administered 2015-02-24 – 2015-02-26 (×3): 100 mg via ORAL
  Filled 2015-02-24 (×3): qty 1

## 2015-02-24 MED ORDER — HALOPERIDOL 5 MG PO TABS
5.0000 mg | ORAL_TABLET | Freq: Every day | ORAL | Status: DC
  Administered 2015-02-24 – 2015-02-25 (×2): 5 mg via ORAL
  Filled 2015-02-24 (×2): qty 1

## 2015-02-24 MED ORDER — CITALOPRAM HYDROBROMIDE 40 MG PO TABS
40.0000 mg | ORAL_TABLET | Freq: Every day | ORAL | Status: DC
  Administered 2015-02-24 – 2015-02-26 (×3): 40 mg via ORAL
  Filled 2015-02-24 (×4): qty 1

## 2015-02-24 NOTE — BH Assessment (Signed)
Writer informed [facility] Diana Foot([place]) of the consult.  Diana Santos was not notified of this consult until 10:33a.m.

## 2015-02-24 NOTE — ED Notes (Signed)
Pt requesting detox from [facility].  [place] [facility] and called [facility] but was told that she needs detox first so she was brought here.   Pt states that she drinks on average 5-6 40oz beers.  Pt's last drink was last night.

## 2015-02-24 NOTE — BH Assessment (Signed)
Assessment Note  Diana Santos is an 54 y.o. African American  female presenting to [facility]. Pt requesting detox from ETOH. Pt states that she went to the shelter here in [place] from [facility] and called [facility] but was told that she needs detox first so she was brought here.Writer met with patient to complete a [facility] assessment. Pt states that she drinks on average 4-5 40oz beers daily for the past 2 months.  Pt's last drink was last night. She also reports cocaine use 2-3x's per week. Patient sts that she uses a small amount. His last use of cocaine was last night. Current withdrawal symptoms include: cold chills, sweats, and tremors. No seizure history. Patient has a history of black outs. She denies SI, HI, and AVH's Patient reports several depressive symptoms: hopelessness, fatigue, crying spells, and guilt. Patient reports increased anxiety with associated panic attacks. Last panic attack was yesterday. Current stressors consist of limited support from friends/family and drug use. She does not have a current outpatient provider. She does have a diagnosis of Bipolar 1 Disorder with depressed mood. Patient sts that she has received inpatient services at [facility] (08/26/2013 and 04/06/2014), [facility] ("2 yrs ago), and [facility] ("3-4 yrs ago".  Axis I: Depressive Disorder NOS; Anxiety Disorder;  Alcohol Dependence; and Cocaine Abuse.  Axis II: Deferred Axis III:  Past Medical History  Diagnosis Date  . Diabetes mellitus without complication   . Hypertension   . Asthma   . Arthritis   . Bipolar 1 disorder, depressed    Axis IV: other psychosocial or environmental problems, problems related to social environment, problems with access to health care services and problems with primary support group Axis V: 41-50 serious symptoms  Past Medical History:  Past Medical History  Diagnosis Date  . Diabetes mellitus without complication   . Hypertension   . Asthma   . Arthritis   . Bipolar 1 disorder,  depressed     Past Surgical History  Procedure Laterality Date  . C section      Family History: No family history on file.  Social History:  reports that she has been smoking Cigarettes.  She does not have any smokeless tobacco history on file. She reports that she drinks about 9.0 oz of alcohol per week. She reports that she uses illicit drugs (Cocaine).  Additional Social History:  Alcohol / Drug Use Pain Medications: SEE MAR Prescriptions: SEE MAR Over the Counter: SEE MAR History of alcohol / drug use?: Yes Longest period of sobriety (when/how long): "few months" Substance #1 Name of Substance 1: Alcohol  1 - Age of First Use: 54 yrs old  1 - Amount (size/oz): (4-5) 40 oz beers 1 - Frequency: daily  1 - Duration: 2 months 1 - Last Use / Amount: 02/23/2015; 40 oz beer and (2) 12 oz beers Substance #2 Name of Substance 2: Cocaine  2 - Age of First Use: 54 yrs old  2 - Amount (size/oz): "A little it...not much" 2 - Frequency: 2-3x's per week  2 - Duration: on-going since age 32 2 - Last Use / Amount: 02/23/2015; "I just used a little bit"  CIWA: CIWA-Ar BP: 147/89 mmHg Pulse Rate: 69 Nausea and Vomiting: 2 Tactile Disturbances: mild itching, pins and needles, burning or numbness Tremor: no tremor Auditory Disturbances: not present Paroxysmal Sweats: no sweat visible Visual Disturbances: not present Anxiety: two Headache, Fullness in Head: none present Agitation: two Orientation and Clouding of Sensorium: oriented and can do serial additions CIWA-Ar  Total: 8 COWS:    Allergies:  Allergies  Allergen Reactions  . Aspirin Anaphylaxis and Swelling    Home Medications:  (Not in a hospital admission)  OB/GYN Status:  Patient's last menstrual period was 06/30/2013.  General Assessment Data Location of Assessment: [facility] ED (Dr. Darleene Cleaver & Theodoro Clock, DNP recommend [facility] or [facility]) ACT Assessment: Yes Is this a Tele or Face-to-Face Assessment?: Face-to-Face Is this an  Initial Assessment or a Re-assessment for this encounter?: Initial Assessment Living Arrangements: Parent, Other (Comment) (mother ) Can pt return to current living arrangement?: Yes Admission Status: Voluntary Is patient capable of signing voluntary admission?: Yes Transfer from: [facility] Referral Source: Self/Family/Friend     [facility] Living Arrangements: Parent, Other (Comment) (mother ) Name of Psychiatrist:  (No psychiatrist ) Name of Therapist:  (No therapist )  Education Status Is patient currently in school?: No  Risk to self with the past 6 months Suicidal Ideation: No Suicidal Intent: No Is patient at risk for suicide?: No Suicidal Plan?: No Access to Means: No What has been your use of drugs/alcohol within the last 12 months?:  (alcohol and cocaine ) Previous Attempts/Gestures: Yes How many times?:  (1x- "Yrs ago I tried to cut my wrist") Other Self Harm Risks:  (non reported ) Triggers for Past Attempts: Other (Comment) ("I can't remember") Intentional Self Injurious Behavior: None Family Suicide History: Unknown Recent stressful life event(s): Other (Comment) (minimum support from family and drug/alcohol use) Persecutory voices/beliefs?: No Depression: Yes Depression Symptoms: Feeling angry/irritable, Feeling worthless/self pity, Loss of interest in usual pleasures, Isolating, Guilt, Fatigue, Tearfulness, Insomnia, Despondent Substance abuse history and/or treatment for substance abuse?: No Suicide prevention information given to non-admitted patients: Not applicable  Risk to Others within the past 6 months Homicidal Ideation: No Thoughts of Harm to Others: No Current Homicidal Intent: No Current Homicidal Plan: No Access to Homicidal Means: No Identified Victim:  (n/a) History of harm to others?: No Assessment of Violence: None Noted Violent Behavior Description:  (patient is calm and cooperative) Does patient have access to weapons?:  No Criminal Charges Pending?: No Does patient have a court date: No  Psychosis Hallucinations: None noted Delusions: None noted  Mental Status Report Appearance/Hygiene: Disheveled Eye Contact: Good Motor Activity: Freedom of movement Speech: Logical/coherent Level of Consciousness: Alert Mood: Depressed Affect: Appropriate to circumstance Anxiety Level: Panic Attacks Panic attack frequency:  (2-3x's per week ) Most recent panic attack:  (02/23/2015) Thought Processes: Coherent Judgement: Impaired Orientation: Person, Place, Time, Situation Obsessive Compulsive Thoughts/Behaviors: None  Cognitive Functioning Concentration: Decreased Memory: Recent Intact, Remote Intact IQ: Average Insight: Poor Impulse Control: Fair Appetite: Poor Weight Loss:  ("20 pounds in the past several months") Weight Gain:  (none reported ) Sleep: Decreased Total Hours of Sleep:  (8 hrs ) Vegetative Symptoms: None  ADLScreening [facility] Assessment Services) Patient's cognitive ability adequate to safely complete daily activities?: Yes Patient able to express need for assistance with ADLs?: No Independently performs ADLs?: Yes (appropriate for developmental age)  Prior Inpatient Therapy Prior Inpatient Therapy: Yes Prior Therapy Dates:  ([facility]-08/26/13, 04/06/14; [facility]-"2 yrs ago";[facility]-"3-4 yrs ago") Prior Therapy Facilty/Provider(s):  ([facility], [facility] [facility], and [facility] ) Reason for Treatment:  (substance abuse, suicidal ideations/attempt/gestures, depres)  Prior Outpatient Therapy Prior Outpatient Therapy: No Prior Therapy Dates:  (n/a) Prior Therapy Facilty/Provider(s):  (n/a) Reason for Treatment:  (n/a)  ADL Screening (condition at time of admission) Patient's cognitive ability adequate to safely complete daily activities?: Yes Is the patient deaf or  have difficulty hearing?: No Does the patient have difficulty seeing, even when wearing glasses/contacts?: No Does the patient have difficulty  concentrating, remembering, or making decisions?: Yes Patient able to express need for assistance with ADLs?: No Does the patient have difficulty dressing or bathing?: No Independently performs ADLs?: Yes (appropriate for developmental age) Does the patient have difficulty walking or climbing stairs?: No Weakness of Legs: None Weakness of Arms/Hands: None  Home Assistive Devices/Equipment Home Assistive Devices/Equipment: None    Abuse/Neglect Assessment (Assessment to be complete while patient is alone) Physical Abuse: Denies Verbal Abuse: Denies Sexual Abuse: Denies Exploitation of patient/patient's resources: Denies Self-Neglect: Denies Values / Beliefs Cultural Requests During Hospitalization: None Spiritual Requests During Hospitalization: None   Advance Directives (For Healthcare) Does patient have an advance directive?: No Would patient like information on creating an advanced directive?: No - patient declined information    Additional Information 1:1 In Past 12 Months?: No CIRT Risk: No Elopement Risk: No Does patient have medical clearance?: Yes     Disposition:  Disposition Initial Assessment Completed for this Encounter: Yes Disposition of Patient: Other dispositions Other disposition(s): Other (Comment), Referred to outside facility ([facility] and [facility])  On Site Evaluation by:   Reviewed with Physician:    Waldon Merl [facility] 02/24/2015 11:24 AM

## 2015-02-24 NOTE — ED Provider Notes (Signed)
CSN: [id number]     Arrival date & time 02/24/15  0700 History   First MD Initiated Contact with Patient 02/24/15 [phone]     Chief Complaint  Patient presents with  . detox      (Consider location/radiation/quality/duration/timing/severity/associated sxs/prior Treatment) HPI   54yF with substance abuse. Long standing history of alcohol abuse. She drinks what ever she can get. Will pick up discarded beer cans and drink what is left in them. [facility]. [facility] she knew too many people there that are a bad influence on her. [facility] but told needed detox. Feels depressed, but no SI. Has abused drugs previously but denies any usage recently. No HI. No hallucinations.   Past Medical History  Diagnosis Date  . Diabetes mellitus without complication   . Hypertension   . Asthma   . Arthritis   . Bipolar 1 disorder, depressed    Past Surgical History  Procedure Laterality Date  . C section     No family history on file. History  Substance Use Topics  . Smoking status: Current Some Day Smoker    Types: Cigarettes  . Smokeless tobacco: Not on file  . Alcohol Use: 9.0 oz/week    15 Cans of beer per week     Comment: "2 weeks of biege drinking"    OB History    No data available     Review of Systems  All systems reviewed and negative, other than as noted in HPI.   Allergies  Aspirin  Home Medications   Prior to Admission medications   Medication Sig Start Date End Date Taking? Authorizing Provider  benztropine (COGENTIN) 1 MG tablet Take 1 tablet (1 mg total) by mouth at bedtime. 04/13/14  Yes Lloyd HugerNeil T Mashburn, PA-C  citalopram (CELEXA) 40 MG tablet Take 1 tablet (40 mg total) by mouth daily. 04/13/14  Yes Lloyd HugerNeil T Mashburn, PA-C  haloperidol (HALDOL) 5 MG tablet Take 1 tablet (5 mg total) by mouth at bedtime. 04/13/14  Yes Lloyd HugerNeil T Mashburn, PA-C  insulin aspart (NOVOLOG) 100 UNIT/ML injection Inject 4-15 Units into the  skin daily. Diabetes management 08/29/13  Yes Sanjuana KavaAgnes I Nwoko, NP  insulin glargine (LANTUS) 100 UNIT/ML injection Inject 0.5 mLs (50 Units total) into the skin at bedtime. 04/13/14  Yes Lloyd HugerNeil T Mashburn, PA-C  lisinopril (PRINIVIL,ZESTRIL) 10 MG tablet Take 1 tablet (10 mg total) by mouth daily. For high blood pressure management 08/29/13  Yes Sanjuana KavaAgnes I Nwoko, NP  metFORMIN (GLUCOPHAGE) 500 MG tablet Take 2 tablets (1,000 mg total) by mouth 2 (two) times daily with a meal. 04/13/14  Yes Tamala JulianNeil T Mashburn, PA-C  omeprazole (PRILOSEC) 20 MG capsule Take 1 capsule (20 mg total) by mouth daily. 04/13/14  Yes Lloyd HugerNeil T Mashburn, PA-C  simvastatin (ZOCOR) 40 MG tablet Take 1 tablet (40 mg total) by mouth every evening. For high Cholesterol control 08/29/13  Yes Sanjuana KavaAgnes I Nwoko, NP  traMADol (ULTRAM) 50 MG tablet Take 1 tablet by mouth every 4 (four) hours as needed. pain 01/25/15  Yes Historical Provider, MD  traZODone (DESYREL) 100 MG tablet Take 1 tablet (100 mg total) by mouth at bedtime. 04/13/14  Yes Lloyd HugerNeil T Mashburn, PA-C   BP 147/89 mmHg  Pulse 69  Temp(Src) 98.4 F (36.9 C) (Oral)  Resp 16  SpO2 100%  LMP 06/30/2013 Physical Exam  Constitutional: She is oriented to person, place, and time. She appears well-developed and well-nourished. No  distress.  HENT:  Head: Normocephalic and atraumatic.  Eyes: Conjunctivae are normal. Right eye exhibits no discharge. Left eye exhibits no discharge.  Neck: Neck supple.  Cardiovascular: Normal rate, regular rhythm and normal heart sounds.  Exam reveals no gallop and no friction rub.   No murmur heard. Pulmonary/Chest: Effort normal and breath sounds normal. No respiratory distress.  Abdominal: Soft. She exhibits no distension. There is no tenderness.  Musculoskeletal: She exhibits no edema or tenderness.  Neurological: She is alert and oriented to person, place, and time. No cranial nerve deficit. She exhibits normal muscle tone. Coordination normal.  Skin: Skin is warm  and dry.  Psychiatric: She has a normal mood and affect. Her behavior is normal. Thought content normal.  Calm. Cooperative. Does not appear to be responding to internal stimuli.   Nursing note and vitals reviewed.   ED Course  Procedures (including critical care time) Labs Review Labs Reviewed  CBC WITH DIFFERENTIAL/PLATELET - Abnormal; Notable for the following:    RBC 3.66 (*)    Hemoglobin 9.4 (*)    HCT 30.0 (*)    MCH 25.7 (*)    RDW 15.6 (*)    Platelets 442 (*)    All other components within normal limits  COMPREHENSIVE METABOLIC PANEL - Abnormal; Notable for the following:    Glucose, Bld 213 (*)    Total Bilirubin 0.1 (*)    All other components within normal limits  CBG MONITORING, ED - Abnormal; Notable for the following:    Glucose-Capillary 149 (*)    All other components within normal limits  ETHANOL  URINE RAPID DRUG SCREEN (HOSP PERFORMED)    Imaging Review No results found.   EKG Interpretation None      MDM   Final diagnoses:  Alcohol abuse    54yF with alcohol abuse. [facility]TS eval.     Raeford Razor, MD 02/26/15 [phone]

## 2015-02-24 NOTE — ED Notes (Signed)
Patient drowsy. Denies SI, HI, AVH. Denies feeling anxious. Reports depression 3/10.   Encouragement offered. Patient oriented to unit, reminded of fall precautions.  Q 15 safety checks continue.

## 2015-02-24 NOTE — Progress Notes (Addendum)
CSW referred patient to the following hospitals with potential bed open: [facility] - Fax referal. Referrals received, per Misty StanleyLisa. [facility] - have beds open. Referral received, per Cici. [facility] - per  Dorisann FramesJ Blackley, referral receveid.  CSW faxed [facility] referral forms (Regional Referral Form 3 pages, Clinical Screening Information Form, and Screening & Admissions form) along with current meds and labs, to RJ PenceBlackley at [facility]. Referral received.  Per Claris CheMargaret at [facility],  patient's authorization number is [id number], starts today, 02/24/2015 through 03/02/2015.  Diana Santos, LCSWA Disposition staff 02/24/2015 9:42 PM

## 2015-02-25 DIAGNOSIS — F1020 Alcohol dependence, uncomplicated: Secondary | ICD-10-CM | POA: Diagnosis present

## 2015-02-25 LAB — CBG MONITORING, ED
Glucose-Capillary: 163 mg/dL — ABNORMAL HIGH (ref 70–99)
Glucose-Capillary: 123 mg/dL — ABNORMAL HIGH (ref 70–99)
Glucose-Capillary: 109 mg/dL — ABNORMAL HIGH (ref 70–99)
Glucose-Capillary: 136 mg/dL — ABNORMAL HIGH (ref 70–99)

## 2015-02-25 NOTE — Consult Note (Signed)
[facility] Consult   Reason for Consult: Alcohol use disorder,  Referring Physician:  EDP Patient Identification: Diana Santos MRN:  [id number] Principal Diagnosis: Alcohol use disorder, severe, dependence Diagnosis:   Patient Active Problem List   Diagnosis Date Noted  . Alcohol use disorder, severe, dependence [F10.20] 02/25/2015    Priority: High  . Diabetes [E11.9] 04/08/2014  . Major depression [F32.2] 04/07/2014  . Alcohol dependence [F10.20] 08/26/2013  . Cocaine dependence [F14.20] 08/26/2013  . Severe major depression with psychotic features [F32.3] 08/26/2013    Total Time spent with patient: 1 hour  Subjective:   Diana Santos is a 54 y.o. female patient admitted with Alcohol use disorder, Alcohol use mood disorder,  HPI:  AA female, 54 years old was sent here from [facility] seeking Alcohol detox.  Patient reports drinking Alcohol for a long time and has had several detox treatments at [facility], [facility], [place] and [place].  Patient also reports a diagnosis of depression and stated that she has not seen her Psychiatrist or taken medications for almost a year.  Patient reports drinking 3-4 40 OZ beers daily and stated that her environment is not suitable for her to remain sober. Patient plans to move down to [facility] after her rehabilitation treatment to prevent relapse.  Patient denies SI/HI/AVH.  Patient has been accepted by Dr Berneice Gandy at [facility] and will be transferred in am.   HPI Elements:   Location:  Alcohol use disorder, Alcohol induced mood disorder, Depression. Quality:  severe. Severity:  severe. Timing:  Acute. Duration:  Chronic mental illness and Alcholism. Context:  Seeking detox treatment.  Past Medical History:  Past Medical History  Diagnosis Date  . Diabetes mellitus without complication   . Hypertension   . Asthma   . Arthritis   . Bipolar 1 disorder, depressed     Past Surgical History  Procedure Laterality Date  . C section      Family History: No family history on file. Social History:  History  Alcohol Use  . 9.0 oz/week  . 15 Cans of beer per week    Comment: "2 weeks of biege drinking"      History  Drug Use  . Yes  . Special: Cocaine    Comment: cocaine last night    History   Social History  . Marital Status: Single    Spouse Name: N/A  . Number of Children: N/A  . Years of Education: N/A   Social History Main Topics  . Smoking status: Current Some Day Smoker    Types: Cigarettes  . Smokeless tobacco: Not on file  . Alcohol Use: 9.0 oz/week    15 Cans of beer per week     Comment: "2 weeks of biege drinking"   . Drug Use: Yes    Special: Cocaine     Comment: cocaine last night  . Sexual Activity: Not Currently   Other Topics Concern  . None   Social History Narrative   Additional Social History:    Pain Medications: SEE MAR Prescriptions: SEE MAR Over the Counter: SEE MAR History of alcohol / drug use?: Yes Longest period of sobriety (when/how long): "few months" Name of Substance 1: Alcohol  1 - Age of First Use: 54 yrs old  1 - Amount (size/oz): (4-5) 40 oz beers 1 - Frequency: daily  1 - Duration: 2 months 1 - Last Use / Amount: 02/23/2015; 40 oz beer and (2) 12 oz beers Name of Substance 2: Cocaine  2 - Age of First Use: 54 yrs old  2 - Amount (size/oz): "A little it...not much" 2 - Frequency: 2-3x's per week  2 - Duration: on-going since age 76 2 - Last Use / Amount: 02/23/2015; "I just used a little bit"                 Allergies:   Allergies  Allergen Reactions  . Aspirin Anaphylaxis and Swelling    Labs:  Results for orders placed or performed during the hospital encounter of 02/24/15 (from the past 48 hour(s))  CBG monitoring, ED     Status: Abnormal   Collection Time: 02/24/15  7:22 AM  Result Value Ref Range   Glucose-Capillary 149 (H) 70 - 99 mg/dL  CBC WITH DIFFERENTIAL     Status: Abnormal   Collection Time: 02/24/15  8:06 AM  Result Value  Ref Range   WBC 7.5 4.0 - 10.5 K/uL   RBC 3.66 (L) 3.87 - 5.11 MIL/uL   Hemoglobin 9.4 (L) 12.0 - 15.0 g/dL   HCT 30.0 (L) 36.0 - 46.0 %   MCV 82.0 78.0 - 100.0 fL   MCH 25.7 (L) 26.0 - 34.0 pg   MCHC 31.3 30.0 - 36.0 g/dL   RDW 15.6 (H) 11.5 - 15.5 %   Platelets 442 (H) 150 - 400 K/uL   Neutrophils Relative % 56 43 - 77 %   Neutro Abs 4.2 1.7 - 7.7 K/uL   Lymphocytes Relative 36 12 - 46 %   Lymphs Abs 2.7 0.7 - 4.0 K/uL   Monocytes Relative 6 3 - 12 %   Monocytes Absolute 0.4 0.1 - 1.0 K/uL   Eosinophils Relative 2 0 - 5 %   Eosinophils Absolute 0.1 0.0 - 0.7 K/uL   Basophils Relative 0 0 - 1 %   Basophils Absolute 0.0 0.0 - 0.1 K/uL  Comprehensive metabolic panel     Status: Abnormal   Collection Time: 02/24/15  8:06 AM  Result Value Ref Range   Sodium 135 135 - 145 mmol/L   Potassium 3.9 3.5 - 5.1 mmol/L   Chloride 101 96 - 112 mmol/L   CO2 27 19 - 32 mmol/L   Glucose, Bld 213 (H) 70 - 99 mg/dL   BUN 9 6 - 23 mg/dL   Creatinine, Ser 0.69 0.50 - 1.10 mg/dL   Calcium 8.6 8.4 - 10.5 mg/dL   Total Protein 7.8 6.0 - 8.3 g/dL   Albumin 3.8 3.5 - 5.2 g/dL   AST 20 0 - 37 U/L   ALT 18 0 - 35 U/L   Alkaline Phosphatase 87 39 - 117 U/L   Total Bilirubin 0.1 (L) 0.3 - 1.2 mg/dL   GFR calc non Af Amer >90 >90 mL/min   GFR calc Af Amer >90 >90 mL/min    Comment: (NOTE) The eGFR has been calculated using the CKD EPI equation. This calculation has not been validated in all clinical situations. eGFR's persistently <90 mL/min signify possible Chronic Kidney Disease.    Anion gap 7 5 - 15  Ethanol     Status: None   Collection Time: 02/24/15  8:06 AM  Result Value Ref Range   Alcohol, Ethyl (B) <5 0 - 9 mg/dL    Comment:        LOWEST DETECTABLE LIMIT FOR SERUM ALCOHOL IS 11 mg/dL FOR MEDICAL PURPOSES ONLY   CBG monitoring, ED     Status: Abnormal   Collection Time: 02/24/15 12:20 PM  Result  Value Ref Range   Glucose-Capillary 158 (H) 70 - 99 mg/dL  Drug screen panel,  emergency     Status: Abnormal   Collection Time: 02/24/15  3:40 PM  Result Value Ref Range   Opiates NONE DETECTED NONE DETECTED   Cocaine POSITIVE (A) NONE DETECTED   Benzodiazepines POSITIVE (A) NONE DETECTED   Amphetamines NONE DETECTED NONE DETECTED   Tetrahydrocannabinol NONE DETECTED NONE DETECTED   Barbiturates NONE DETECTED NONE DETECTED    Comment:        DRUG SCREEN FOR MEDICAL PURPOSES ONLY.  IF CONFIRMATION IS NEEDED FOR ANY PURPOSE, NOTIFY LAB WITHIN 5 DAYS.        LOWEST DETECTABLE LIMITS FOR URINE DRUG SCREEN Drug Class       Cutoff (ng/mL) Amphetamine      1000 Barbiturate      200 Benzodiazepine   517 Tricyclics       001 Opiates          300 Cocaine          300 THC              50   CBG monitoring, ED     Status: Abnormal   Collection Time: 02/24/15  5:43 PM  Result Value Ref Range   Glucose-Capillary 139 (H) 70 - 99 mg/dL   Comment 1 Document in Chart   CBG monitoring, ED     Status: Abnormal   Collection Time: 02/24/15  9:29 PM  Result Value Ref Range   Glucose-Capillary 158 (H) 70 - 99 mg/dL  CBG monitoring, ED     Status: Abnormal   Collection Time: 02/25/15  7:49 AM  Result Value Ref Range   Glucose-Capillary 163 (H) 70 - 99 mg/dL    Vitals: Blood pressure 129/86, pulse 68, temperature 98.4 F (36.9 C), temperature source Oral, resp. rate 16, last menstrual period 06/30/2013, SpO2 100 %.  Risk to Self: Suicidal Ideation: No Suicidal Intent: No Is patient at risk for suicide?: No Suicidal Plan?: No Access to Means: No What has been your use of drugs/alcohol within the last 12 months?:  (alcohol and cocaine ) How many times?:  (1x- "Yrs ago I tried to cut my wrist") Other Self Harm Risks:  (non reported ) Triggers for Past Attempts: Other (Comment) ("I can't remember") Intentional Self Injurious Behavior: None Risk to Others: Homicidal Ideation: No Thoughts of Harm to Others: No Current Homicidal Intent: No Current Homicidal Plan:  No Access to Homicidal Means: No Identified Victim:  (n/a) History of harm to others?: No Assessment of Violence: None Noted Violent Behavior Description:  (patient is calm and cooperative) Does patient have access to weapons?: No Criminal Charges Pending?: No Does patient have a court date: No Prior Inpatient Therapy: Prior Inpatient Therapy: Yes Prior Therapy Dates:  ([facility]-08/26/13, 04/06/14; [facility]-"2 yrs ago";[facility]-"3-4 yrs ago") Prior Therapy Facilty/Provider(s):  ([facility], [facility], and [facility] ) Reason for Treatment:  (substance abuse, suicidal ideations/attempt/gestures, depres) Prior Outpatient Therapy: Prior Outpatient Therapy: No Prior Therapy Dates:  (n/a) Prior Therapy Facilty/Provider(s):  (n/a) Reason for Treatment:  (n/a)  Current Facility-Administered Medications  Medication Dose Route Frequency Provider Last Rate Last Dose  . benztropine (COGENTIN) tablet 1 mg  1 mg Oral QHS Virgel Manifold, MD   1 mg at 02/24/15 2147  . citalopram (CELEXA) tablet 40 mg  40 mg Oral Daily Virgel Manifold, MD   40 mg at 02/25/15 0944  . haloperidol (HALDOL) tablet 5 mg  5 mg Oral QHS  Raeford Razor, MD   5 mg at 02/24/15 2147  . insulin aspart (novoLOG) injection 0-15 Units  0-15 Units Subcutaneous TID WC Raeford Razor, MD   3 Units at 02/25/15 0800  . lisinopril (PRINIVIL,ZESTRIL) tablet 10 mg  10 mg Oral Daily Raeford Razor, MD   10 mg at 02/25/15 0944  . metFORMIN (GLUCOPHAGE) tablet 1,000 mg  1,000 mg Oral BID WC Raeford Razor, MD   1,000 mg at 02/25/15 0800  . pantoprazole (PROTONIX) EC tablet 40 mg  40 mg Oral Daily Raeford Razor, MD   40 mg at 02/25/15 0944  . simvastatin (ZOCOR) tablet 40 mg  40 mg Oral QPM Raeford Razor, MD   40 mg at 02/24/15 1759  . thiamine (VITAMIN B-1) tablet 100 mg  100 mg Oral Daily Raeford Razor, MD   100 mg at 02/25/15 6558   Or  . thiamine (B-1) injection 100 mg  100 mg Intravenous Daily Raeford Razor, MD      . traZODone (DESYREL) tablet 100 mg  100 mg Oral QHS  Raeford Razor, MD   100 mg at 02/24/15 2147   Current Outpatient Prescriptions  Medication Sig Dispense Refill  . benztropine (COGENTIN) 1 MG tablet Take 1 tablet (1 mg total) by mouth at bedtime. 30 tablet 0  . citalopram (CELEXA) 40 MG tablet Take 1 tablet (40 mg total) by mouth daily. 30 tablet 0  . haloperidol (HALDOL) 5 MG tablet Take 1 tablet (5 mg total) by mouth at bedtime. 30 tablet 0  . insulin aspart (NOVOLOG) 100 UNIT/ML injection Inject 4-15 Units into the skin daily. Diabetes management 1 vial 12  . insulin glargine (LANTUS) 100 UNIT/ML injection Inject 0.5 mLs (50 Units total) into the skin at bedtime. 10 mL 11  . lisinopril (PRINIVIL,ZESTRIL) 10 MG tablet Take 1 tablet (10 mg total) by mouth daily. For high blood pressure management 30 tablet 0  . metFORMIN (GLUCOPHAGE) 500 MG tablet Take 2 tablets (1,000 mg total) by mouth 2 (two) times daily with a meal.    . omeprazole (PRILOSEC) 20 MG capsule Take 1 capsule (20 mg total) by mouth daily.    . simvastatin (ZOCOR) 40 MG tablet Take 1 tablet (40 mg total) by mouth every evening. For high Cholesterol control 30 tablet   . traMADol (ULTRAM) 50 MG tablet Take 1 tablet by mouth every 4 (four) hours as needed. pain  0  . traZODone (DESYREL) 100 MG tablet Take 1 tablet (100 mg total) by mouth at bedtime. 30 tablet 0    Musculoskeletal: Strength & Muscle Tone: within normal limits Gait & Station: normal Patient leans: N/A  Psychiatric Specialty Exam:     Blood pressure 129/86, pulse 68, temperature 98.4 F (36.9 C), temperature source Oral, resp. rate 16, last menstrual period 06/30/2013, SpO2 100 %.There is no weight on file to calculate BMI.  General Appearance: Casual  Eye Contact::  Good  Speech:  Clear and Coherent and Normal Rate  Volume:  Normal  Mood:  Depressed  Affect:  Congruent and Depressed  Thought Process:  Coherent, Goal Directed and Intact  Orientation:  Full (Time, Place, and Person)  Thought Content:   WDL  Suicidal Thoughts:  No  Homicidal Thoughts:  No  Memory:  Immediate;   Good Recent;   Good Remote;   Good  Judgement:  Good  Insight:  Good  Psychomotor Activity:  Normal  Concentration:  Good  Recall:  NA  Fund of Knowledge:Good  Language: Good  Akathisia:  NA  Handed:  Right  AIMS (if indicated):     Assets:  Desire for Improvement  ADL's:  Intact  Cognition: WNL  Sleep:      Medical Decision Making: Established Problem, Worsening (2)  Treatment Plan Summary: Will transfer to [facility] in am  Plan:  Will discharge to [facility] IN AM. Disposition: see above  Delfin Gant   PMHNP-BC 02/25/2015 11:49 AM

## 2015-02-25 NOTE — Progress Notes (Signed)
[facility] asking for confirmation that pt could accept tx bed tomorrow 02/26/15. CSW spoke with [place] ([facility]) who confirms this. Called [facility] and relayed information.   [facility] states accepting MD is Dr. Alfonso Pattenom Larson, and report [phone] should be called after 8:30am 02/26/15. Requested that efforts be made to have pt transported before 11am. CS[facility].   Ilean SkillMeghan Elli Groesbeck, MSW, LCSWA Clinical Social Work, Disposition Office 02/25/2015

## 2015-02-25 NOTE — [facility] Suicide Risk Assessment (Cosign Needed)
Suicide Risk Assessment  Discharge Assessment   [facility]   Demographic Factors:  Low socioeconomic status, Living alone and Unemployed  Total Time spent with patient: 20 minutes  Musculoskeletal: Strength & Muscle Tone: within normal limits Gait & Station: normal Patient leans: N/A  Psychiatric Specialty Exam:     Blood pressure 129/86, pulse 68, temperature 98.4 F (36.9 C), temperature source Oral, resp. rate 16, last menstrual period 06/30/2013, SpO2 100 %.There is no weight on file to calculate BMI.  General Appearance: Casual  Eye Contact::  Good  Speech:  Clear and Coherent and Normal Rate409  Volume:  Normal  Mood:  Depressed  Affect:  Congruent and Depressed  Thought Process:  Coherent, Goal Directed and Intact  Orientation:  Full (Time, Place, and Person)  Thought Content:  WDL  Suicidal Thoughts:  No  Homicidal Thoughts:  No  Memory:  Immediate;   Good Recent;   Good Remote;   Good  Judgement:  Fair  Insight:  Good  Psychomotor Activity:  Normal  Concentration:  Good  Recall:  NA  Fund of Knowledge:Good  Language: Good  Akathisia:  NA  Handed:  Right  AIMS (if indicated):     Assets:  Desire for Improvement  Sleep:     Cognition: WNL  ADL's:  Intact      Has this patient used any form of tobacco in the last 30 days? (Cigarettes, Smokeless Tobacco, Cigars, and/or Pipes) N/A  Mental Status Per Nursing Assessment::   On Admission:     Current Mental Status by Physician: NA  Loss Factors: NA  Historical Factors: NA  Risk Reduction Factors:   Religious beliefs about death  Continued Clinical Symptoms:  Depression:   Insomnia  Cognitive Features That Contribute To Risk:  Polarized thinking    Suicide Risk:  Minimal: No identifiable suicidal ideation.  Patients presenting with no risk factors but with morbid ruminations; may be classified as minimal risk based on the severity of the depressive  symptoms  Principal Problem: Alcohol use disorder, severe, dependence Discharge Diagnoses:  Patient Active Problem List   Diagnosis Date Noted  . Alcohol use disorder, severe, dependence [F10.20] 02/25/2015    Priority: High  . Diabetes [E11.9] 04/08/2014  . Major depression [F32.2] 04/07/2014  . Alcohol dependence [F10.20] 08/26/2013  . Cocaine dependence [F14.20] 08/26/2013  . 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

## 2015-02-25 NOTE — ED Notes (Signed)
Acuity low. 

## 2015-02-25 NOTE — Progress Notes (Signed)
[facility] States she received referral for last night (02/24/15) and requested that pt's recent assessment and MD notes be faxed as well. CSW faxed requested documents.   Ilean SkillMeghan Durk Carmen, MSW, LCSWA Clinical Social Work, Disposition Office 02/25/2015

## 2015-02-25 NOTE — ED Notes (Addendum)
Patient resting comfortably. Denies SI, HI, AVH. Patient reports that her anxiety has decreased. Continues to have feelings of depression. Happy to be accepted to [facility].  Encouragement offered.  Q 15 safety checks continue.

## 2015-02-25 NOTE — BH Assessment (Signed)
[facility] Sts that patient will be accepted to their facility for admission tomorrow. [facility] will call back with acceptance details.

## 2015-02-25 NOTE — Discharge Instructions (Signed)
To help you maintain a sober lifestyle, a substance abuse treatment program may be beneficial to you.  You have [facility] an interest in [facility]  Contact them to see about being admitted to their facility:       [facility]      [address]      [place], [place] [address]      [phone]  Another option to consider is [facility].  They offer outpatient mental health and substance abuse treatment:       [facility]      [address]      [place], [place] [address]       [phone]  To locate other treatment providers in this area that the state or county may fund, contact the [facility].  The number below also serves as a 24 hour crisis number for [place] residents:       [facility]      [phone]

## 2015-02-26 LAB — CBG MONITORING, ED: Glucose-Capillary: 162 mg/dL — ABNORMAL HIGH (ref 70–99)

## 2015-02-26 NOTE — Progress Notes (Signed)
Pt to be transferred to [facility] by [facility] tranpsortation. RN calling report to [phone]. No further Clinical Social Work needs, signing off.    Olga CoasterKristen Rashell Shambaugh, LCSW  Clinical Social Work  [facility] [phone]

## 2015-05-19 ENCOUNTER — Encounter (HOSPITAL_COMMUNITY): Payer: Self-pay

## 2015-05-19 ENCOUNTER — Emergency Department (EMERGENCY_DEPARTMENT_HOSPITAL)
Admission: EM | Admit: 2015-05-19 | Discharge: 2015-05-20 | Disposition: A | Discharge status: Other Acute Inpatient Hospital | Payer: No Typology Code available for payment source | Source: Home / Self Care | Attending: Emergency Medicine | Admitting: Emergency Medicine

## 2015-05-19 DIAGNOSIS — F1020 Alcohol dependence, uncomplicated: Secondary | ICD-10-CM | POA: Diagnosis present

## 2015-05-19 DIAGNOSIS — R45851 Suicidal ideations: Secondary | ICD-10-CM

## 2015-05-19 DIAGNOSIS — R739 Hyperglycemia, unspecified: Secondary | ICD-10-CM

## 2015-05-19 DIAGNOSIS — F1910 Other psychoactive substance abuse, uncomplicated: Secondary | ICD-10-CM

## 2015-05-19 HISTORY — DX: Other psychoactive substance abuse, uncomplicated: F19.10

## 2015-05-19 LAB — BASIC METABOLIC PANEL
Anion gap: 10 (ref 5–15)
BUN: 12 mg/dL (ref 6–20)
CALCIUM: 9.1 mg/dL (ref 8.9–10.3)
CHLORIDE: 100 mmol/L — AB (ref 101–111)
CO2: 24 mmol/L (ref 22–32)
Creatinine, Ser: 0.82 mg/dL (ref 0.44–1.00)
Glucose, Bld: 457 mg/dL — ABNORMAL HIGH (ref 65–99)
Potassium: 4.8 mmol/L (ref 3.5–5.1)
Sodium: 134 mmol/L — ABNORMAL LOW (ref 135–145)

## 2015-05-19 LAB — URINE MICROSCOPIC-ADD ON

## 2015-05-19 LAB — CBC
HCT: 34.3 % — ABNORMAL LOW (ref 36.0–46.0)
Hemoglobin: 11.1 g/dL — ABNORMAL LOW (ref 12.0–15.0)
MCH: 25.7 pg — ABNORMAL LOW (ref 26.0–34.0)
MCHC: 32.4 g/dL (ref 30.0–36.0)
MCV: 79.4 fL (ref 78.0–100.0)
PLATELETS: 337 10*3/uL (ref 150–400)
RBC: 4.32 MIL/uL (ref 3.87–5.11)
RDW: 18.6 % — ABNORMAL HIGH (ref 11.5–15.5)
WBC: 9.5 10*3/uL (ref 4.0–10.5)

## 2015-05-19 LAB — RAPID URINE DRUG SCREEN, HOSP PERFORMED
Amphetamines: NOT DETECTED
Barbiturates: NOT DETECTED
Benzodiazepines: NOT DETECTED
COCAINE: POSITIVE — AB
Opiates: NOT DETECTED
Tetrahydrocannabinol: NOT DETECTED

## 2015-05-19 LAB — URINALYSIS, ROUTINE W REFLEX MICROSCOPIC
Bilirubin Urine: NEGATIVE
Glucose, UA: >1000 mg/dL — AB
Hgb urine dipstick: NEGATIVE
KETONES UR: NEGATIVE mg/dL
LEUKOCYTES UA: NEGATIVE
NITRITE: NEGATIVE
PH: 5.5 (ref 5.0–8.0)
Protein, ur: NEGATIVE mg/dL
Specific Gravity, Urine: 1.029 (ref 1.005–1.030)
Urobilinogen, UA: 0.2 mg/dL (ref 0.0–1.0)

## 2015-05-19 LAB — CBG MONITORING, ED
GLUCOSE-CAPILLARY: 383 mg/dL — AB (ref 65–99)
GLUCOSE-CAPILLARY: 141 mg/dL — AB (ref 65–99)
Glucose-Capillary: 376 mg/dL — ABNORMAL HIGH (ref 65–99)
Glucose-Capillary: 218 mg/dL — ABNORMAL HIGH (ref 65–99)
Glucose-Capillary: 234 mg/dL — ABNORMAL HIGH (ref 65–99)
Glucose-Capillary: 200 mg/dL — ABNORMAL HIGH (ref 65–99)

## 2015-05-19 LAB — ETHANOL: Alcohol, Ethyl (B): <5 mg/dL (ref <5)

## 2015-05-19 MED ORDER — TRAZODONE HCL 100 MG PO TABS
100.0000 mg | ORAL_TABLET | Freq: Every day | ORAL | Status: DC
  Administered 2015-05-19: 100 mg via ORAL
  Filled 2015-05-19: qty 1

## 2015-05-19 MED ORDER — LORAZEPAM 1 MG PO TABS
1.0000 mg | ORAL_TABLET | Freq: Three times a day (TID) | ORAL | Status: DC | PRN
Start: 2015-05-19 — End: 2015-05-20

## 2015-05-19 MED ORDER — SIMVASTATIN 40 MG PO TABS
40.0000 mg | ORAL_TABLET | Freq: Every evening | ORAL | Status: DC
  Administered 2015-05-19: 40 mg via ORAL
  Filled 2015-05-19 (×2): qty 1

## 2015-05-19 MED ORDER — LISINOPRIL 10 MG PO TABS
10.0000 mg | ORAL_TABLET | Freq: Every day | ORAL | Status: DC
  Administered 2015-05-19 – 2015-05-20 (×2): 10 mg via ORAL
  Filled 2015-05-19 (×2): qty 1

## 2015-05-19 MED ORDER — INSULIN ASPART 100 UNIT/ML ~~LOC~~ SOLN
0.0000 [IU] | Freq: Three times a day (TID) | SUBCUTANEOUS | Status: DC
  Administered 2015-05-19: 5 [IU] via SUBCUTANEOUS
  Administered 2015-05-20: 2 [IU] via SUBCUTANEOUS
  Filled 2015-05-19: qty 1

## 2015-05-19 MED ORDER — LORAZEPAM 1 MG PO TABS: 0.0000 mg | ORAL_TABLET | Freq: Two times a day (BID) | ORAL | Status: DC

## 2015-05-19 MED ORDER — INSULIN ASPART 100 UNIT/ML ~~LOC~~ SOLN: 4.0000 [IU] | Freq: Every day | SUBCUTANEOUS | Status: DC

## 2015-05-19 MED ORDER — CITALOPRAM HYDROBROMIDE 40 MG PO TABS
40.0000 mg | ORAL_TABLET | Freq: Every day | ORAL | Status: DC
  Administered 2015-05-19 – 2015-05-20 (×2): 40 mg via ORAL
  Filled 2015-05-19 (×2): qty 1

## 2015-05-19 MED ORDER — ACETAMINOPHEN 325 MG PO TABS: 650.0000 mg | ORAL_TABLET | ORAL | Status: DC | PRN

## 2015-05-19 MED ORDER — ALUM & MAG HYDROXIDE-SIMETH 200-200-20 MG/5ML PO SUSP: 30.0000 mL | ORAL | Status: DC | PRN

## 2015-05-19 MED ORDER — SODIUM CHLORIDE 0.9 % IV SOLN
INTRAVENOUS | Status: DC
  Administered 2015-05-19: 3.2 [IU]/h via INTRAVENOUS
  Filled 2015-05-19: qty 2.5

## 2015-05-19 MED ORDER — INSULIN GLARGINE 100 UNIT/ML ~~LOC~~ SOLN
25.0000 [IU] | Freq: Every day | SUBCUTANEOUS | Status: DC
  Administered 2015-05-19: 25 [IU] via SUBCUTANEOUS
  Filled 2015-05-19 (×2): qty 0.25

## 2015-05-19 MED ORDER — LORAZEPAM 1 MG PO TABS
0.0000 mg | ORAL_TABLET | Freq: Four times a day (QID) | ORAL | Status: DC
  Administered 2015-05-19: 1 mg via ORAL
  Filled 2015-05-19: qty 1

## 2015-05-19 MED ORDER — BENZTROPINE MESYLATE 1 MG PO TABS
1.0000 mg | ORAL_TABLET | Freq: Every day | ORAL | Status: DC
  Administered 2015-05-19: 1 mg via ORAL
  Filled 2015-05-19: qty 1

## 2015-05-19 MED ORDER — PANTOPRAZOLE SODIUM 40 MG PO TBEC
40.0000 mg | DELAYED_RELEASE_TABLET | Freq: Every day | ORAL | Status: DC
  Administered 2015-05-20: 40 mg via ORAL
  Filled 2015-05-19: qty 1

## 2015-05-19 MED ORDER — ONDANSETRON HCL 4 MG PO TABS: 4.0000 mg | ORAL_TABLET | Freq: Three times a day (TID) | ORAL | Status: DC | PRN

## 2015-05-19 MED ORDER — ZOLPIDEM TARTRATE 5 MG PO TABS: 5.0000 mg | ORAL_TABLET | Freq: Every evening | ORAL | Status: DC | PRN

## 2015-05-19 MED ORDER — HALOPERIDOL 5 MG PO TABS
5.0000 mg | ORAL_TABLET | Freq: Every day | ORAL | Status: DC
  Administered 2015-05-19: 5 mg via ORAL
  Filled 2015-05-19: qty 1

## 2015-05-19 MED ORDER — METFORMIN HCL 500 MG PO TABS
1000.0000 mg | ORAL_TABLET | Freq: Two times a day (BID) | ORAL | Status: DC
  Administered 2015-05-19 – 2015-05-20 (×2): 1000 mg via ORAL
  Filled 2015-05-19 (×4): qty 2

## 2015-05-19 MED ORDER — SODIUM CHLORIDE 0.9 % IV BOLUS (SEPSIS)
1000.0000 mL | Freq: Once | INTRAVENOUS | Status: AC
  Administered 2015-05-19: 1000 mL via INTRAVENOUS
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
## 2015-05-19 NOTE — ED Notes (Signed)
Patient requesting detox from cocaine and alcohol. Patient states she last drank beer, wine, and liquor all night long. Patient states she last used crack 2 nights ago. Patient lethargic, but answers questions appropriately. Patient denies SI/HI, but states she hears voices that tell her to jump off of a bridge.

## 2015-05-19 NOTE — ED Notes (Signed)
She is in no distress.  Blanket given per her request.

## 2015-05-19 NOTE — ED Provider Notes (Signed)
CSN: [id number]     Arrival date & time 05/19/15  1043 History   First MD Initiated Contact with Patient 05/19/15 1122     Chief Complaint  Patient presents with  . detox    . Hyperglycemia     (Consider location/radiation/quality/duration/timing/severity/associated sxs/prior Treatment) HPI  Pt presenting with request for detox.  She states that she has contacted a place in [place] for long term rehab but they state she needs inpatient detox first.  She uses alcohol and cocaine.  She also states she hears voices that tell her to kill herself.  She states she is using drugs and drinking and has not been taking her diabetes meds.  No fever/chills, no vomiting, no cough or difficulty breathing.  There are no other associated systemic symptoms, there are no other alleviating or modifying factors.   Past Medical History  Diagnosis Date  . Diabetes mellitus without complication   . Hypertension   . Asthma   . Arthritis   . Bipolar 1 disorder, depressed   . Substance abuse    Past Surgical History  Procedure Laterality Date  . C section     History reviewed. No pertinent family history. History  Substance Use Topics  . Smoking status: Current Some Day Smoker    Types: Cigarettes  . Smokeless tobacco: Never Used  . Alcohol Use: Yes   OB History    No data available     Review of Systems  ROS reviewed and all otherwise negative except for mentioned in HPI    Allergies  Aspirin  Home Medications   Prior to Admission medications   Medication Sig Start Date End Date Taking? Authorizing Provider  benztropine (COGENTIN) 1 MG tablet Take 1 tablet (1 mg total) by mouth at bedtime. 04/13/14   Tamala Julian, PA-C  citalopram (CELEXA) 40 MG tablet Take 1 tablet (40 mg total) by mouth daily. 04/13/14   Tamala Julian, PA-C  haloperidol (HALDOL) 5 MG tablet Take 1 tablet (5 mg total) by mouth at bedtime. 04/13/14   Tamala Julian, PA-C  insulin aspart (NOVOLOG) 100 UNIT/ML  injection Inject 4-15 Units into the skin daily. Diabetes management 08/29/13   Sanjuana Kava, NP  insulin glargine (LANTUS) 100 UNIT/ML injection Inject 0.5 mLs (50 Units total) into the skin at bedtime. 04/13/14   Tamala Julian, PA-C  lisinopril (PRINIVIL,ZESTRIL) 10 MG tablet Take 1 tablet (10 mg total) by mouth daily. For high blood pressure management 08/29/13   Sanjuana Kava, NP  metFORMIN (GLUCOPHAGE) 500 MG tablet Take 2 tablets (1,000 mg total) by mouth 2 (two) times daily with a meal. 04/13/14   Tamala Julian, PA-C  omeprazole (PRILOSEC) 20 MG capsule Take 1 capsule (20 mg total) by mouth daily. 04/13/14   Tamala Julian, PA-C  simvastatin (ZOCOR) 40 MG tablet Take 1 tablet (40 mg total) by mouth every evening. For high Cholesterol control 08/29/13   Sanjuana Kava, NP  traMADol (ULTRAM) 50 MG tablet Take 1 tablet by mouth every 4 (four) hours as needed. pain 01/25/15   Historical Provider, MD  traZODone (DESYREL) 100 MG tablet Take 1 tablet (100 mg total) by mouth at bedtime. 04/13/14   Tamala Julian, PA-C   BP 161/88 mmHg  Pulse 79  Temp(Src) 98 F (36.7 C) (Oral)  Resp 18  SpO2 99%  LMP 06/30/2013  Vitals reviewed Physical Exam  Physical Examination: General appearance - alert, well appearing, and in no distress  Mental status - alert, oriented to person, place, and time Eyes - no conjunctival injection, no scleral icterus Mouth - mucous membranes moist, pharynx normal without lesions Chest - clear to auscultation, no wheezes, rales or rhonchi, symmetric air entry Heart - normal rate, regular rhythm, normal S1, S2, no murmurs, rubs, clicks or gallops Neurological - alert, oriented, normal speech, no focal findings or movement disorder noted Extremities - peripheral pulses normal, no pedal edema, no clubbing or cyanosis Skin - normal coloration and turgor, no rashes Psych- calm and cooperative  ED Course  Procedures (including critical care time) Labs Review Labs Reviewed   CBC - Abnormal; Notable for the following:    Hemoglobin 11.1 (*)    HCT 34.3 (*)    MCH 25.7 (*)    RDW 18.6 (*)    All other components within normal limits  BASIC METABOLIC PANEL - Abnormal; Notable for the following:    Sodium 134 (*)    Chloride 100 (*)    Glucose, Bld 457 (*)    All other components within normal limits  CBG MONITORING, ED - Abnormal; Notable for the following:    Glucose-Capillary 376 (*)    All other components within normal limits  CBG MONITORING, ED - Abnormal; Notable for the following:    Glucose-Capillary 383 (*)    All other components within normal limits  CBG MONITORING, ED - Abnormal; Notable for the following:    Glucose-Capillary 218 (*)    All other components within normal limits  CBG MONITORING, ED - Abnormal; Notable for the following:    Glucose-Capillary 141 (*)    All other components within normal limits  ETHANOL  URINE RAPID DRUG SCREEN, HOSP PERFORMED  URINALYSIS, ROUTINE W REFLEX MICROSCOPIC (NOT AT [facility])    Imaging Review No results found.   EKG Interpretation None      MDM   Final diagnoses:  Substance abuse  Suicidal ideation  Hyperglycemia    Pt presenting with request for detox, also suicidal thoughts.  Her blood glucose is elevated, not in DKA.    2:43 PM blood sugar is now down to 218, no DKA.  Psych holding orders written with sliding scale insulin.  Awaiting TTS consult.   Jerelyn Scott, MD 05/19/15 [phone]

## 2015-05-19 NOTE — ED Notes (Signed)
Per pt: "I know my blood sugar was high and I want to go to a long term facility for rehab for the drugs". Pt presents with flat affect, tearful with depressed mood on initial contact. Pt endorsed SI without plan. Verbally contracted for safety. Denied HI, AVH and pain when assessed. Ambulates with a steady gait. Encouraged to voice concerns. Supper and fluids given. Verbal education done on ordered medications prior to administration. Pt resting in bed at present respirations noted. Support and availability offered. Safety maintained on Q 15 minutes checks.

## 2015-05-19 NOTE — ED Notes (Signed)
Pt. Remains in no distress; and her insulin drip is ceased per order of Dr. Karma Ganja.

## 2015-05-19 NOTE — ED Notes (Signed)
She remains in no distress and she is dressed in paper scrubs and wanded by security; thence escorted to [facility] rm. 34.  I have given report to [place], Charity fundraiser.

## 2015-05-19 NOTE — ED Notes (Signed)
Pt sleeping on stretcher, no distress noted, resp. Even and unlabored.  Skin color good.  Arouseable to loud verbal stimuli. Monitoring for safety, Q 15 min checks in effect.

## 2015-05-19 NOTE — ED Notes (Signed)
Bed: [place] Expected date:  Expected time:  Means of arrival:  Comments: rm 12

## 2015-05-19 NOTE — ED Notes (Signed)
Pt knows we need a urine sample. States she will go "in a little while."

## 2015-05-20 ENCOUNTER — Inpatient Hospital Stay (HOSPITAL_COMMUNITY)
Admission: AD | Admit: 2015-05-20 | Discharge: 2015-05-25 | DRG: 897 | Disposition: A | Discharge status: Home / Self Care | Payer: No Typology Code available for payment source | Source: Intra-hospital | Attending: Psychiatry | Admitting: Psychiatry

## 2015-05-20 ENCOUNTER — Encounter (HOSPITAL_COMMUNITY): Payer: Self-pay

## 2015-05-20 DIAGNOSIS — E119 Type 2 diabetes mellitus without complications: Secondary | ICD-10-CM | POA: Diagnosis present

## 2015-05-20 DIAGNOSIS — F17210 Nicotine dependence, cigarettes, uncomplicated: Secondary | ICD-10-CM | POA: Diagnosis present

## 2015-05-20 DIAGNOSIS — R45851 Suicidal ideations: Secondary | ICD-10-CM | POA: Diagnosis present

## 2015-05-20 DIAGNOSIS — F333 Major depressive disorder, recurrent, severe with psychotic symptoms: Secondary | ICD-10-CM | POA: Insufficient documentation

## 2015-05-20 DIAGNOSIS — M1990 Unspecified osteoarthritis, unspecified site: Secondary | ICD-10-CM | POA: Diagnosis present

## 2015-05-20 DIAGNOSIS — J45909 Unspecified asthma, uncomplicated: Secondary | ICD-10-CM | POA: Diagnosis present

## 2015-05-20 DIAGNOSIS — I10 Essential (primary) hypertension: Secondary | ICD-10-CM | POA: Diagnosis present

## 2015-05-20 DIAGNOSIS — F1020 Alcohol dependence, uncomplicated: Secondary | ICD-10-CM

## 2015-05-20 DIAGNOSIS — K047 Periapical abscess without sinus: Secondary | ICD-10-CM | POA: Diagnosis present

## 2015-05-20 DIAGNOSIS — F1910 Other psychoactive substance abuse, uncomplicated: Secondary | ICD-10-CM | POA: Insufficient documentation

## 2015-05-20 DIAGNOSIS — F1424 Cocaine dependence with cocaine-induced mood disorder: Secondary | ICD-10-CM | POA: Diagnosis present

## 2015-05-20 DIAGNOSIS — F1420 Cocaine dependence, uncomplicated: Secondary | ICD-10-CM | POA: Diagnosis present

## 2015-05-20 DIAGNOSIS — F323 Major depressive disorder, single episode, severe with psychotic features: Secondary | ICD-10-CM | POA: Diagnosis present

## 2015-05-20 LAB — CBG MONITORING, ED
Glucose-Capillary: 69 mg/dL (ref 65–99)
Glucose-Capillary: 130 mg/dL — ABNORMAL HIGH (ref 65–99)

## 2015-05-20 LAB — GLUCOSE, CAPILLARY: Glucose-Capillary: 313 mg/dL — ABNORMAL HIGH (ref 65–99)

## 2015-05-20 MED ORDER — ACETAMINOPHEN 325 MG PO TABS
650.0000 mg | ORAL_TABLET | ORAL | Status: DC | PRN
  Administered 2015-05-21 – 2015-05-22 (×2): 650 mg via ORAL
  Filled 2015-05-20 (×2): qty 2

## 2015-05-20 MED ORDER — ONDANSETRON HCL 4 MG PO TABS: 4.0000 mg | ORAL_TABLET | Freq: Three times a day (TID) | ORAL | Status: DC | PRN

## 2015-05-20 MED ORDER — LORAZEPAM 1 MG PO TABS: 1.0000 mg | ORAL_TABLET | Freq: Three times a day (TID) | ORAL | Status: DC | PRN

## 2015-05-20 MED ORDER — CITALOPRAM HYDROBROMIDE 40 MG PO TABS
40.0000 mg | ORAL_TABLET | Freq: Every day | ORAL | Status: DC
  Administered 2015-05-21 – 2015-05-25 (×5): 40 mg via ORAL
  Filled 2015-05-20: qty 1
  Filled 2015-05-20: qty 14
  Filled 2015-05-20 (×5): qty 1

## 2015-05-20 MED ORDER — PANTOPRAZOLE SODIUM 40 MG PO TBEC
40.0000 mg | DELAYED_RELEASE_TABLET | Freq: Every day | ORAL | Status: DC
  Administered 2015-05-21 – 2015-05-25 (×5): 40 mg via ORAL
  Filled 2015-05-20 (×3): qty 1
  Filled 2015-05-20: qty 14
  Filled 2015-05-20 (×3): qty 1

## 2015-05-20 MED ORDER — ENSURE ENLIVE PO LIQD
237.0000 mL | Freq: Two times a day (BID) | ORAL | Status: DC
  Administered 2015-05-21: 237 mL via ORAL

## 2015-05-20 MED ORDER — SIMVASTATIN 40 MG PO TABS
40.0000 mg | ORAL_TABLET | Freq: Every evening | ORAL | Status: DC
  Administered 2015-05-20 – 2015-05-24 (×5): 40 mg via ORAL
  Filled 2015-05-20: qty 1
  Filled 2015-05-20: qty 2
  Filled 2015-05-20: qty 14
  Filled 2015-05-20 (×5): qty 1

## 2015-05-20 MED ORDER — INSULIN ASPART 100 UNIT/ML ~~LOC~~ SOLN
0.0000 [IU] | Freq: Three times a day (TID) | SUBCUTANEOUS | Status: DC
  Administered 2015-05-21: 3 [IU] via SUBCUTANEOUS
  Administered 2015-05-21: 2 [IU] via SUBCUTANEOUS
  Administered 2015-05-21: 3 [IU] via SUBCUTANEOUS
  Administered 2015-05-22: 2 [IU] via SUBCUTANEOUS
  Administered 2015-05-22: 3 [IU] via SUBCUTANEOUS
  Administered 2015-05-23: 11 [IU] via SUBCUTANEOUS
  Administered 2015-05-23: 3 [IU] via SUBCUTANEOUS
  Administered 2015-05-23: 15 [IU] via SUBCUTANEOUS
  Administered 2015-05-24: 3 [IU] via SUBCUTANEOUS
  Administered 2015-05-24: 2 [IU] via SUBCUTANEOUS
  Administered 2015-05-24 – 2015-05-25 (×2): 3 [IU] via SUBCUTANEOUS

## 2015-05-20 MED ORDER — ALUM & MAG HYDROXIDE-SIMETH 200-200-20 MG/5ML PO SUSP
30.0000 mL | ORAL | Status: DC | PRN
  Administered 2015-05-23 – 2015-05-24 (×2): 30 mL via ORAL
  Filled 2015-05-20 (×2): qty 30

## 2015-05-20 MED ORDER — TRAZODONE HCL 100 MG PO TABS
100.0000 mg | ORAL_TABLET | Freq: Every day | ORAL | Status: DC
  Administered 2015-05-20 – 2015-05-24 (×5): 100 mg via ORAL
  Filled 2015-05-20 (×3): qty 1
  Filled 2015-05-20: qty 14
  Filled 2015-05-20 (×4): qty 1

## 2015-05-20 MED ORDER — LORAZEPAM 1 MG PO TABS
0.0000 mg | ORAL_TABLET | Freq: Four times a day (QID) | ORAL | Status: AC
  Administered 2015-05-20 – 2015-05-21 (×3): 1 mg via ORAL
  Filled 2015-05-20 (×2): qty 1

## 2015-05-20 MED ORDER — LORAZEPAM 1 MG PO TABS
0.0000 mg | ORAL_TABLET | Freq: Two times a day (BID) | ORAL | Status: AC
  Administered 2015-05-21 – 2015-05-23 (×4): 1 mg via ORAL
  Filled 2015-05-20 (×5): qty 1

## 2015-05-20 MED ORDER — METFORMIN HCL 500 MG PO TABS
1000.0000 mg | ORAL_TABLET | Freq: Two times a day (BID) | ORAL | Status: DC
  Administered 2015-05-20 – 2015-05-25 (×10): 1000 mg via ORAL
  Filled 2015-05-20 (×4): qty 2
  Filled 2015-05-20: qty 56
  Filled 2015-05-20 (×2): qty 2
  Filled 2015-05-20: qty 56
  Filled 2015-05-20 (×7): qty 2

## 2015-05-20 MED ORDER — NICOTINE 21 MG/24HR TD PT24
21.0000 mg | MEDICATED_PATCH | Freq: Every day | TRANSDERMAL | Status: DC
Start: 2015-05-21 — End: 2015-05-25
  Administered 2015-05-21 – 2015-05-25 (×5): 21 mg via TRANSDERMAL
  Filled 2015-05-20 (×2): qty 1
  Filled 2015-05-20: qty 14
  Filled 2015-05-20 (×5): qty 1

## 2015-05-20 MED ORDER — BENZTROPINE MESYLATE 1 MG PO TABS
1.0000 mg | ORAL_TABLET | Freq: Every day | ORAL | Status: DC
  Administered 2015-05-20 – 2015-05-24 (×5): 1 mg via ORAL
  Filled 2015-05-20: qty 14
  Filled 2015-05-20 (×7): qty 1

## 2015-05-20 MED ORDER — ACETAMINOPHEN 325 MG PO TABS: 650.0000 mg | ORAL_TABLET | Freq: Four times a day (QID) | ORAL | Status: DC | PRN

## 2015-05-20 MED ORDER — HALOPERIDOL 5 MG PO TABS
5.0000 mg | ORAL_TABLET | Freq: Every day | ORAL | Status: DC
  Administered 2015-05-20 – 2015-05-24 (×5): 5 mg via ORAL
  Filled 2015-05-20 (×3): qty 1
  Filled 2015-05-20: qty 14
  Filled 2015-05-20 (×4): qty 1

## 2015-05-20 MED ORDER — LISINOPRIL 10 MG PO TABS
10.0000 mg | ORAL_TABLET | Freq: Every day | ORAL | Status: DC
  Administered 2015-05-21 – 2015-05-25 (×5): 10 mg via ORAL
  Filled 2015-05-20: qty 14
  Filled 2015-05-20 (×6): qty 1

## 2015-05-20 MED ORDER — INSULIN GLARGINE 100 UNIT/ML ~~LOC~~ SOLN
25.0000 [IU] | Freq: Every day | SUBCUTANEOUS | Status: DC
  Administered 2015-05-20 – 2015-05-24 (×4): 25 [IU] via SUBCUTANEOUS

## 2015-05-20 NOTE — Progress Notes (Signed)
Pt's CBG prior to dinner:  144

## 2015-05-20 NOTE — Tx Team (Addendum)
Initial Interdisciplinary Treatment Plan   PATIENT STRESSORS: Financial difficulties Substance abuse   PATIENT STRENGTHS: Ability for insight Capable of independent living Communication skills General fund of knowledge Motivation for treatment/growth   PROBLEM LIST: Problem List/Patient Goals Date to be addressed Date deferred Reason deferred Estimated date of resolution  Depression 05/21/2015     Polysubstance-ETOH dependent & Crack cocaine use 05/21/2015     Homelessness 05/21/2015     "I need help" 05/21/2015                                    DISCHARGE CRITERIA:  Ability to meet basic life and health needs Improved stabilization in mood, thinking, and/or behavior Motivation to continue treatment in a less acute level of care Need for constant or close observation no longer present Safe-care adequate arrangements made Verbal commitment to aftercare and medication compliance Withdrawal symptoms are absent or subacute and managed without 24-hour nursing intervention  PRELIMINARY DISCHARGE PLAN: Attend aftercare/continuing care group Outpatient therapy Placement in alternative living arrangements  PATIENT/FAMIILY INVOLVEMENT: This treatment plan has been presented to and reviewed with the patient, Diana Santos.  The patient and family have been given the opportunity to ask questions and make suggestions.  Alfonse Spruce 05/20/2015, 6:03 PM
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
## 2015-05-20 NOTE — Progress Notes (Signed)
D: Patient reports feeling tired and wanted to sleep. Pt detoxing from EOTH and c/o tremors, and anxiety. Pt mood/affect appeared depressed and sad. Pt endorses auditory hallucination telling her she is worthless. Pt denies SI/HI/VH and pain.  No acute distressed noted at this time.   A: Medications administered as prescribed. Emotional support given and will continue to monitor pt's progress for stabilization.  R: Patient remains safe and complaint with medications.

## 2015-05-20 NOTE — Progress Notes (Signed)
Patient did not attend the evening karaoke group. Pt remained asleep when notified that group was beginning.

## 2015-05-20 NOTE — BH Assessment (Signed)
[facility] Assessment Progress Note  Per Thedore Mins, MD, this pt requires psychiatric hospitalization at this time.  Berneice Heinrich, RN, [facility] has assigned pt to [facility] Rm 304-2.  Pt has signed Voluntary Admission and Consent for Treatment, as well as Consent to Release Information to [facility], her outpatient provider, and a notification call has been place.  Signed forms have been faxed to [facility].  Pt's nurse, Kendal Hymen, has been notified, and agrees to send original paperwork along with pt via Juel Burrow, and to call report to [phone].  Doylene Canning, MA Triage Specialist [phone]

## 2015-05-20 NOTE — Consult Note (Signed)
[facility] Consult  Alcohol u Reason for Consult: Alcohol  use disorder, severe, Cocaine use disorder, severe, Bipolar disorder by hx Referring Physician:  EDP Patient Identification: Roisin Mones MRN:  [id number] Principal Diagnosis: Alcohol use disorder, severe, dependence Diagnosis:   Patient Active Problem List   Diagnosis Date Noted  . Alcohol use disorder, severe, dependence [F10.20] 02/25/2015    Priority: High  . Diabetes [E11.9] 04/08/2014  . Major depression [F32.2] 04/07/2014  . Alcohol dependence [F10.20] 08/26/2013  . Cocaine dependence [F14.20] 08/26/2013  . 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 female patient admitted with .Alcohol use disorder, severe, Cocaine use disorder  HPI:  AA female 54 years old was evaluated for Alcohol use. Patient reports drinking 4-5 40 oz Beer daily.  She reports using $100 Cocaine daily.  Patient has had detox treatment x 2 at [facility] and [place].  Patient also admitted to diagnosis of Depression and Bipolar disorder.  Patient states that she is tired of using and will like detox treatment then move on to Long term treatment facility.  Patient denies SI/HI/ but reported hearing voices and seeing things that she could not explain.  She also  reported cutting her wrist 5 years ago as suicide gesture.  Patient reports that her deterrent for killing herself are the need to stop drinking and the love for her children. She has been accepted for admission and has a bed assigned at our [facility].   Patient will be moved to her assigned bed as soon as transportation becomes available.   HPI Elements:   Location:  Alcohol use disorder, severe, Cocaine use disorder, severe, Bipolar disorder, by hx. Quality:  severe. Severity:  severe. Timing:  Acute. Duration:  Chronic use. Context:  Came in seeking treatment for Alcoholism..  Past  Medical History:  Past Medical History  Diagnosis Date  . Diabetes mellitus without complication   . Hypertension   . Asthma   . Arthritis   . Bipolar 1 disorder, depressed   . Substance abuse     Past Surgical History  Procedure Laterality Date  . C section     Family History: History reviewed. No pertinent family history. Social History:  History  Alcohol Use  . Yes     History  Drug Use  . Yes  . Special: Cocaine, Marijuana    Comment: cocaine last night    History   Social History  . Marital Status: Single    Spouse Name: N/A  . Number of Children: N/A  . Years of Education: N/A   Social History Main Topics  . Smoking status: Current Some Day Smoker    Types: Cigarettes  . Smokeless tobacco: Never Used  . Alcohol Use: Yes  . Drug Use: Yes    Special: Cocaine, Marijuana     Comment: cocaine last night  . Sexual Activity: Not Currently   Other Topics Concern  . None   Social History Narrative   Additional Social History:    Pain Medications: SEE MAR Prescriptions: SEE MAR Over the Counter: SEE MAR History of alcohol / drug use?: Yes Longest period of sobriety (when/how long): 2-3 days Negative Consequences of Use: Financial, Legal, Personal relationships, Work / School Name of Substance 1: Crack Cocaine  1 - Age of First Use: "52 or 54 yrs old" 1 - Amount (size/oz): $100 per day  1 -  Frequency: daily for several years 1 - Duration: on-going  1 - Last Use / Amount: 05/18/2015 Name of Substance 2: Alcohol  2 - Age of First Use: "54yr old" 2 - Amount (size/oz): (5) 40oz beer and 1/2 pint  2 - Frequency: daily  2 - Duration: on-going  2 - Last Use / Amount: 05/18/2015                 Allergies:   Allergies  Allergen Reactions  . Aspirin Anaphylaxis and Swelling    Labs:  Results for orders placed or performed during the hospital encounter of 05/19/15 (from the past 48 hour(s))  CBG monitoring, ED     Status: Abnormal   Collection Time:  05/19/15 10:57 AM  Result Value Ref Range   Glucose-Capillary 376 (H) 65 - 99 mg/dL   Comment 1 Notify RN    Comment 2 Document in Chart   CBC     Status: Abnormal   Collection Time: 05/19/15 11:46 AM  Result Value Ref Range   WBC 9.5 4.0 - 10.5 K/uL   RBC 4.32 3.87 - 5.11 MIL/uL   Hemoglobin 11.1 (L) 12.0 - 15.0 g/dL   HCT 34.3 (L) 36.0 - 46.0 %   MCV 79.4 78.0 - 100.0 fL   MCH 25.7 (L) 26.0 - 34.0 pg   MCHC 32.4 30.0 - 36.0 g/dL   RDW 18.6 (H) 11.5 - 15.5 %   Platelets 337 150 - 400 K/uL  Basic metabolic panel     Status: Abnormal   Collection Time: 05/19/15 11:46 AM  Result Value Ref Range   Sodium 134 (L) 135 - 145 mmol/L   Potassium 4.8 3.5 - 5.1 mmol/L   Chloride 100 (L) 101 - 111 mmol/L   CO2 24 22 - 32 mmol/L   Glucose, Bld 457 (H) 65 - 99 mg/dL   BUN 12 6 - 20 mg/dL   Creatinine, Ser 0.82 0.44 - 1.00 mg/dL   Calcium 9.1 8.9 - 10.3 mg/dL   GFR calc non Af Amer >60 >60 mL/min   GFR calc Af Amer >60 >60 mL/min    Comment: (NOTE) The eGFR has been calculated using the CKD EPI equation. This calculation has not been validated in all clinical situations. eGFR's persistently <60 mL/min signify possible Chronic Kidney Disease.    Anion gap 10 5 - 15  Ethanol     Status: None   Collection Time: 05/19/15 11:46 AM  Result Value Ref Range   Alcohol, Ethyl (B) <5 <5 mg/dL    Comment:        LOWEST DETECTABLE LIMIT FOR SERUM ALCOHOL IS 5 mg/dL FOR MEDICAL PURPOSES ONLY   CBG monitoring, ED     Status: Abnormal   Collection Time: 05/19/15 12:53 PM  Result Value Ref Range   Glucose-Capillary 383 (H) 65 - 99 mg/dL  CBG monitoring, ED     Status: Abnormal   Collection Time: 05/19/15  2:41 PM  Result Value Ref Range   Glucose-Capillary 218 (H) 65 - 99 mg/dL  CBG monitoring, ED     Status: Abnormal   Collection Time: 05/19/15  3:50 PM  Result Value Ref Range   Glucose-Capillary 141 (H) 65 - 99 mg/dL  Urine rapid drug screen (hosp performed)     Status: Abnormal    Collection Time: 05/19/15  4:13 PM  Result Value Ref Range   Opiates NONE DETECTED NONE DETECTED   Cocaine POSITIVE (A) NONE DETECTED   Benzodiazepines NONE DETECTED  NONE DETECTED   Amphetamines NONE DETECTED NONE DETECTED   Tetrahydrocannabinol NONE DETECTED NONE DETECTED   Barbiturates NONE DETECTED NONE DETECTED    Comment:        DRUG SCREEN FOR MEDICAL PURPOSES ONLY.  IF CONFIRMATION IS NEEDED FOR ANY PURPOSE, NOTIFY LAB WITHIN 5 DAYS.        LOWEST DETECTABLE LIMITS FOR URINE DRUG SCREEN Drug Class       Cutoff (ng/mL) Amphetamine      1000 Barbiturate      200 Benzodiazepine   329 Tricyclics       924 Opiates          300 Cocaine          300 THC              50   Urinalysis, Routine w reflex microscopic (not at [facility])     Status: Abnormal   Collection Time: 05/19/15  4:13 PM  Result Value Ref Range   Color, Urine YELLOW YELLOW   APPearance CLOUDY (A) CLEAR   Specific Gravity, Urine 1.029 1.005 - 1.030   pH 5.5 5.0 - 8.0   Glucose, UA >1000 (A) NEGATIVE mg/dL   Hgb urine dipstick NEGATIVE NEGATIVE   Bilirubin Urine NEGATIVE NEGATIVE   Ketones, ur NEGATIVE NEGATIVE mg/dL   Protein, ur NEGATIVE NEGATIVE mg/dL   Urobilinogen, UA 0.2 0.0 - 1.0 mg/dL   Nitrite NEGATIVE NEGATIVE   Leukocytes, UA NEGATIVE NEGATIVE  Urine microscopic-add on     Status: Abnormal   Collection Time: 05/19/15  4:13 PM  Result Value Ref Range   Squamous Epithelial / LPF FEW (A) RARE   RBC / HPF 0-2 <3 RBC/hpf   Bacteria, UA FEW (A) RARE  CBG monitoring, ED     Status: Abnormal   Collection Time: 05/19/15  5:45 PM  Result Value Ref Range   Glucose-Capillary 234 (H) 65 - 99 mg/dL  CBG monitoring, ED     Status: Abnormal   Collection Time: 05/19/15  7:49 PM  Result Value Ref Range   Glucose-Capillary 200 (H) 65 - 99 mg/dL   Comment 1 Notify RN    Comment 2 Document in Chart   CBG monitoring, ED     Status: None   Collection Time: 05/20/15  7:56 AM  Result Value Ref Range    Glucose-Capillary 69 65 - 99 mg/dL  CBG monitoring, ED     Status: Abnormal   Collection Time: 05/20/15 12:54 PM  Result Value Ref Range   Glucose-Capillary 130 (H) 65 - 99 mg/dL    Vitals: Blood pressure 131/79, pulse 73, temperature 98.8 F (37.1 C), temperature source Oral, resp. rate 18, last menstrual period 06/30/2013, SpO2 100 %.  Risk to Self: Suicidal Ideation: No Suicidal Intent: No Is patient at risk for suicide?: No Suicidal Plan?: No Access to Means: No What has been your use of drugs/alcohol within the last 12 months?:  (n/a) How many times?:  (n/a) Other Self Harm Risks:  (n/a) Triggers for Past Attempts: Other (Comment) (n/a) Intentional Self Injurious Behavior: None Risk to Others: Homicidal Ideation: No Thoughts of Harm to Others: No Current Homicidal Intent: No Current Homicidal Plan: No Access to Homicidal Means: No Identified Victim:  (n/a) History of harm to others?: No Assessment of Violence: None Noted Violent Behavior Description:  (patient is calm and cooperative) Does patient have access to weapons?: No Criminal Charges Pending?: No Does patient have a court date: No Prior Inpatient Therapy: Prior  Inpatient Therapy: Yes Prior Therapy Dates:  ([facility]-admitted to the facility 2x's; pt does not recall dates) Prior Therapy Facilty/Provider(s):  [facility]) Reason for Treatment:  (auditory hallucinations and suicide attempt) Prior Outpatient Therapy: Prior Outpatient Therapy: No Prior Therapy Dates:  (n/a) Prior Therapy Facilty/Provider(s):  (n/a) Reason for Treatment:  (n/a) Does patient have an ACCT team?: No Does patient have Intensive In-House Services?  : No Does patient have [facility] services? : No Does patient have P4[facility] services?: No  Current Facility-Administered Medications  Medication Dose Route Frequency Provider Last Rate Last Dose  . acetaminophen (TYLENOL) tablet 650 mg  650 mg Oral Q4H PRN Alfonzo Beers, MD      . alum & mag hydroxide-simeth  (MAALOX/MYLANTA) 200-200-20 MG/5ML suspension 30 mL  30 mL Oral PRN Alfonzo Beers, MD      . benztropine (COGENTIN) tablet 1 mg  1 mg Oral QHS Alfonzo Beers, MD   1 mg at 05/19/15 2107  . citalopram (CELEXA) tablet 40 mg  40 mg Oral Daily Alfonzo Beers, MD   40 mg at 05/20/15 1048  . haloperidol (HALDOL) tablet 5 mg  5 mg Oral QHS Alfonzo Beers, MD   5 mg at 05/19/15 2107  . insulin aspart (novoLOG) injection 0-15 Units  0-15 Units Subcutaneous TID WC Alfonzo Beers, MD   2 Units at 05/20/15 1301  . insulin glargine (LANTUS) injection 25 Units  25 Units Subcutaneous QHS Alfonzo Beers, MD   25 Units at 05/19/15 2000  . lisinopril (PRINIVIL,ZESTRIL) tablet 10 mg  10 mg Oral Daily Alfonzo Beers, MD   10 mg at 05/20/15 1050  . LORazepam (ATIVAN) tablet 0-4 mg  0-4 mg Oral 4 times per day Alfonzo Beers, MD   1 mg at 05/19/15 1823   Followed by  . [START ON 05/21/2015] LORazepam (ATIVAN) tablet 0-4 mg  0-4 mg Oral Q12H Alfonzo Beers, MD      . LORazepam (ATIVAN) tablet 1 mg  1 mg Oral Q8H PRN Alfonzo Beers, MD      . metFORMIN (GLUCOPHAGE) tablet 1,000 mg  1,000 mg Oral BID WC Alfonzo Beers, MD   1,000 mg at 05/20/15 0951  . ondansetron (ZOFRAN) tablet 4 mg  4 mg Oral Q8H PRN Alfonzo Beers, MD      . pantoprazole (PROTONIX) EC tablet 40 mg  40 mg Oral Daily Alfonzo Beers, MD   40 mg at 05/20/15 1050  . simvastatin (ZOCOR) tablet 40 mg  40 mg Oral QPM Alfonzo Beers, MD   40 mg at 05/19/15 1803  . traZODone (DESYREL) tablet 100 mg  100 mg Oral QHS Alfonzo Beers, MD   100 mg at 05/19/15 2107   Current Outpatient Prescriptions  Medication Sig Dispense Refill  . benzocaine (ORAJEL) 10 % mucosal gel Use as directed 1 application in the mouth or throat as needed for mouth pain.    . benztropine (COGENTIN) 1 MG tablet Take 1 tablet (1 mg total) by mouth at bedtime. 30 tablet 0  . citalopram (CELEXA) 40 MG tablet Take 1 tablet (40 mg total) by mouth daily. 30 tablet 0  . haloperidol (HALDOL) 5 MG tablet Take 1  tablet (5 mg total) by mouth at bedtime. 30 tablet 0  . insulin aspart (NOVOLOG) 100 UNIT/ML injection Inject 4-15 Units into the skin daily. Diabetes management 1 vial 12  . insulin glargine (LANTUS) 100 UNIT/ML injection Inject 0.5 mLs (50 Units total) into the skin at bedtime. (Patient taking differently: Inject 25 Units into the skin  at bedtime. ) 10 mL 11  . lisinopril (PRINIVIL,ZESTRIL) 10 MG tablet Take 1 tablet (10 mg total) by mouth daily. For high blood pressure management 30 tablet 0  . MELATONIN PO Take 2 tablets by mouth at bedtime.    . metFORMIN (GLUCOPHAGE) 500 MG tablet Take 2 tablets (1,000 mg total) by mouth 2 (two) times daily with a meal.    . omeprazole (PRILOSEC) 20 MG capsule Take 1 capsule (20 mg total) by mouth daily.    . simvastatin (ZOCOR) 40 MG tablet Take 1 tablet (40 mg total) by mouth every evening. For high Cholesterol control 30 tablet     Musculoskeletal: Strength & Muscle Tone: within normal limits Gait & Station: normal Patient leans: N/A  Psychiatric Specialty Exam: Physical Exam  Review of Systems  Constitutional: Negative.   HENT: Negative.   Eyes: Negative.   Respiratory: Negative.   Cardiovascular: Negative.   Gastrointestinal: Negative.   Genitourinary: Negative.   Musculoskeletal: Negative.   Skin: Negative.   Neurological: Negative.   Endo/Heme/Allergies: Negative.     Blood pressure 131/79, pulse 73, temperature 98.8 F (37.1 C), temperature source Oral, resp. rate 18, last menstrual period 06/30/2013, SpO2 100 %.There is no weight on file to calculate BMI.  General Appearance: Casual and Disheveled  Eye Contact::  Good  Speech:  Clear and Coherent and Normal Rate  Volume:  Normal  Mood:  Anxious and Depressed  Affect:  Congruent and Depressed  Thought Process:  Coherent, Goal Directed and Intact  Orientation:  Full (Time, Place, and Person)  Thought Content:  Hallucinations: Auditory Visual  Suicidal Thoughts:  No  Homicidal  Thoughts:  No  Memory:  Immediate;   Fair Recent;   Fair Remote;   Fair  Judgement:  Fair  Insight:  Shallow  Psychomotor Activity:  Normal  Concentration:  Good  Recall:  NA  Fund of Knowledge:Fair  Language: Good  Akathisia:  NA  Handed:  Right  AIMS (if indicated):     Assets:  Desire for Improvement  ADL's:  Intact  Cognition: WNL  Sleep:      Medical Decision Making: Review of Psycho-Social Stressors (1), Established Problem, Worsening (2), Independent Review of image, tracing or specimen (2) and Review of Medication Regimen & Side Effects (2)  Treatment Plan Summary: Accepted at [facility]:  Recommend psychiatric Inpatient admission when medically cleared.  We are using our [facility] detox protocol for her detox. Disposition:   Delfin Gant    PMHNP-BC 05/20/2015 1:41 PM Patient seen face-to-face for psychiatric evaluation, chart reviewed and case discussed with the physician extender and developed treatment plan. Reviewed the information documented and agree with the treatment plan. Corena Pilgrim, MD

## 2015-05-21 ENCOUNTER — Encounter (HOSPITAL_COMMUNITY): Payer: Self-pay | Admitting: Psychiatry

## 2015-05-21 LAB — GLUCOSE, CAPILLARY
GLUCOSE-CAPILLARY: 183 mg/dL — AB (ref 65–99)
Glucose-Capillary: 144 mg/dL — ABNORMAL HIGH (ref 65–99)
Glucose-Capillary: 123 mg/dL — ABNORMAL HIGH (ref 65–99)
Glucose-Capillary: 229 mg/dL — ABNORMAL HIGH (ref 65–99)
Glucose-Capillary: 247 mg/dL — ABNORMAL HIGH (ref 65–99)

## 2015-05-21 MED ORDER — BENZOCAINE 10 % MT GEL
Freq: Three times a day (TID) | OROMUCOSAL | Status: DC | PRN
  Administered 2015-05-22: 07:00:00 via OROMUCOSAL
  Filled 2015-05-21: qty 9.4

## 2015-05-21 MED ORDER — AMOXICILLIN 500 MG PO CAPS
1000.0000 mg | ORAL_CAPSULE | ORAL | Status: AC
  Administered 2015-05-21: 1000 mg via ORAL
  Filled 2015-05-21: qty 2
  Filled 2015-05-21: qty 4

## 2015-05-21 MED ORDER — GLUCERNA SHAKE PO LIQD
237.0000 mL | Freq: Two times a day (BID) | ORAL | Status: DC
  Administered 2015-05-22 – 2015-05-25 (×5): 237 mL via ORAL

## 2015-05-21 MED ORDER — AMOXICILLIN 500 MG PO CAPS
500.0000 mg | ORAL_CAPSULE | Freq: Three times a day (TID) | ORAL | Status: DC
  Administered 2015-05-22 – 2015-05-25 (×11): 500 mg via ORAL
  Filled 2015-05-21: qty 1
  Filled 2015-05-21: qty 12
  Filled 2015-05-21 (×4): qty 1
  Filled 2015-05-21: qty 2
  Filled 2015-05-21: qty 12
  Filled 2015-05-21 (×6): qty 1
  Filled 2015-05-21: qty 12
  Filled 2015-05-21 (×2): qty 1

## 2015-05-21 NOTE — Clinical Social Work Note (Signed)
CSW contacted Tamika at [facility] in [place]- no beds available until end of July/early August.  Samuella Bruin, MSW, [facility] Clinical Social Worker [facility] [phone]
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
## 2015-05-21 NOTE — [facility] Group Notes (Signed)
[facility] LCSW Aftercare Discharge Planning Group Note  05/21/2015  8:45 AM  Participation Quality: Did Not Attend. Patient invited to participate but declined.  Javone Ybanez, MSW, LCSWA Clinical Social Worker [facility] [phone]    

## 2015-05-21 NOTE — Progress Notes (Signed)
NUTRITION ASSESSMENT  Pt identified as at risk on the Malnutrition Screen Tool  INTERVENTION: 1. Educated patient on the importance of nutrition and encouraged intake of food and beverages. 2. Discussed weight goals. 3. Supplements: Ensure Enlive po BID, each supplement provides 350 kcal and 20 grams of protein ordered  NUTRITION DIAGNOSIS: Unintentional weight loss related to sub-optimal intake as evidenced by pt report.   Goal: Pt to meet >/= 90% of their estimated nutrition needs.  Monitor:  PO intake  Assessment:  Pt seen for MST with unknown weight loss per report. Per weight hx review, pt has lost 5 lbs (4% body weight) in 1.5 months which is not significant for time frame.  Ensure Enlive BID order in place. Pt with SI, auditory hallucinations and cocaine abuse PTA. Expect her to meet needs during admission.  54 y.o. female  Height: Ht Readings from Last 1 Encounters:  05/20/15 5\' 3"  (1.6 m)    Weight: Wt Readings from Last 1 Encounters:  05/20/15 127 lb (57.607 kg)    Weight Hx: Wt Readings from Last 10 Encounters:  05/20/15 127 lb (57.607 kg)  04/07/14 132 lb (59.875 kg)  08/26/13 140 lb (63.504 kg)  08/25/13 135 lb (61.236 kg)  01/13/13 140 lb (63.504 kg)    BMI:  Body mass index is 22.5 kg/(m^2). Pt meets criteria for normal weight status based on current BMI.  Estimated Nutritional Needs: Kcal: 25-30 kcal/kg Protein: > 1 gram protein/kg Fluid: 1 ml/kcal  Diet Order: Diet Carb Modified Fluid consistency:: Thin; Room service appropriate?: Yes Pt is also offered choice of unit snacks mid-morning and mid-afternoon.  Pt is eating as desired.   Lab results and medications reviewed.   Trenton Gammon, RD, LDN Inpatient Clinical Dietitian Pager # [phone] After hours/weekend pager # [phone]

## 2015-05-21 NOTE — Progress Notes (Signed)
Pt was invited to group, however she was in bed sleeping.

## 2015-05-21 NOTE — Tx Team (Signed)
Interdisciplinary Treatment Plan Update (Adult) Date: 05/21/2015   Time Reviewed: 9:30 AM  Progress in Treatment: Attending groups: Continuing to assess, patient new to milieu. Participating in groups: Continuing to assess, patient new to milieu. Taking medication as prescribed: Yes Tolerating medication: Yes Family/Significant other contact made: No, CSW assessing for appropriate contacts Patient understands diagnosis: Yes Discussing patient identified problems/goals with staff: Yes Medical problems stabilized or resolved: Yes Denies suicidal/homicidal ideation: Yes Issues/concerns per patient self-inventory: Yes Other:  New problem(s) identified: N/A  Discharge Plan or Barriers:   6/24: CSW continuing to assess, patient new to milieu  Reason for Continuation of Hospitalization:  Depression Anxiety Medication Stabilization   Comments: N/A  Estimated length of stay: 3-5 days  For review of initial/current patient goals, please see plan of care. Patient is a 54 year old female admitted for depression, history of bipolar disorder, and detox from alcohol and crack cocaine. Patient will benefit from crisis stabilization, medication evaluation, group therapy, and psycho education in addition to case management for discharge planning. Patient and CSW reviewed pt's identified goals and treatment plan. Pt verbalized understanding and agreed to treatment plan.   Attendees: Patient:    Family:    Physician: Dr. Jama Flavors; Dr. Dub Mikes 05/21/2015 9:30 AM  Nursing: , RN 05/21/2015 9:30 AM  Clinical Social Worker: Samuella Bruin,  LCSWA 05/21/2015 9:30 AM  Other: Juline Patch, LCSW 05/21/2015 9:30 AM  Other: Leisa Lenz, Vesta Mixer Liaison 05/21/2015 9:30 AM  Other: Onnie Boer, Case Manager 05/21/2015 9:30 AM  Other: Serena Colonel, NP 05/21/2015 9:30 AM  Other:    Other:    Other:    Other:    Other:      Scribe for Treatment Team:  Samuella Bruin, MSW, [employer] [phone]

## 2015-05-21 NOTE — Plan of Care (Signed)
Problem: Alteration in mood & ability to function due to Goal: STG-Patient will comply with prescribed medication regimen (Patient will comply with prescribed medication regimen)  Outcome: Progressing Pt is safe and compliant with medication regime.

## 2015-05-21 NOTE — Progress Notes (Signed)
Recreation Therapy Notes  Date: 06.24.16 Time: 9:30 am Location: [address] Group Room  Group Topic: Stress Management  Goal Area(s) Addresses:  Patient will verbalize importance of using healthy stress management.  Patient will identify positive emotions associated with healthy stress management.   Intervention: Stress Management  Activity :  Progressive Muscle Relaxation.  LRT introduced and educated patients on the stress management technique of progressive muscle relaxation.  A script was used to deliver the technique to patients.  Patients were asked to follow the script read aloud by LRT to engage in practicing the stress management techniques.  Education:  Stress Management, Discharge Planning.   Education Outcome: Acknowledges edcuation/In group clarification offered/Needs additional education  Clinical Observations/Feedback: Patient did not attend group.   Chaunice Obie, LRT/CTRS         Shalimar Mcclain A 05/21/2015 1:18 PM 

## 2015-05-21 NOTE — [facility] Counselor (Signed)
CSW attempted to complete PSA with patient. Patient in bed and would not respond to CSW introduction or questioning. Patient continuing to detox. CSW unable to complete assessment at this time.  Samuella Bruin, MSW, [employer] Clinical Social Worker [facility] [phone]

## 2015-05-21 NOTE — [facility] Suicide Risk Assessment (Signed)
[facility] Admission Suicide Risk Assessment   Nursing information obtained from:  Patient Demographic factors:  Unemployed, Low socioeconomic status Current Mental Status:  NA Loss Factors:  Financial problems / change in socioeconomic status Historical Factors:  Prior suicide attempts Risk Reduction Factors:  Positive social support Total Time spent with patient: 45 minutes Principal Problem: Alcohol use disorder, severe, dependence Diagnosis:   Patient Active Problem List   Diagnosis Date Noted  . Substance abuse [F19.10]   . Alcohol use disorder, severe, dependence [F10.20] 02/25/2015  . Diabetes [E11.9] 04/08/2014  . Major depression [F32.2] 04/07/2014  . Alcohol dependence [F10.20] 08/26/2013  . Cocaine dependence [F14.20] 08/26/2013  . 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  World Science writer [facility]). Score between 0-7:  no or low risk or alcohol related problems. Score between 8-15:  moderate risk of alcohol related problems. Score between 16-19:  high risk of alcohol related problems. Score 20 or above:  warrants further diagnostic evaluation for alcohol dependence and treatment.   CLINICAL FACTORS:   Depression:   Comorbid alcohol abuse/dependence Severe Alcohol/Substance Abuse/Dependencies   Musculoskeletal: Psychiatric Specialty Exam: Physical Exam  ROS  Blood pressure 131/83, pulse 74, temperature 98.5 F (36.9 C), temperature source Oral, resp. rate 14, height 5\' 3"  (1.6 m), weight 57.607 kg (127 lb), last menstrual period 06/30/2013, SpO2 99 %.Body mass index is 22.5 kg/(m^2).    COGNITIVE FEATURES THAT CONTRIBUTE TO RISK:  Closed-mindedness, Polarized thinking and Thought constriction (tunnel vision)    SUICIDE RISK:   Moderate:  Frequent suicidal ideation with limited intensity, and duration, some specificity in terms of plans, no associated intent, good self-control, limited dysphoria/symptomatology, some risk factors present, and identifiable protective factors, including available and accessible social support.  PLAN OF CARE: Supportive approach/copign skills                               Detox as needed                               Reassess and address the co morbidities                               Explore residential treatment options  Medical Decision Making:  Review of Psycho-Social Stressors (1), Review or order clinical lab tests (1), Review of Medication Regimen & Side Effects (2) and Review of New Medication or Change in Dosage (2)  I certify that inpatient services furnished can reasonably be expected to improve the patient's condition.   Juniper Snyders A 05/21/2015, 6:32 PM

## 2015-05-21 NOTE — [facility] Group Notes (Signed)
[facility] LCSW Group Therapy 05/21/2015  1:15 PM   Type of Therapy: Group Therapy  Participation Level: Did Not Attend. Patient invited to participate but declined.   Samuella Bruin, MSW, [employer] Clinical Social Worker [facility] [phone]

## 2015-05-21 NOTE — Progress Notes (Signed)
Patient ID: Diana Santos, female   DOB: May 13, 1961, 54 y.o.   MRN: [id number]     Patient had complaints of tooth pain.  Lower right side the first premolar (bicuspid) right with cavity and  Gum below tooth red, swollen and  Pustule  Will start antibiotic treatment for dental abscess:   Amoxicillin 1000 mg loading dose followed by 500 mg TID for 7 days.    Shuvon B. Rankin FNP-BC   I agree with assessment and plan Madie Reno A. Dub Mikes, M.D.

## 2015-05-21 NOTE — Progress Notes (Signed)
Pt has been in bed most of the day, refusing to go to groups, only gets out of bed for meals and medications, med compliant, pleasant during interaction--depressed mood, denies SI/HI/VH, endorses AH--voices "telling me I'm worthless", pt c/o anxiety and shakiness most of the day, on Ativan Protocol, Emotional support provided, Encouraged pt to continue with treatment plan and attend all group activities, q15 min checks maintained for safety.

## 2015-05-22 DIAGNOSIS — F1020 Alcohol dependence, uncomplicated: Principal | ICD-10-CM

## 2015-05-22 LAB — GLUCOSE, CAPILLARY
GLUCOSE-CAPILLARY: 172 mg/dL — AB (ref 65–99)
Glucose-Capillary: 59 mg/dL — ABNORMAL LOW (ref 65–99)
Glucose-Capillary: 90 mg/dL (ref 65–99)
Glucose-Capillary: 149 mg/dL — ABNORMAL HIGH (ref 65–99)
Glucose-Capillary: 251 mg/dL — ABNORMAL HIGH (ref 65–99)

## 2015-05-22 NOTE — Progress Notes (Signed)
D: Pt denies SI/HI/AVH. Pt is pleasant and cooperative. Pt stayed in bed the whole evening. Pt woke up to take her medication.   A: Pt was offered support and encouragement. Pt was given scheduled medications. Pt was encourage to attend groups. Q 15 minute checks were done for safety.   R:Pt attends groups and interacts well with peers and staff. Pt is taking medication. Pt has no complaints at this time .Pt receptive to treatment and safety maintained on unit.

## 2015-05-22 NOTE — Progress Notes (Signed)
Hypoglycemic Event  CBG: 59  Treatment: 15 GM carbohydrate snack  Symptoms: Shaky  Follow-up CBG: Time:630 CBG Result: 90  Possible Reasons for Event: Other: pt probably did not eat last night  Comments/MD notified:continue to monitor    Jacques Navy A  Remember to initiate Hypoglycemia Order Set & complete

## 2015-05-22 NOTE — Plan of Care (Signed)
Problem: Alteration in mood & ability to function due to Goal: STG-Patient will comply with prescribed medication regimen (Patient will comply with prescribed medication regimen)  Outcome: Progressing Pt adheres to medication routine

## 2015-05-22 NOTE — [facility] Counselor (Signed)
Attempt to complete PSA with patient at 9:30 am and 11:15 AM were unsuccessful as patient would not open eyes or respond to introduction nor questions. Will attempt later today. Carney Bern, LCSW
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
## 2015-05-22 NOTE — Progress Notes (Signed)
[facility] MD Progress Note  05/22/2015 1:54 PM Diana Santos  MRN:  [id number] Subjective:  I'm feeling ok.  Ive just been drinking a lot just before I came.  I drink hard beer."   Objective:  54 Y/o female who states she came wanting help with her depression and her substance abuse. She states she has been drinking ( 4) 40 ounces a day for month and a half. Also uses Cocaine 3-4 times a week.  She is somnolent and she is in her bed.  She is denying withdrawal symptoms.  Principal Problem: Alcohol use disorder, severe, dependence Diagnosis:   Patient Active Problem List   Diagnosis Date Noted  . Substance abuse [F19.10]   . Alcohol use disorder, severe, dependence [F10.20] 02/25/2015  . Diabetes [E11.9] 04/08/2014  . Major depression [F32.2] 04/07/2014  . Alcohol dependence [F10.20] 08/26/2013  . Cocaine dependence [F14.20] 08/26/2013  . 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 Diabetes mellitus without complication   . Hypertension   . Asthma   . Arthritis   . Bipolar 1 disorder, depressed   . Substance abuse     Past Surgical History  Procedure Laterality Date  . C section     Family History: History reviewed. No pertinent family history. Social History:  History  Alcohol Use  . Yes    Comment: three to four 40oz beers daily     History  Drug Use  . Yes  . Special: Cocaine, Marijuana    Comment: crack cocaine daily    History   Social History  . Marital Status: Single    Spouse Name: N/A  . Number of Children: N/A  . Years of Education: N/A   Social History Main Topics  . Smoking status: Current Some Day Smoker -- 1.00 packs/day    Types: Cigarettes  . Smokeless tobacco: Never Used  . Alcohol Use: Yes     Comment: three to four 40oz beers daily  . Drug Use: Yes    Special: Cocaine, Marijuana     Comment: crack cocaine  daily  . Sexual Activity: Not Currently   Other Topics Concern  . None   Social History Narrative   Additional History:    Sleep: Fair  Appetite:  Fair   Assessment:   Musculoskeletal: Strength & Muscle Tone: within normal limits Gait & Station: normal Patient leans: N/A   Psychiatric Specialty Exam: Physical Exam  Vitals reviewed. Psychiatric: Her mood appears anxious. She is slowed. She exhibits a depressed mood.    Review of Systems  Constitutional: Negative for fever.  Eyes: Negative for blurred vision.  Respiratory: Negative for cough.   Cardiovascular: Negative for chest pain.  Gastrointestinal: Negative for heartburn.  Genitourinary: Negative for dysuria.  Musculoskeletal: Negative for myalgias.  Neurological: Negative for dizziness and headaches.  Endo/Heme/Allergies: Does not bruise/bleed easily.  Psychiatric/Behavioral: Positive for substance abuse. The patient is nervous/anxious.     Blood pressure 122/72, pulse 72, temperature 98.4 F (36.9 C), temperature source Oral, resp. rate 16, height 5\' 3"  (1.6 m), weight 57.607 kg (127 lb), last menstrual period 06/30/2013, SpO2 99 %.Body mass index is 22.5 kg/(m^2).   General Appearance: Casual and Disheveled  Eye Contact:: Good  Speech: Clear and Coherent and Normal Rate  Volume: Normal  Mood: Anxious and Depressed  Affect: Congruent and Depressed  Thought Process: Coherent, Goal  Directed and Intact  Orientation: Full (Time, Place, and Person)  Thought Content: Hallucinations: Auditory Visual  Suicidal Thoughts: No  Homicidal Thoughts: No  Memory: Immediate; Fair Recent; Fair Remote; Fair  Judgement: Fair  Insight: Shallow  Psychomotor Activity: Normal  Concentration: Good  Recall: NA  Fund of Knowledge:Fair  Language: Good  Akathisia: NA  Handed: Right  AIMS (if indicated):    Assets: Desire for Improvement  ADL's: Intact  Cognition: WNL   Sleep:  6.75 hrs        Current Medications: Current Facility-Administered Medications  Medication Dose Route Frequency Provider Last Rate Last Dose  . acetaminophen (TYLENOL) tablet 650 mg  650 mg Oral Q4H PRN Earney Navy, NP   650 mg at 05/22/15 0641  . alum & mag hydroxide-simeth (MAALOX/MYLANTA) 200-200-20 MG/5ML suspension 30 mL  30 mL Oral PRN Earney Navy, NP      . amoxicillin (AMOXIL) capsule 500 mg  500 mg Oral TID Shuvon B Rankin, NP   500 mg at 05/22/15 1233  . benzocaine (ORAJEL) 10 % mucosal gel   Mouth/Throat TID PRN Shuvon B Rankin, NP      . benztropine (COGENTIN) tablet 1 mg  1 mg Oral QHS Earney Navy, NP   1 mg at 05/21/15 2225  . citalopram (CELEXA) tablet 40 mg  40 mg Oral Daily Earney Navy, NP   40 mg at 05/22/15 0857  . feeding supplement (GLUCERNA SHAKE) (GLUCERNA SHAKE) liquid 237 mL  237 mL Oral BID BM Rachael Fee, MD   237 mL at 05/22/15 1000  . haloperidol (HALDOL) tablet 5 mg  5 mg Oral QHS Earney Navy, NP   5 mg at 05/21/15 2225  . insulin aspart (novoLOG) injection 0-15 Units  0-15 Units Subcutaneous TID WC Earney Navy, NP   2 Units at 05/22/15 1234  . insulin glargine (LANTUS) injection 25 Units  25 Units Subcutaneous QHS Earney Navy, NP   25 Units at 05/21/15 2223  . lisinopril (PRINIVIL,ZESTRIL) tablet 10 mg  10 mg Oral Daily Earney Navy, NP   10 mg at 05/22/15 0901  . LORazepam (ATIVAN) tablet 0-4 mg  0-4 mg Oral Q12H Earney Navy, NP   1 mg at 05/22/15 0856  . LORazepam (ATIVAN) tablet 1 mg  1 mg Oral Q8H PRN Earney Navy, NP      . metFORMIN (GLUCOPHAGE) tablet 1,000 mg  1,000 mg Oral BID WC Earney Navy, NP   1,000 mg at 05/22/15 0856  . nicotine (NICODERM CQ - dosed in mg/24 hours) patch 21 mg  21 mg Transdermal Q0600 Rachael Fee, MD   21 mg at 05/22/15 0600  . ondansetron (ZOFRAN) tablet 4 mg  4 mg Oral Q8H PRN Earney Navy, NP      . pantoprazole (PROTONIX) EC tablet  40 mg  40 mg Oral Daily Earney Navy, NP   40 mg at 05/22/15 0856  . simvastatin (ZOCOR) tablet 40 mg  40 mg Oral QPM Earney Navy, NP   40 mg at 05/21/15 1717  . traZODone (DESYREL) tablet 100 mg  100 mg Oral QHS Earney Navy, NP   100 mg at 05/21/15 2225    Lab Results:  Results for orders placed or performed during the hospital encounter of 05/20/15 (from the past 48 hour(s))  Glucose, capillary     Status: Abnormal   Collection Time: 05/20/15  5:10 PM  Result Value  Ref Range   Glucose-Capillary 144 (H) 65 - 99 mg/dL   Comment 1 Notify RN    Comment 2 Document in Chart   Glucose, capillary     Status: Abnormal   Collection Time: 05/20/15 10:12 PM  Result Value Ref Range   Glucose-Capillary 313 (H) 65 - 99 mg/dL  Glucose, capillary     Status: Abnormal   Collection Time: 05/21/15  6:06 AM  Result Value Ref Range   Glucose-Capillary 123 (H) 65 - 99 mg/dL   Comment 1 Notify RN    Comment 2 Document in Chart   Glucose, capillary     Status: Abnormal   Collection Time: 05/21/15 12:01 PM  Result Value Ref Range   Glucose-Capillary 183 (H) 65 - 99 mg/dL   Comment 1 Notify RN    Comment 2 Document in Chart   Glucose, capillary     Status: Abnormal   Collection Time: 05/21/15  5:09 PM  Result Value Ref Range   Glucose-Capillary 229 (H) 65 - 99 mg/dL   Comment 1 Document in Chart   Glucose, capillary     Status: Abnormal   Collection Time: 05/21/15  9:30 PM  Result Value Ref Range   Glucose-Capillary 247 (H) 65 - 99 mg/dL  Glucose, capillary     Status: Abnormal   Collection Time: 05/22/15  6:13 AM  Result Value Ref Range   Glucose-Capillary 59 (L) 65 - 99 mg/dL   Comment 1 Notify RN   Glucose, capillary     Status: None   Collection Time: 05/22/15  6:29 AM  Result Value Ref Range   Glucose-Capillary 90 65 - 99 mg/dL   Comment 1 Notify RN   Glucose, capillary     Status: Abnormal   Collection Time: 05/22/15 12:21 PM  Result Value Ref Range    Glucose-Capillary 149 (H) 65 - 99 mg/dL    Physical Findings: AIMS:  , ,  ,  ,    CIWA:  CIWA-Ar Total: 6 COWS:     PLAN OF CARE: Supportive approach/copign skills  Detox as needed  Reassess and address the co morbidities  Explore residential treatment options  Medical Decision Making: Review of Psycho-Social Stressors (1), Review or order clinical lab tests (1), Review of Medication Regimen & Side Effects (2) and Review of New Medication or Change in Dosage (2)  Velna Hatchet May Karel Turpen AGNP-BC 05/22/2015, 1:54 PM

## 2015-05-22 NOTE — [facility] Counselor (Signed)
Additional attempt to awaken patient at 2:40 PM was unsuccessful in attempt to complete PSA. Understand from MHT patient did get up for lunch and go to cafeteria thus writer will attempt prior to dinner at 5-5:15 PM.  Carney Bern, LCSW

## 2015-05-22 NOTE — Progress Notes (Signed)
Psychoeducational Group Note  Date:  05/22/2015 Time:  2330  Group Topic/Focus:  Wrap-Up Group:   The focus of this group is to help patients review their daily goal of treatment and discuss progress on daily workbooks.  Participation Level: Did Not Attend  Participation Quality:  Not Applicable  Affect:  Not Applicable  Cognitive:  Not Applicable  Insight:  Not Applicable  Engagement in Group: Not Applicable  Additional Comments:  The patient did not attend the A. A. Meeting despite being encouraged to do so. She complained of feeling dizzy and weak.   Hazle Coca S 05/22/2015, 11:31 PM

## 2015-05-22 NOTE — [facility] Group Notes (Signed)
[facility] Group Notes: (Clinical Social Work)   05/22/2015      Type of Therapy:  Group Therapy   Participation Level:  Did Not Attend despite MHT prompting   Giavanni Odonovan Grossman-Orr, LCSW 05/22/2015, 12:33 PM     

## 2015-05-22 NOTE — Progress Notes (Addendum)
Adult Psychoeducational Group Note  Date:  05/22/2015 Time: 09:00am  Group Topic/Focus:  Coping With Mental Health Crisis:   The purpose of this group is to help patients identify strategies for coping with mental health crisis.  Group discusses possible causes of crisis and ways to manage them effectively.  Participation Level:  Did Not Attend  Participation Quality:  n/a  Affect: n/a  Cognitive: n/a  Insight: n/a  Engagement in Group: n/a  Modes of Intervention:  Clarification, Discussion, Education, Orientation, Problem-solving and Support  Additional Comments:  Pt did not attend group. Pt in bed asleep.   Aurora Mask 05/22/2015, 3:27 PM

## 2015-05-22 NOTE — Progress Notes (Signed)
Patient ID: Diana Santos, female   DOB: 08-23-1961, 54 y.o.   MRN: [id number]  Pt currently presents with a flat affect and depressed behavior. Pt remains in bed for most of the day today. Pt has eaten throughout the day today, with encouragement. Pt reports good sleep, low energy and a good appetite. Pt has remained in bed for most of the day. Pt is drowsy and has not attended any groups on the unit.   Pt provided with medications per providers orders. Pt's labs and vitals were monitored throughout the day. Pt supported emotionally and encouraged to express concerns and questions. Pt educated on medications and fluid/diet management for diabetics.   Pt's safety ensured with 15 minute and environmental checks. Pt currently denies SI/HI and A/V hallucinations. Pt verbally agrees to seek staff if SI/HI or A/VH occurs and to consult with staff before acting on these thoughts. Will continue POC.

## 2015-05-23 LAB — GLUCOSE, CAPILLARY
GLUCOSE-CAPILLARY: 508 mg/dL — AB (ref 65–99)
GLUCOSE-CAPILLARY: 113 mg/dL — AB (ref 65–99)
Glucose-Capillary: 157 mg/dL — ABNORMAL HIGH (ref 65–99)
Glucose-Capillary: 135 mg/dL — ABNORMAL HIGH (ref 65–99)
Glucose-Capillary: 314 mg/dL — ABNORMAL HIGH (ref 65–99)

## 2015-05-23 NOTE — [facility] Group Notes (Signed)
[facility] Group Notes:  (Nursing/MHT/Case Management/Adjunct)  Date:  05/23/2015  Time: 0900 Type of Therapy:  Nurse Education  Participation Level:  Did Not Attend    Summary of Progress/Problems:  Diana Santos [name] 05/23/2015, 1:23 PM 

## 2015-05-23 NOTE — [facility] Group Notes (Signed)
[facility] Group Notes: (Clinical Social Work)   05/23/2015      Type of Therapy:  Group Therapy   Participation Level:  Did Not Attend despite MHT prompting   Ambrose Mantle, LCSW 05/23/2015, 1:07 PM

## 2015-05-23 NOTE — Progress Notes (Signed)
Patient ID: Diana Santos, female   DOB: 05/07/1961, 54 y.o.   MRN: [id number] [facility] MD Progress Note  05/23/2015 12:30 PM Ty Oshima  MRN:  [id number] Subjective:  "No complaints."   Objective:  54 Y/o female who states she came wanting help with her depression and her substance abuse. She states she has been drinking ( 4) 40 ounces a day for month and a half. Also uses Cocaine 3-4 times a week.  She remains somnolent but observed to go to window to get meds.  Per nursing, frequent encouragement made to attend groups and not isolate in room.  She is tolerating med regimen.  She is denying withdrawal symptoms.  Principal Problem: Alcohol use disorder, severe, dependence Diagnosis:   Patient Active Problem List   Diagnosis Date Noted  . Substance abuse [F19.10]   . Alcohol use disorder, severe, dependence [F10.20] 02/25/2015  . Diabetes [E11.9] 04/08/2014  . Major depression [F32.2] 04/07/2014  . Alcohol dependence [F10.20] 08/26/2013  . Cocaine dependence [F14.20] 08/26/2013  . 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 Diabetes mellitus without complication   . Hypertension   . Asthma   . Arthritis   . Bipolar 1 disorder, depressed   . Substance abuse     Past Surgical History  Procedure Laterality Date  . C section     Family History: History reviewed. No pertinent family history. Social History:  History  Alcohol Use  . Yes    Comment: three to four 40oz beers daily     History  Drug Use  . Yes  . Special: Cocaine, Marijuana    Comment: crack cocaine daily    History   Social History  . Marital Status: Single    Spouse Name: N/A  . Number of Children: N/A  . Years of Education: N/A   Social History Main Topics  . Smoking status: Current Some Day Smoker -- 1.00 packs/day    Types: Cigarettes  . Smokeless tobacco: Never  Used  . Alcohol Use: Yes     Comment: three to four 40oz beers daily  . Drug Use: Yes    Special: Cocaine, Marijuana     Comment: crack cocaine daily  . Sexual Activity: Not Currently   Other Topics Concern  . None   Social History Narrative   Additional History:    Sleep: Fair  Appetite:  Fair   Assessment:   Musculoskeletal: Strength & Muscle Tone: within normal limits Gait & Station: normal Patient leans: N/A   Psychiatric Specialty Exam: Physical Exam  Vitals reviewed. Psychiatric: Her mood appears anxious. She is slowed. She exhibits a depressed mood.    Review of Systems  Constitutional: Negative for fever.  Eyes: Negative for blurred vision.  Respiratory: Negative for cough.   Cardiovascular: Negative for chest pain.  Gastrointestinal: Negative for heartburn.  Genitourinary: Negative for dysuria.  Musculoskeletal: Negative for myalgias.  Neurological: Negative for dizziness and headaches.  Endo/Heme/Allergies: Does not bruise/bleed easily.  Psychiatric/Behavioral: Positive for substance abuse. The patient is nervous/anxious.     Blood pressure 138/81, pulse 79, temperature 98.4 F (36.9 C), temperature source Oral, resp. rate 16, height  (1.6 m), weight 57.607 kg (127 lb), last menstrual period 06/30/2013, SpO2 99 %.Body mass index is 22.5 kg/(m^2).   General Appearance: Casual and Disheveled  Eye Contact:: Good  Speech: Clear and Coherent  and Normal Rate  Volume: Normal  Mood: Anxious and Depressed  Affect: Congruent and Depressed  Thought Process: Coherent, Goal Directed and Intact  Orientation: Full (Time, Place, and Person)  Thought Content: Hallucinations: Auditory Visual  Suicidal Thoughts: No  Homicidal Thoughts: No  Memory: Immediate; Fair Recent; Fair Remote; Fair  Judgement: Fair  Insight: Shallow  Psychomotor Activity: Normal  Concentration: Good  Recall: NA  Fund of Knowledge:Fair   Language: Good  Akathisia: NA  Handed: Right  AIMS (if indicated):    Assets: Desire for Improvement  ADL's: Intact  Cognition: WNL  Sleep:  6.75 hrs        Current Medications: Current Facility-Administered Medications  Medication Dose Route Frequency Provider Last Rate Last Dose  . acetaminophen (TYLENOL) tablet 650 mg  650 mg Oral Q4H PRN Earney Navy, NP   650 mg at 05/22/15 0641  . alum & mag hydroxide-simeth (MAALOX/MYLANTA) 200-200-20 MG/5ML suspension 30 mL  30 mL Oral PRN Earney Navy, NP      . amoxicillin (AMOXIL) capsule 500 mg  500 mg Oral TID Shuvon B Rankin, NP   500 mg at 05/23/15 1216  . benzocaine (ORAJEL) 10 % mucosal gel   Mouth/Throat TID PRN Shuvon B Rankin, NP      . benztropine (COGENTIN) tablet 1 mg  1 mg Oral QHS Earney Navy, NP   1 mg at 05/22/15 2125  . citalopram (CELEXA) tablet 40 mg  40 mg Oral Daily Earney Navy, NP   40 mg at 05/23/15 0801  . feeding supplement (GLUCERNA SHAKE) (GLUCERNA SHAKE) liquid 237 mL  237 mL Oral BID BM Rachael Fee, MD   237 mL at 05/23/15 0958  . haloperidol (HALDOL) tablet 5 mg  5 mg Oral QHS Earney Navy, NP   5 mg at 05/22/15 2125  . insulin aspart (novoLOG) injection 0-15 Units  0-15 Units Subcutaneous TID WC Earney Navy, NP   15 Units at 05/23/15 1213  . insulin glargine (LANTUS) injection 25 Units  25 Units Subcutaneous QHS Earney Navy, NP   25 Units at 05/22/15 2200  . lisinopril (PRINIVIL,ZESTRIL) tablet 10 mg  10 mg Oral Daily Earney Navy, NP   10 mg at 05/23/15 0801  . LORazepam (ATIVAN) tablet 1 mg  1 mg Oral Q8H PRN Earney Navy, NP      . metFORMIN (GLUCOPHAGE) tablet 1,000 mg  1,000 mg Oral BID WC Earney Navy, NP   1,000 mg at 05/23/15 0801  . nicotine (NICODERM CQ - dosed in mg/24 hours) patch 21 mg  21 mg Transdermal Q0600 Rachael Fee, MD   21 mg at 05/23/15 0349  . ondansetron (ZOFRAN) tablet 4 mg  4 mg Oral Q8H PRN Earney Navy, NP      . pantoprazole (PROTONIX) EC tablet 40 mg  40 mg Oral Daily Earney Navy, NP   40 mg at 05/23/15 0801  . simvastatin (ZOCOR) tablet 40 mg  40 mg Oral QPM Earney Navy, NP   40 mg at 05/22/15 1726  . traZODone (DESYREL) tablet 100 mg  100 mg Oral QHS Earney Navy, NP   100 mg at 05/22/15 2125    Lab Results:  Results for orders placed or performed during the hospital encounter of 05/20/15 (from the past 48 hour(s))  Glucose, capillary     Status: Abnormal   Collection Time: 05/21/15  5:09 PM  Result Value Ref Range  Glucose-Capillary 229 (H) 65 - 99 mg/dL   Comment 1 Document in Chart   Glucose, capillary     Status: Abnormal   Collection Time: 05/21/15  9:30 PM  Result Value Ref Range   Glucose-Capillary 247 (H) 65 - 99 mg/dL  Glucose, capillary     Status: Abnormal   Collection Time: 05/22/15  6:13 AM  Result Value Ref Range   Glucose-Capillary 59 (L) 65 - 99 mg/dL   Comment 1 Notify RN   Glucose, capillary     Status: None   Collection Time: 05/22/15  6:29 AM  Result Value Ref Range   Glucose-Capillary 90 65 - 99 mg/dL   Comment 1 Notify RN   Glucose, capillary     Status: Abnormal   Collection Time: 05/22/15 12:21 PM  Result Value Ref Range   Glucose-Capillary 149 (H) 65 - 99 mg/dL  Glucose, capillary     Status: Abnormal   Collection Time: 05/22/15  5:05 PM  Result Value Ref Range   Glucose-Capillary 172 (H) 65 - 99 mg/dL  Glucose, capillary     Status: Abnormal   Collection Time: 05/22/15  8:48 PM  Result Value Ref Range   Glucose-Capillary 251 (H) 65 - 99 mg/dL   Comment 1 Notify RN   Glucose, capillary     Status: Abnormal   Collection Time: 05/23/15  5:30 AM  Result Value Ref Range   Glucose-Capillary 157 (H) 65 - 99 mg/dL   Comment 1 Notify RN   Glucose, capillary     Status: Abnormal   Collection Time: 05/23/15 12:06 PM  Result Value Ref Range   Glucose-Capillary 508 (H) 65 - 99 mg/dL    Physical Findings: AIMS:  , ,  ,   ,    CIWA:  CIWA-Ar Total: 6 COWS:     PLAN OF CARE: Supportive approach/copign skills  Detox as needed  Reassess and address the co morbidities  Explore residential treatment options  Medical Decision Making: Review of Psycho-Social Stressors (1), Review or order clinical lab tests (1), Review of Medication Regimen & Side Effects (2) and Review of New Medication or Change in Dosage (2)  Velna Hatchet May Charlii Yost AGNP-BC 05/23/2015, 12:30 PM

## 2015-05-23 NOTE — Progress Notes (Addendum)
D: Patient denies SI/HI. She denies AVH. She c/o leg pain 8/10. Presents with disheveled appearance. She has been observed in the bed throughout the shift. OOB to attend meals. Reports she has a good appetite.Reports good sleep. Patient reports s/s of withdrawal including nausea and vomiting.    A: Special checks q 15 mins in place for safety. Encouragement and counseling provided. Medication administered per MD order(see eMAR). Encouraged to go to group and interact on the unit.   R: Safety maintained. Compliant with medication Regimen. Pt refused prn pain medication for c/o leg pain. Patient did not attend group. Will continue to monitor.

## 2015-05-23 NOTE — Progress Notes (Addendum)
Pt CBG 508 mg/dl. MD notified. No new orders. Give scheduled coverage,do not obtain STAT labs. 15 units of Novolog insulin given as ordered (moderate scale coverage). Pt asymptomatic. Will continue to monitor.

## 2015-05-23 NOTE — Progress Notes (Signed)
Patient did not attend the evening speaker AA meeting. Pt was notified that group was beginning but remained in bed.   

## 2015-05-23 NOTE — Progress Notes (Signed)
D. Pt pleasant on approach, not feeling well.  Pt wished to go to bed after taking her night time medications.  Pt requested an ensure and some snacks before bed and this was given due to low blood sugar this past morning.  Denies SI/HI/hallucinations at this time.  A.  Support and encouragement offered  R.  Pt remains safe on unit, will continue to monitor.

## 2015-05-24 DIAGNOSIS — F333 Major depressive disorder, recurrent, severe with psychotic symptoms: Secondary | ICD-10-CM

## 2015-05-24 DIAGNOSIS — F1424 Cocaine dependence with cocaine-induced mood disorder: Secondary | ICD-10-CM

## 2015-05-24 LAB — GLUCOSE, CAPILLARY
GLUCOSE-CAPILLARY: 193 mg/dL — AB (ref 65–99)
GLUCOSE-CAPILLARY: 272 mg/dL — AB (ref 65–99)
Glucose-Capillary: 145 mg/dL — ABNORMAL HIGH (ref 65–99)
Glucose-Capillary: 198 mg/dL — ABNORMAL HIGH (ref 65–99)

## 2015-05-24 MED ORDER — MAGNESIUM HYDROXIDE 400 MG/5ML PO SUSP
30.0000 mL | Freq: Every day | ORAL | Status: DC | PRN
Start: 2015-05-24 — End: 2015-05-25
  Administered 2015-05-24: 30 mL via ORAL
  Filled 2015-05-24: qty 30

## 2015-05-24 NOTE — [facility] Group Notes (Signed)
[facility] LCSW Group Therapy 05/24/2015  1:15 pm  Type of Therapy: Group Therapy Participation Level: Active  Participation Quality: Attentive, Sharing and Supportive  Affect: Appropriate  Cognitive: Alert and Oriented  Insight: Developing/Improving and Engaged  Engagement in Therapy: Developing/Improving and Engaged  Modes of Intervention: Clarification, Confrontation, Discussion, Education, Exploration,  Limit-setting, Orientation, Problem-solving, Rapport Building, Dance movement psychotherapisteality Testing, Socialization and Support  Summary of Progress/Problems: Pt identified obstacles faced currently and processed barriers involved in overcoming these obstacles. Pt identified steps necessary for overcoming these obstacles and explored motivation (internal and external) for facing these difficulties head on. Pt further identified one area of concern in their lives and chose a goal to focus on for today. Patient identified being manipulated or "used" by others as an obstacle. She also shared that manipulating or lying to others in her active addiction as an obstacles. Patient reports that she is agreeable to getting involved in AA/NA to help hold her accountable. CSW and other group members provided patient with emotional support and encouragement.  Diana BruinKristin Aston Lieske, MSW, [employer]CSWA Clinical Social Worker [facility] [phone]

## 2015-05-24 NOTE — Progress Notes (Signed)
Patient ID: Baldemar FridayCarrie Kamphaus, female   DOB: 10/25/1961, 54 y.o.   MRN: [id number]  DAR: Pt. Denies SI/HI and A/V Hallucinations. Patient does not report any pain or discomfort at this time. Patient reported heartburn this afternoon and received PRN Maalox which she reports was effective. She rates her depression and anxiety 10/10 and her hopelessness 3/10. She reports her sleep as good, appetite is good, energy level is normal, and concentration is poor. Support and encouragement provided to the patient. Scheduled medications administered to patient per physician's orders. Patient is receptive and cooperative with Clinical research associatewriter. She is seen in the milieu interacting with peers and staff. Q15 minute checks are maintained for safety.

## 2015-05-24 NOTE — Plan of Care (Signed)
Problem: Ineffective individual coping Goal: STG: Patient will remain free from self harm Outcome: Progressing Patient remains free from self harm.      
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
## 2015-05-24 NOTE — Progress Notes (Signed)
Recreation Therapy Notes  Date: 06.27.16 Time: 9:30 am Location: [address] Dayroom  Group Topic: Stress Management  Goal Area(s) Addresses:  Patient will verbalize importance of using healthy stress management.  Patient will identify positive emotions associated with healthy stress management.   Intervention: Stress Management  Activity :  Guided Imagery Script.  LRT introduced and educated patients on stress management technique of guided imagery.  A script was used to to deliver the technique to patients.  Patients were asked to follow script read aloud by LRT to engage in the stress management technique.  Education:  Stress Management, Discharge Planning.   Clinical Observations/Feedback: Patient did not attend group.  Dakari Cregger , LRT/CTRS         Karthikeya Funke A 05/24/2015 5:07 PM 

## 2015-05-24 NOTE — Progress Notes (Signed)
Writer has observed patient up in the dayroom briefly until AA group started and she returned to her room to lie down. She reports that she would like to go to [facility] [place] once discharged for treatment. She reports that she know her triggers which are depression, money and friends. She report that [place] and feels this will be a good move for her. She denies si/hi/a/v hallucinations. Support and encouragement given, safety maintained on unit with 15 min checks.

## 2015-05-24 NOTE — [facility] Group Notes (Signed)
[facility]  05/24/2015  8:45 AM  Participation Quality: Did Not Attend. Patient invited to participate but declined.  Samuella BruinKristin Shaasia Odle, MSW, [employer]CSWA Clinical Social Worker [facility] [phone]

## 2015-05-25 DIAGNOSIS — F333 Major depressive disorder, recurrent, severe with psychotic symptoms: Secondary | ICD-10-CM | POA: Insufficient documentation

## 2015-05-25 DIAGNOSIS — F1424 Cocaine dependence with cocaine-induced mood disorder: Secondary | ICD-10-CM | POA: Insufficient documentation

## 2015-05-25 LAB — GLUCOSE, CAPILLARY
Glucose-Capillary: 79 mg/dL (ref 65–99)
Glucose-Capillary: 152 mg/dL — ABNORMAL HIGH (ref 65–99)

## 2015-05-25 MED ORDER — METFORMIN HCL 1000 MG PO TABS: 1000.0000 mg | ORAL_TABLET | Freq: Two times a day (BID) | ORAL | Status: DC

## 2015-05-25 MED ORDER — AMOXICILLIN 500 MG PO CAPS: 500.0000 mg | ORAL_CAPSULE | Freq: Three times a day (TID) | ORAL | Status: DC

## 2015-05-25 MED ORDER — HALOPERIDOL 5 MG PO TABS: 5.0000 mg | ORAL_TABLET | Freq: Every day | ORAL | Status: DC

## 2015-05-25 MED ORDER — INSULIN GLARGINE 100 UNIT/ML ~~LOC~~ SOLN: 25.0000 [IU] | Freq: Every day | SUBCUTANEOUS | Status: DC

## 2015-05-25 MED ORDER — SIMVASTATIN 40 MG PO TABS: 40.0000 mg | ORAL_TABLET | Freq: Every evening | ORAL | Status: DC

## 2015-05-25 MED ORDER — BENZTROPINE MESYLATE 1 MG PO TABS: 1.0000 mg | ORAL_TABLET | Freq: Every day | ORAL | Status: DC

## 2015-05-25 MED ORDER — NICOTINE 21 MG/24HR TD PT24: 21.0000 mg | MEDICATED_PATCH | Freq: Every day | TRANSDERMAL | Status: DC

## 2015-05-25 MED ORDER — CITALOPRAM HYDROBROMIDE 40 MG PO TABS: 40.0000 mg | ORAL_TABLET | Freq: Every day | ORAL | Status: DC

## 2015-05-25 MED ORDER — PANTOPRAZOLE SODIUM 40 MG PO TBEC: 40.0000 mg | DELAYED_RELEASE_TABLET | Freq: Every day | ORAL | Status: DC

## 2015-05-25 MED ORDER — LISINOPRIL 10 MG PO TABS: 10.0000 mg | ORAL_TABLET | Freq: Every day | ORAL | Status: DC

## 2015-05-25 MED ORDER — TRAZODONE HCL 100 MG PO TABS: 100.0000 mg | ORAL_TABLET | Freq: Every day | ORAL | Status: DC

## 2015-05-25 MED ORDER — INSULIN ASPART 100 UNIT/ML ~~LOC~~ SOLN: 4.0000 [IU] | Freq: Every day | SUBCUTANEOUS | Status: DC

## 2015-05-25 NOTE — [facility] Suicide Risk Assessment (Signed)
[facility] Discharge Suicide Risk Assessment   Demographic Factors:  NA  Total Time spent with patient: 30 minutes  Musculoskeletal: Strength & Muscle Tone: within normal limits Gait & Station: normal Patient leans: normal  Psychiatric Specialty Exam: Physical Exam  Review of Systems  Constitutional: Negative.   HENT: Negative.   Eyes: Negative.   Respiratory: Negative.   Cardiovascular: Negative.   Gastrointestinal: Negative.   Genitourinary: Negative.   Musculoskeletal: Negative.   Skin: Negative.   Neurological: Negative.   Endo/Heme/Allergies: Negative.   Psychiatric/Behavioral: Positive for depression and substance abuse.    Blood pressure 97/70, pulse 73, temperature 97.9 F (36.6 C), temperature source Oral, resp. rate 16, height  (1.6 m), weight 57.607 kg (127 lb), last menstrual period 06/30/2013, SpO2 99 %.Body mass index is 22.5 kg/(m^2).  General Appearance: Fairly Groomed  Patent attorney::  Fair  Speech:  Clear and Coherent409  Volume:  Normal  Mood:  Euthymic  Affect:  Appropriate  Thought Process:  Coherent and Goal Directed  Orientation:  Full (Time, Place, and Person)  Thought Content:  plans as she moves on, relapse prevention plan  Suicidal Thoughts:  No  Homicidal Thoughts:  No  Memory:  Immediate;   Fair Recent;   Fair Remote;   Fair  Judgement:  Fair  Insight:  Present  Psychomotor Activity:  Normal  Concentration:  Fair  Recall:  [employer] of Knowledge:Fair  Language: Fair  Akathisia:  No  Handed:  Right  AIMS (if indicated):     Assets:  Desire for Improvement  Sleep:  Number of Hours: 6.25  Cognition: WNL  ADL's:  Intact   Have you used any form of tobacco in the last 30 days? (Cigarettes, Smokeless Tobacco, Cigars, and/or Pipes): Yes  Has this patient used any form of tobacco in the last 30 days? (Cigarettes, Smokeless Tobacco, Cigars, and/or Pipes) Yes, A prescription for an FDA-approved tobacco cessation medication was offered at  discharge and the patient refused  Mental Status Per Nursing Assessment::   On Admission:  NA  Current Mental Status by Physician: IN full contact with reality. There are no active S/S of withdrawal. There are no active SI plans or intent. She is willing and motivated to pursue a residential treatment program. She is going to stay at her mothers while waiting for a bed to become available. States her mother's is a safe place   Loss Factors: Decline in physical health  Historical Factors: NA  Risk Reduction Factors:   Sense of responsibility to family, Living with another person, especially a relative and Positive social support  Continued Clinical Symptoms:  Depression:   Comorbid alcohol abuse/dependence Alcohol/Substance Abuse/Dependencies  Cognitive Features That Contribute To Risk:  Closed-mindedness, Polarized thinking and Thought constriction (tunnel vision)    Suicide Risk:  Minimal: No identifiable suicidal ideation.  Patients presenting with no risk factors but with morbid ruminations; may be classified as minimal risk based on the severity of the depressive symptoms  Principal Problem: Alcohol use disorder, severe, dependence Discharge Diagnoses:  Patient Active Problem List   Diagnosis Date Noted  . Substance abuse [F19.10]   . Alcohol use disorder, severe, dependence [F10.20] 02/25/2015  . Diabetes [E11.9] 04/08/2014  . Major depression [F32.2] 04/07/2014  . Alcohol dependence [F10.20] 08/26/2013  . Cocaine dependence [F14.20] 08/26/2013  . Severe major depression with psychotic features [F32.3] 08/26/2013    Follow-up Information    Follow up with [facility].   Why:  Please contact Melissa weekly  regarding status of referral sent 05/25/15.   Contact information:   [address] [place] [phone]      Follow up with [facility] On 06/10/2015.   Why:  Dr Clovis RileyMitchell has retired from this practice, patient scheduled with NP  Jess Bartersafaella Thomas at 4 PM on 7/14   Contact information:   [address] [place], [place] [address] Phone:([phone] Fax:  [phone]       Follow up with [facility].   Why:  Currently not accepting referrals for waitlist. Staff advised that you call back at the end of July to have your name put on the wait list.   Contact information:   [address] [place], [place]  [phone]      Follow up with [facility] On 05/28/2015.   Why:  [facility]  Ask for Ellison CarwinFrances Gill at check in.     Contact information:   [address] [place], [place] Phone:  [phone] Fax: [phone]      Plan Of Care/Follow-up recommendations:  Activity:  as tolerated Diet:  regular Follow up [facility] will wait for a bed at [facility] Is patient on multiple antipsychotic therapies at discharge:  No   Has Patient had three or more failed trials of antipsychotic monotherapy by history:  No  Recommended Plan for Multiple Antipsychotic Therapies: N/A     Leetta Hendriks A 05/25/2015, 12:39 PM

## 2015-05-25 NOTE — Clinical Social Work Note (Signed)
Juanita at [facility] states facility is not taking any referrals at this time, will not put patients on waiting list.  Advises that patient call back at end of July to get on wait list.  Santa GeneraAnne Cunningham, LCSW Clinical Social Worker

## 2015-05-25 NOTE — Progress Notes (Signed)
Pt d/c [place] bus stop with bus pass and $4. All items returned. D/C instructions, prescriptions and samples given. Pt denies si and hi.

## 2015-05-25 NOTE — Clinical Social Work Note (Signed)
[facility] at [facility]'s [facility].   Samuella BruinKristin Shareef Eddinger, MSW, [facility] CampusCSWA Clinical Social Worker [facility] [phone]

## 2015-05-25 NOTE — Tx Team (Signed)
Interdisciplinary Treatment Plan Update (Adult) Date: 05/25/2015   Time Reviewed: 9:30 AM  Progress in Treatment: Attending groups: Yes Participating in groups: Yes Taking medication as prescribed: Yes Tolerating medication: Yes Family/Significant other contact made: Yes, with mother Patient understands diagnosis: Yes Discussing patient identified problems/goals with staff: Yes Medical problems stabilized or resolved: Yes Denies suicidal/homicidal ideation: Yes Issues/concerns per patient self-inventory: Yes Other:  New problem(s) identified: N/A  Discharge Plan or Barriers:   6/24: CSW continuing to assess, patient new to milieu 05/25/2015: [facility] [place].   Reason for Continuation of Hospitalization:  Depression Anxiety Medication Stabilization   Comments: N/A  Estimated length of stay: 0 days; Pt stable for DC today  For review of initial/current patient goals, please see plan of care. Patient is a 54 year old female admitted for depression, history of bipolar disorder, and detox from alcohol and crack cocaine. Patient will benefit from crisis stabilization, medication evaluation, group therapy, and psycho education in addition to case management for discharge planning. Patient and CSW reviewed pt's identified goals and treatment plan. Pt verbalized understanding and agreed to treatment plan.   Attendees:  Patient:    Family:    Physician: Dr. Dub MikesLugo MD  05/25/2015 9:42 AM  Nursing: Onnie BoerJennifer Clark, RN Case manager  05/25/2015 9:42 AM  Clinical Social Worker Lamar SprinklesLauren Carter, LCSWA, MSW 05/25/2015 9:42 AM  Other: Leisa LenzValerie Enoch, Vesta MixerMonarch Liasion 05/25/2015 9:42 AM  Clinical:  Keitha ButteAndrea Thorn, RN; Merian CapronMarian Friedman, RN; Waynetta SandyJan Wright, RN 05/25/2015 9:42 AM  Other: , RN Charge Nurse 05/25/2015 9:42 AM  Other:       Scribe for Treatment Team:  Chad CordialLauren Carter, Theresia MajorsLCSWA [phone]

## 2015-05-25 NOTE — Progress Notes (Signed)
  [facility] Discharge [facility] :  Will you be returning to the same living situation after discharge:  Yes,  home with mother At discharge, do you have transportation home?: Yes,  bus passes and PART fare provided Do you have the ability to pay for your medications: Yes,  Pt provided with supply and prescriptions  Release of information consent forms completed and in the chart;  Patient's signature needed at discharge.  Patient to Follow up at: Follow-up Information    Follow up with [facility].   Why:  Please contact Melissa weekly regarding status of referral sent 05/25/15.   Contact information:   [address] [place] [phone]      Follow up with [facility] On 06/10/2015.   Why:  Dr Clovis RileyMitchell has retired from this practice, patient scheduled with NP Jess Bartersafaella Thomas at 4 PM on 7/14   Contact information:   [address] [place], [place] [address] [phone]) [phone] Fax:  [phone]       Follow up with [facility].   Why:  Currently not accepting referrals for waitlist. Staff advised that you call back at the end of July to have your name put on the wait list.   Contact information:   [address] [place], [place]  [phone]      Follow up with [facility] On 05/28/2015.   Why:  [facility]ospital [facility]  Ask for Ellison CarwinFrances Gill at check in.     Contact information:   [address] [place], [place] Phone:  [phone] Fax: [phone]      Patient denies SI/HI: Yes,  Pt denies    Safety Planning and Suicide Prevention discussed: Yes,  with mother.   Have you used any form of tobacco in the last 30 days? (Cigarettes, Smokeless Tobacco, Cigars, and/or Pipes): Yes  Has patient been referred to the Quitline?: Patient refused referral  Elaina HoopsCarter, Ermalinda Joubert M 05/25/2015, 12:03 PM

## 2015-05-25 NOTE — Progress Notes (Signed)
Recreation Therapy Notes  Animal-Assisted Activity (AAA) Program Checklist/Progress Notes Patient Eligibility Criteria Checklist & Daily Group note for Rec Tx Intervention  Date: 06.28.16 Time: 2:45 pm [facility]  AAA/T Program Assumption of Risk Form signed by Patient/ or Parent Legal Guardian yes  Patient is free of allergies or sever asthma yes  Patient reports no fear of animals yes  Patient reports no history of cruelty to animalsyes  Patient understands his/her participation is voluntary yes  Patient washes hands before animal contact yes  Patient washes hands after animal contact yes  Education: Hand Washing, Appropriate Animal Interaction   Clinical Observations/Feedback: Patient did not attend group.   Caroll RancherMarjette Aniston Christman, LRT/CTRS         Caroll RancherLindsay, Tiffanie Blassingame A 05/25/2015 4:08 PM

## 2015-05-25 NOTE — Discharge Summary (Signed)
Physician Discharge Summary Note  Patient:  Diana Santos is an 54 y.o., female MRN:  [id number] DOB:  Aug 06, 1961 Patient phone:  [phone] (home)  Patient address:   [address] [place] [address],  Total Time spent with patient: 45 minutes  Date of Admission:  05/20/2015 Date of Discharge: 05/25/2015  Reason for Admission:  Alcohol abuse  Principal Problem: Alcohol use disorder, severe, dependence Discharge Diagnoses: Patient Active Problem List   Diagnosis Date Noted  . Cocaine dependence with cocaine-induced mood disorder [F14.24]   . Severe recurrent major depressive disorder with psychotic features [F33.3]   . Substance abuse [F19.10]   . Alcohol use disorder, severe, dependence [F10.20] 02/25/2015  . Diabetes [E11.9] 04/08/2014  . Major depression [F32.2] 04/07/2014  . Alcohol dependence [F10.20] 08/26/2013  . Cocaine dependence [F14.20] 08/26/2013  . Severe major depression with psychotic features [F32.3] 08/26/2013    Musculoskeletal: Strength & Muscle Tone: within normal limits Gait & Station: normal Patient leans: N/A  Psychiatric Specialty Exam:  SEE SRA Physical Exam  Vitals reviewed. Psychiatric: She has a normal mood and affect.    Review of Systems  All other systems reviewed and are negative.   Blood pressure 97/70, pulse 73, temperature 97.9 F (36.6 C), temperature source Oral, resp. rate 16, height  (1.6 m), weight 57.607 kg (127 lb), last menstrual period 06/30/2013, SpO2 99 %.Body mass index is 22.5 kg/(m^2).  Have you used any form of tobacco in the last 30 days? (Cigarettes, Smokeless Tobacco, Cigars, and/or Pipes): Yes  Has this patient used any form of tobacco in the last 30 days? (Cigarettes, Smokeless Tobacco, Cigars, and/or Pipes) Yes, A prescription for an FDA-approved tobacco cessation medication was offered at discharge and the patient refused samples and Rx given  Past Medical History:  Past Medical History  Diagnosis Date   . Diabetes mellitus without complication   . Hypertension   . Asthma   . Arthritis   . Bipolar 1 disorder, depressed   . Substance abuse     Past Surgical History  Procedure Laterality Date  . C section     Family History: History reviewed. No pertinent family history. Social History:  History  Alcohol Use  . Yes    Comment: three to four 40oz beers daily     History  Drug Use  . Yes  . Special: Cocaine, Marijuana    Comment: crack cocaine daily    History   Social History  . Marital Status: Single    Spouse Name: N/A  . Number of Children: N/A  . Years of Education: N/A   Social History Main Topics  . Smoking status: Current Some Day Smoker -- 1.00 packs/day    Types: Cigarettes  . Smokeless tobacco: Never Used  . Alcohol Use: Yes     Comment: three to four 40oz beers daily  . Drug Use: Yes    Special: Cocaine, Marijuana     Comment: crack cocaine daily  . Sexual Activity: Not Currently   Other Topics Concern  . None   Social History Narrative    Risk to Self: Is patient at risk for suicide?: No What has been your use of drugs/alcohol within the last 12 months?: Five 40 oz beers daily for 1.5 to 2 months plus 1 pint of liquor daily in addition to smoking $100 of cocaine 3-4 times weekly Risk to Others:   Prior Inpatient Therapy:   Prior Outpatient Therapy:    Level of Care:  OP  Hospital Course:  Meoshia Billing was admitted for Alcohol use disorder, severe, dependence and crisis management.  She was treated discharged with the medications listed below under Medication List.  Medical problems were identified and treated as needed.  Home medications were restarted as appropriate.  Improvement was monitored by observation and [name] daily report of symptom reduction.  Emotional and mental status was monitored by daily self-inventory reports completed by [name] and clinical staff.         Lakely Elmendorf was evaluated by the treatment  team for stability and plans for continued recovery upon discharge.  Oretta Berkland motivation was an integral factor for scheduling further treatment.  Employment, transportation, bed availability, health status, family support, and any pending legal issues were also considered during her hospital stay.  She was offered further treatment options upon discharge including but not limited to Residential, Intensive Outpatient, and Outpatient treatment.  Mariaha Ellington will follow up with the services as listed below under Follow Up Information.     Upon completion of this admission the patient was both mentally and medically stable for discharge denying suicidal/homicidal ideation, auditory/visual/tactile hallucinations, delusional thoughts and paranoia.      Consults:  psychiatry  Significant Diagnostic Studies:  labs: Per ED  Discharge Vitals:   Blood pressure 97/70, pulse 73, temperature 97.9 F (36.6 C), temperature source Oral, resp. rate 16, height 5\' 3"  (1.6 m), weight 57.607 kg (127 lb), last menstrual period 06/30/2013, SpO2 99 %. Body mass index is 22.5 kg/(m^2). Lab Results:   Results for orders placed or performed during the hospital encounter of 05/20/15 (from the past 72 hour(s))  Glucose, capillary     Status: Abnormal   Collection Time: 05/22/15  8:48 PM  Result Value Ref Range   Glucose-Capillary 251 (H) 65 - 99 mg/dL   Comment 1 Notify RN   Glucose, capillary     Status: Abnormal   Collection Time: 05/23/15  5:30 AM  Result Value Ref Range   Glucose-Capillary 157 (H) 65 - 99 mg/dL   Comment 1 Notify RN   Glucose, capillary     Status: Abnormal   Collection Time: 05/23/15 12:06 PM  Result Value Ref Range   Glucose-Capillary 508 (H) 65 - 99 mg/dL  Glucose, capillary     Status: Abnormal   Collection Time: 05/23/15 12:53 PM  Result Value Ref Range   Glucose-Capillary 135 (H) 65 - 99 mg/dL  Glucose, capillary     Status: Abnormal   Collection Time: 05/23/15  5:21 PM   Result Value Ref Range   Glucose-Capillary 314 (H) 65 - 99 mg/dL  Glucose, capillary     Status: Abnormal   Collection Time: 05/23/15  8:41 PM  Result Value Ref Range   Glucose-Capillary 113 (H) 65 - 99 mg/dL   Comment 1 Notify RN    Comment 2 Document in Chart   Glucose, capillary     Status: Abnormal   Collection Time: 05/24/15  6:31 AM  Result Value Ref Range   Glucose-Capillary 145 (H) 65 - 99 mg/dL   Comment 1 Notify RN    Comment 2 Document in Chart   Glucose, capillary     Status: Abnormal   Collection Time: 05/24/15 12:13 PM  Result Value Ref Range   Glucose-Capillary 193 (H) 65 - 99 mg/dL   Comment 1 Notify RN   Glucose, capillary     Status: Abnormal   Collection Time: 05/24/15  5:10 PM  Result Value  Ref Range   Glucose-Capillary 198 (H) 65 - 99 mg/dL  Glucose, capillary     Status: Abnormal   Collection Time: 05/24/15  9:16 PM  Result Value Ref Range   Glucose-Capillary 272 (H) 65 - 99 mg/dL  Glucose, capillary     Status: None   Collection Time: 05/25/15  6:09 AM  Result Value Ref Range   Glucose-Capillary 79 65 - 99 mg/dL  Glucose, capillary     Status: Abnormal   Collection Time: 05/25/15 11:50 AM  Result Value Ref Range   Glucose-Capillary 152 (H) 65 - 99 mg/dL   Comment 1 Notify RN     Physical Findings: AIMS:  , ,  ,  ,    CIWA:  CIWA-Ar Total: 2 COWS:      See Psychiatric Specialty Exam and Suicide Risk Assessment completed by Attending Physician prior to discharge.  Discharge destination:  Home  Is patient on multiple antipsychotic therapies at discharge:  No   Has Patient had three or more failed trials of antipsychotic monotherapy by history:  No    Recommended Plan for Multiple Antipsychotic Therapies: NA     Medication List    STOP taking these medications        benzocaine 10 % mucosal gel  Commonly known as:  ORAJEL     MELATONIN PO     omeprazole 20 MG capsule  Commonly known as:  PRILOSEC  Replaced by:  pantoprazole 40  MG tablet      TAKE these medications      Indication   amoxicillin 500 MG capsule  Commonly known as:  AMOXIL  Take 1 capsule (500 mg total) by mouth 3 (three) times daily.   Indication:  Dental abscess     benztropine 1 MG tablet  Commonly known as:  COGENTIN  Take 1 tablet (1 mg total) by mouth at bedtime.   Indication:  Extrapyramidal Reaction caused by Medications     citalopram 40 MG tablet  Commonly known as:  CELEXA  Take 1 tablet (40 mg total) by mouth daily.   Indication:  Depression     haloperidol 5 MG tablet  Commonly known as:  HALDOL  Take 1 tablet (5 mg total) by mouth at bedtime.   Indication:  Psychosis     insulin aspart 100 UNIT/ML injection  Commonly known as:  novoLOG  Inject 4-15 Units into the skin daily. Diabetes management   Indication:  Type 2 Diabetes     insulin glargine 100 UNIT/ML injection  Commonly known as:  LANTUS  Inject 0.25 mLs (25 Units total) into the skin at bedtime.   Indication:  Type 2 Diabetes     lisinopril 10 MG tablet  Commonly known as:  PRINIVIL,ZESTRIL  Take 1 tablet (10 mg total) by mouth daily.   Indication:  High Blood Pressure     metFORMIN 1000 MG tablet  Commonly known as:  GLUCOPHAGE  Take 1 tablet (1,000 mg total) by mouth 2 (two) times daily with a meal.   Indication:  Type 2 Diabetes     nicotine 21 mg/24hr patch  Commonly known as:  NICODERM CQ - dosed in mg/24 hours  Place 1 patch (21 mg total) onto the skin daily at 6 (six) AM.   Indication:  Nicotine Addiction     pantoprazole 40 MG tablet  Commonly known as:  PROTONIX  Take 1 tablet (40 mg total) by mouth daily.   Indication:  Gastroesophageal Reflux Disease  simvastatin 40 MG tablet  Commonly known as:  ZOCOR  Take 1 tablet (40 mg total) by mouth every evening.   Indication:  Inherited Homozygous Hypercholesterolemia, Nonfamilial Heterozygous Hypercholesterolemia     traZODone 100 MG tablet  Commonly known as:  DESYREL  Take 1 tablet  (100 mg total) by mouth at bedtime.   Indication:  Trouble Sleeping           Follow-up Information    Follow up with [facility].   Why:  Please contact Melissa weekly regarding status of referral sent 05/25/15.   Contact information:   [address] [place] [phone]      Follow up with [facility] On 06/10/2015.   Why:  Dr Clovis RileyMitchell has retired from this practice, patient scheduled with NP Jess Bartersafaella Thomas at 4 PM on 7/14   Contact information:   [address] [place], [place] [address] [phone] Fax:  [phone]       Follow up with [facility].   Why:  Currently not accepting referrals for waitlist. Staff advised that you call back at the end of July to have your name put on the wait list.   Contact information:   [address] [place], [place]  [phone]      Follow up with [facility] On 05/28/2015.   Why:  [facility]  Ask for Ellison CarwinFrances Gill at check in.     Contact information:   [address] [place], [place] Phone:  [phone] Fax: [phone]      Follow-up recommendations:  Activity:  as tol,diet as tol  Comments:  Treatment Plan/Recommendations:  Admit for crisis management and mood stabilization. Medication management to re-stabilize current mood symptoms Group counseling sessions for coping skills Medical consults as needed Review and reinstate any pertinent home medications for other health problems   Total Discharge Time:  30 min  Signed: Velna HatchetSheila May Agustin AGNP-BC 05/25/2015, 5:28 PM  I personally assessed the patient and formulated the plan Madie RenoIrving A. Dub MikesLugo, M.D.

## 2015-06-14 ENCOUNTER — Emergency Department (HOSPITAL_COMMUNITY)
Admission: EM | Admit: 2015-06-14 | Discharge: 2015-06-15 | Disposition: A | Discharge status: Home / Self Care | Payer: Self-pay | Attending: Emergency Medicine | Admitting: Emergency Medicine

## 2015-06-14 ENCOUNTER — Encounter (HOSPITAL_COMMUNITY): Payer: Self-pay | Admitting: Emergency Medicine

## 2015-06-14 DIAGNOSIS — R45851 Suicidal ideations: Secondary | ICD-10-CM

## 2015-06-14 DIAGNOSIS — I10 Essential (primary) hypertension: Secondary | ICD-10-CM | POA: Insufficient documentation

## 2015-06-14 DIAGNOSIS — F319 Bipolar disorder, unspecified: Secondary | ICD-10-CM | POA: Insufficient documentation

## 2015-06-14 DIAGNOSIS — F1010 Alcohol abuse, uncomplicated: Secondary | ICD-10-CM | POA: Insufficient documentation

## 2015-06-14 DIAGNOSIS — Z720 Tobacco use: Secondary | ICD-10-CM | POA: Insufficient documentation

## 2015-06-14 DIAGNOSIS — J45909 Unspecified asthma, uncomplicated: Secondary | ICD-10-CM | POA: Insufficient documentation

## 2015-06-14 DIAGNOSIS — Z79899 Other long term (current) drug therapy: Secondary | ICD-10-CM | POA: Insufficient documentation

## 2015-06-14 DIAGNOSIS — F1420 Cocaine dependence, uncomplicated: Secondary | ICD-10-CM | POA: Diagnosis present

## 2015-06-14 DIAGNOSIS — E119 Type 2 diabetes mellitus without complications: Secondary | ICD-10-CM | POA: Insufficient documentation

## 2015-06-14 DIAGNOSIS — Z794 Long term (current) use of insulin: Secondary | ICD-10-CM | POA: Insufficient documentation

## 2015-06-14 DIAGNOSIS — F1910 Other psychoactive substance abuse, uncomplicated: Secondary | ICD-10-CM

## 2015-06-14 DIAGNOSIS — M1990 Unspecified osteoarthritis, unspecified site: Secondary | ICD-10-CM | POA: Insufficient documentation

## 2015-06-14 DIAGNOSIS — F1424 Cocaine dependence with cocaine-induced mood disorder: Secondary | ICD-10-CM | POA: Diagnosis present

## 2015-06-14 LAB — CBC
HCT: 31.5 % — ABNORMAL LOW (ref 36.0–46.0)
Hemoglobin: 10 g/dL — ABNORMAL LOW (ref 12.0–15.0)
MCH: 25.8 pg — ABNORMAL LOW (ref 26.0–34.0)
MCHC: 31.7 g/dL (ref 30.0–36.0)
MCV: 81.2 fL (ref 78.0–100.0)
Platelets: 259 10*3/uL (ref 150–400)
RBC: 3.88 MIL/uL (ref 3.87–5.11)
RDW: 17.8 % — ABNORMAL HIGH (ref 11.5–15.5)
WBC: 8.8 10*3/uL (ref 4.0–10.5)

## 2015-06-14 LAB — COMPREHENSIVE METABOLIC PANEL
ALK PHOS: 97 U/L (ref 38–126)
ALT: 16 U/L (ref 14–54)
ANION GAP: 7 (ref 5–15)
AST: 14 U/L — ABNORMAL LOW (ref 15–41)
Albumin: 3.6 g/dL (ref 3.5–5.0)
BUN: 19 mg/dL (ref 6–20)
CO2: 24 mmol/L (ref 22–32)
Calcium: 8.9 mg/dL (ref 8.9–10.3)
Chloride: 106 mmol/L (ref 101–111)
Creatinine, Ser: 0.7 mg/dL (ref 0.44–1.00)
GFR calc non Af Amer: >60 mL/min (ref >60)
GLUCOSE: 355 mg/dL — AB (ref 65–99)
POTASSIUM: 4.1 mmol/L (ref 3.5–5.1)
SODIUM: 137 mmol/L (ref 135–145)
TOTAL PROTEIN: 7.2 g/dL (ref 6.5–8.1)
Total Bilirubin: 0.6 mg/dL (ref 0.3–1.2)

## 2015-06-14 LAB — ETHANOL: Alcohol, Ethyl (B): <5 mg/dL (ref <5)

## 2015-06-14 LAB — RAPID URINE DRUG SCREEN, HOSP PERFORMED
AMPHETAMINES: NOT DETECTED
BENZODIAZEPINES: NOT DETECTED
Barbiturates: NOT DETECTED
COCAINE: POSITIVE — AB
Opiates: NOT DETECTED
Tetrahydrocannabinol: NOT DETECTED

## 2015-06-14 LAB — ACETAMINOPHEN LEVEL: Acetaminophen (Tylenol), Serum: <10 ug/mL — ABNORMAL LOW (ref 10–30)

## 2015-06-14 LAB — SALICYLATE LEVEL: Salicylate Lvl: <4 mg/dL (ref 2.8–30.0)

## 2015-06-14 LAB — CBG MONITORING, ED
GLUCOSE-CAPILLARY: 317 mg/dL — AB (ref 65–99)
Glucose-Capillary: 281 mg/dL — ABNORMAL HIGH (ref 65–99)

## 2015-06-14 MED ORDER — ZOLPIDEM TARTRATE 5 MG PO TABS
5.0000 mg | ORAL_TABLET | Freq: Every evening | ORAL | Status: DC | PRN
  Administered 2015-06-14: 5 mg via ORAL
  Filled 2015-06-14: qty 1

## 2015-06-14 MED ORDER — THIAMINE HCL 100 MG/ML IJ SOLN
100.0000 mg | Freq: Every day | INTRAMUSCULAR | Status: DC
  Filled 2015-06-14: qty 2

## 2015-06-14 MED ORDER — NICOTINE 21 MG/24HR TD PT24
21.0000 mg | MEDICATED_PATCH | Freq: Every day | TRANSDERMAL | Status: DC
  Administered 2015-06-14 – 2015-06-15 (×2): 21 mg via TRANSDERMAL
  Filled 2015-06-14 (×2): qty 1

## 2015-06-14 MED ORDER — ACETAMINOPHEN 325 MG PO TABS: 650.0000 mg | ORAL_TABLET | ORAL | Status: DC | PRN

## 2015-06-14 MED ORDER — TRAZODONE HCL 100 MG PO TABS
100.0000 mg | ORAL_TABLET | Freq: Every day | ORAL | Status: DC
  Administered 2015-06-14: 100 mg via ORAL
  Filled 2015-06-14: qty 1

## 2015-06-14 MED ORDER — INSULIN GLARGINE 100 UNIT/ML ~~LOC~~ SOLN
25.0000 [IU] | Freq: Every day | SUBCUTANEOUS | Status: DC
  Administered 2015-06-14: 25 [IU] via SUBCUTANEOUS
  Filled 2015-06-14 (×2): qty 0.25

## 2015-06-14 MED ORDER — ALUM & MAG HYDROXIDE-SIMETH 200-200-20 MG/5ML PO SUSP: 30.0000 mL | ORAL | Status: DC | PRN

## 2015-06-14 MED ORDER — LORAZEPAM 1 MG PO TABS: 0.0000 mg | ORAL_TABLET | Freq: Four times a day (QID) | ORAL | Status: DC

## 2015-06-14 MED ORDER — LORAZEPAM 1 MG PO TABS
1.0000 mg | ORAL_TABLET | Freq: Three times a day (TID) | ORAL | Status: DC | PRN
  Administered 2015-06-14: 1 mg via ORAL
  Filled 2015-06-14: qty 1

## 2015-06-14 MED ORDER — RISPERIDONE 2 MG PO TABS
2.0000 mg | ORAL_TABLET | Freq: Every day | ORAL | Status: DC
  Administered 2015-06-14 – 2015-06-15 (×2): 2 mg via ORAL
  Filled 2015-06-14 (×2): qty 1

## 2015-06-14 MED ORDER — INSULIN ASPART 100 UNIT/ML ~~LOC~~ SOLN: 4.0000 [IU] | Freq: Every day | SUBCUTANEOUS | Status: DC

## 2015-06-14 MED ORDER — HALOPERIDOL 5 MG PO TABS
5.0000 mg | ORAL_TABLET | Freq: Every day | ORAL | Status: DC
  Administered 2015-06-14: 5 mg via ORAL
  Filled 2015-06-14: qty 1

## 2015-06-14 MED ORDER — LISINOPRIL 10 MG PO TABS
10.0000 mg | ORAL_TABLET | Freq: Every day | ORAL | Status: DC
  Administered 2015-06-14 – 2015-06-15 (×2): 10 mg via ORAL
  Filled 2015-06-14 (×2): qty 1

## 2015-06-14 MED ORDER — LORAZEPAM 1 MG PO TABS: 0.0000 mg | ORAL_TABLET | Freq: Two times a day (BID) | ORAL | Status: DC

## 2015-06-14 MED ORDER — PANTOPRAZOLE SODIUM 40 MG PO TBEC
40.0000 mg | DELAYED_RELEASE_TABLET | Freq: Every day | ORAL | Status: DC
  Administered 2015-06-14 – 2015-06-15 (×2): 40 mg via ORAL
  Filled 2015-06-14 (×2): qty 1

## 2015-06-14 MED ORDER — METFORMIN HCL 500 MG PO TABS
1000.0000 mg | ORAL_TABLET | Freq: Two times a day (BID) | ORAL | Status: DC
  Administered 2015-06-15: 1000 mg via ORAL
  Filled 2015-06-14 (×3): qty 2

## 2015-06-14 MED ORDER — BENZTROPINE MESYLATE 1 MG PO TABS
1.0000 mg | ORAL_TABLET | Freq: Every day | ORAL | Status: DC
  Administered 2015-06-14: 1 mg via ORAL
  Filled 2015-06-14: qty 1

## 2015-06-14 MED ORDER — SIMVASTATIN 40 MG PO TABS
40.0000 mg | ORAL_TABLET | Freq: Every evening | ORAL | Status: DC
  Administered 2015-06-14: 40 mg via ORAL
  Filled 2015-06-14 (×2): qty 1

## 2015-06-14 MED ORDER — CITALOPRAM HYDROBROMIDE 40 MG PO TABS
40.0000 mg | ORAL_TABLET | Freq: Every day | ORAL | Status: DC
  Administered 2015-06-14 – 2015-06-15 (×2): 40 mg via ORAL
  Filled 2015-06-14 (×2): qty 1

## 2015-06-14 MED ORDER — ONDANSETRON HCL 4 MG PO TABS: 4.0000 mg | ORAL_TABLET | Freq: Three times a day (TID) | ORAL | Status: DC | PRN

## 2015-06-14 MED ORDER — VITAMIN B-1 100 MG PO TABS
100.0000 mg | ORAL_TABLET | Freq: Every day | ORAL | Status: DC
  Administered 2015-06-14 – 2015-06-15 (×2): 100 mg via ORAL
  Filled 2015-06-14 (×2): qty 1

## 2015-06-14 NOTE — ED Notes (Signed)
Pt states she wants detox from crack and etoh.  Normally drinks appx 18 pack per day.  Last drink was last night.  Last crack was yesterday.  Suicidal with plan to cut wrists.  No HI.

## 2015-06-14 NOTE — ED Provider Notes (Signed)
CSN: [id number]     Arrival date & time 06/14/15  1751 History   First MD Initiated Contact with Patient 06/14/15 1911     Chief Complaint  Patient presents with  . Detox from etoh & crack   . Suicidal     (Consider location/radiation/quality/duration/timing/severity/associated sxs/prior Treatment) The history is provided by the patient and medical records.    This is a 54 year old female with history of hypertension, diabetes, asthma, arthritis, bipolar disorder, substance abuse, presenting to the ED for detox and suicidal ideation.  Patient states she has been using and abusing alcohol and cocaine consistently for the past several weeks.  She drinks approx 18 beers daily, uses cocaine 2-3 times weekly.  Last drink and use was last night.  She states she has had thoughts of harming herself by cutting her wrist.  She also reports she had some auditory hallucinations a few days ago, none currently.  Denies visual hallucinations.  Denies homicidal ideation.  Patient is known diabetic, states she has not taken her insulin in a few days.  VSS.  Past Medical History  Diagnosis Date  . Diabetes mellitus without complication   . Hypertension   . Asthma   . Arthritis   . Bipolar 1 disorder, depressed   . Substance abuse    Past Surgical History  Procedure Laterality Date  . C section     No family history on file. History  Substance Use Topics  . Smoking status: Current Some Day Smoker -- 1.00 packs/day    Types: Cigarettes  . Smokeless tobacco: Never Used  . Alcohol Use: Yes     Comment: three to four 40oz beers daily   OB History    No data available     Review of Systems  Psychiatric/Behavioral: Positive for suicidal ideas.  All other systems reviewed and are negative.     Allergies  Aspirin  Home Medications   Prior to Admission medications   Medication Sig Start Date End Date Taking? Authorizing Provider  benztropine (COGENTIN) 1 MG tablet Take 1 tablet (1 mg  total) by mouth at bedtime. 05/25/15  Yes Adonis BrookSheila Agustin, NP  citalopram (CELEXA) 40 MG tablet Take 1 tablet (40 mg total) by mouth daily. 05/25/15  Yes Adonis BrookSheila Agustin, NP  haloperidol (HALDOL) 5 MG tablet Take 1 tablet (5 mg total) by mouth at bedtime. 05/25/15  Yes Adonis BrookSheila Agustin, NP  insulin aspart (NOVOLOG) 100 UNIT/ML injection Inject 4-15 Units into the skin daily. Diabetes management 05/25/15  Yes Adonis BrookSheila Agustin, NP  insulin glargine (LANTUS) 100 UNIT/ML injection Inject 0.25 mLs (25 Units total) into the skin at bedtime. 05/25/15  Yes Adonis BrookSheila Agustin, NP  lisinopril (PRINIVIL,ZESTRIL) 10 MG tablet Take 1 tablet (10 mg total) by mouth daily. 05/25/15  Yes Adonis BrookSheila Agustin, NP  metFORMIN (GLUCOPHAGE) 1000 MG tablet Take 1 tablet (1,000 mg total) by mouth 2 (two) times daily with a meal. 05/25/15  Yes Adonis BrookSheila Agustin, NP  simvastatin (ZOCOR) 40 MG tablet Take 1 tablet (40 mg total) by mouth every evening. 05/25/15  Yes Adonis BrookSheila Agustin, NP  traZODone (DESYREL) 100 MG tablet Take 1 tablet (100 mg total) by mouth at bedtime. 05/25/15  Yes Adonis BrookSheila Agustin, NP  amoxicillin (AMOXIL) 500 MG capsule Take 1 capsule (500 mg total) by mouth 3 (three) times daily. Patient not taking: Reported on 06/14/2015 05/25/15   Adonis BrookSheila Agustin, NP  nicotine (NICODERM CQ - DOSED IN MG/24 HOURS) 21 mg/24hr patch Place 1 patch (21 mg total) onto the skin  daily at 6 (six) AM. Patient not taking: Reported on 06/14/2015 05/25/15   Adonis Brook, NP  pantoprazole (PROTONIX) 40 MG tablet Take 1 tablet (40 mg total) by mouth daily. Patient not taking: Reported on 06/14/2015 05/25/15   Adonis Brook, NP   BP 154/104 mmHg  Pulse 86  Temp(Src) 97.9 F (36.6 C) (Oral)  Resp 18  SpO2 100%  LMP 06/30/2013   Physical Exam  Constitutional: She is oriented to person, place, and time. She appears well-developed and well-nourished. No distress.  HENT:  Head: Normocephalic and atraumatic.  Mouth/Throat: Oropharynx is clear and moist.  Eyes:  Conjunctivae and EOM are normal. Pupils are equal, round, and reactive to light.  Neck: Normal range of motion. Neck supple.  Cardiovascular: Normal rate, regular rhythm and normal heart sounds.   Pulmonary/Chest: Effort normal and breath sounds normal. No respiratory distress. She has no wheezes.  Abdominal: Soft. Bowel sounds are normal. There is no tenderness. There is no guarding.  Musculoskeletal: Normal range of motion. She exhibits no edema.  Neurological: She is alert and oriented to person, place, and time.  Skin: Skin is warm and dry. She is not diaphoretic.  Psychiatric: She has a normal mood and affect. She is not actively hallucinating. She expresses suicidal ideation. She expresses suicidal plans. She expresses no homicidal plans.  SI with plan to slit wrist; denies HI or AVH  Nursing note and vitals reviewed.   ED Course  Procedures (including critical care time) Labs Review Labs Reviewed  COMPREHENSIVE METABOLIC PANEL - Abnormal; Notable for the following:    Glucose, Bld 355 (*)    AST 14 (*)    All other components within normal limits  ACETAMINOPHEN LEVEL - Abnormal; Notable for the following:    Acetaminophen (Tylenol), Serum <10 (*)    All other components within normal limits  CBC - Abnormal; Notable for the following:    Hemoglobin 10.0 (*)    HCT 31.5 (*)    MCH 25.8 (*)    RDW 17.8 (*)    All other components within normal limits  URINE RAPID DRUG SCREEN, HOSP PERFORMED - Abnormal; Notable for the following:    Cocaine POSITIVE (*)    All other components within normal limits  CBG MONITORING, ED - Abnormal; Notable for the following:    Glucose-Capillary 317 (*)    All other components within normal limits  CBG MONITORING, ED - Abnormal; Notable for the following:    Glucose-Capillary 281 (*)    All other components within normal limits  ETHANOL  SALICYLATE LEVEL    Imaging Review No results found.   EKG Interpretation None      MDM    Final diagnoses:  Substance abuse  Suicidal ideation   54 year old female here for substance abuse and suicidal ideation. She has been abusing alcohol and cocaine. She reports suicidal ideation with plan to slit her wrists. She denies any homicidal ideation. She does report some auditory hallucinations previously, none currently.  Patient has no physical complaints at this time. Labwork as above, glucose is elevated at 355, however anion gap remains normal at 7.  Clinically not DKA.  Patient medically cleared and awaiting TTS evaluation.  Temp holding orders and home meds ordered for patient.  She will be placed on CIWA protocol given her ongoing alcohol use.    TTS has evaluated patient-- IP treatment recommended, will seek placement.  Garlon Hatchet, PA-C 06/14/15 1610  Alvira Monday, MD 06/15/15 1351

## 2015-06-14 NOTE — ED Notes (Signed)
Pt being evaluated by TTS. 

## 2015-06-14 NOTE — ED Notes (Signed)
Pt came put to triage nurse's desk to use phone

## 2015-06-14 NOTE — BH Assessment (Signed)
Tele Assessment Note   Diana FridayCarrie Santos is a 54 y.o. female who [facility]/SA and depression. Pt told this Clinical research associatewriter that she has been SI x1week with a plan to (1) jump in front a car or (2) cut her wrists, however, pt no longer endorses SI and states she wants detox from alcohol and crack cocaine.  Pt told this writer that she's 1 previous SI attempt by cutting her wrists years ago and says she is shadows.  Pt denies aud halluc/HI.  Pt states she drinks and 18 pk of beer,d aily and last drink was yesterday, she had 15-24oz & 12oz beers.  Pt also uses $50 worth of crack cocaine at least 4 days a week and her last use was yesterday, she used $50 worth of crack cocaine.  Pt says she suffers from blackouts only due to her drug use, no seizures and is currently w/d: tremors dry mouth and blurred vision.  Pt is a diabetic and CBG registered at 355 @1846 .  Appropriate medication protocol is being followed by emerg dept.  Pt denies legal issues at this time.  Pt UDS + for cocaine and BAL shows <5.  Per Donell SievertSpencer Simon, PA, she meets criteria for inpt admission and must have 2 consecutive CBG draws <250 [facility]    Axis I: Major depressive disorder, Recurrent episode, With psychotic features;Alcohol use disorder, Severe;Cocaine use disorder, Severe   Axis II: Deferred Axis III:  Past Medical History  Diagnosis Date  . Diabetes mellitus without complication   . Hypertension   . Asthma   . Arthritis   . Bipolar 1 disorder, depressed   . Substance abuse    Axis IV: other psychosocial or environmental problems, problems related to social environment and problems with primary support group Axis V: 31-40 impairment in reality testing  Past Medical History:  Past Medical History  Diagnosis Date  . Diabetes mellitus without complication   . Hypertension   . Asthma   . Arthritis   . Bipolar 1 disorder, depressed   . Substance abuse     Past Surgical  History  Procedure Laterality Date  . C section      Family History: No family history on file.  Social History:  reports that she has been smoking Cigarettes.  She has been smoking about 1.00 pack per day. She has never used smokeless tobacco. She reports that she drinks alcohol. She reports that she uses illicit drugs (Cocaine, Marijuana, and "Crack" cocaine).  Additional Social History:  Alcohol / Drug Use Pain Medications: See MAR  Prescriptions: See MAR  Over the Counter: See MAR  History of alcohol / drug use?: Yes Longest period of sobriety (when/how long): Only when in detox  Negative Consequences of Use: Work / Programmer, multimediachool, Copywriter, advertisingersonal relationships, Surveyor, quantityinancial Withdrawal Symptoms: Tremors, Other (Comment) (Blurred vision, dry mouth ) Substance #1 Name of Substance 1: Alcohol  1 - Age of First Use: 7 YOF  1 - Amount (size/oz): 18 Pk  1 - Frequency: Daily  1 - Duration: On-going  1 - Last Use / Amount: 06/13/15 Substance #2 Name of Substance 2: Crack Cocaine  2 - Age of First Use: 35 YOF  2 - Amount (size/oz): $50 2 - Frequency: 4 Days wkly  2 - Duration: On-going  2 - Last Use / Amount: 06/13/15  CIWA: CIWA-Ar BP: (!) 154/104 mmHg Pulse Rate: 86 COWS:    PATIENT STRENGTHS: (choose at least two) Communication skills  Motivation for treatment/growth  Allergies:  Allergies  Allergen Reactions  . Aspirin Anaphylaxis and Swelling    Home Medications:  (Not in a hospital admission)  OB/GYN Status:  Patient's last menstrual period was 06/30/2013.  General Assessment Data Location of Assessment: [facility] ED TTS Assessment: In system Is this a Tele or Face-to-Face Assessment?: Tele Assessment Is this an Initial Assessment or a Re-assessment for this encounter?: Initial Assessment Marital status: Divorced [place] name: None  Is patient pregnant?: No Pregnancy Status: No Living Arrangements: Alone Can pt return to current living arrangement?: Yes Admission Status:  Voluntary Is patient capable of signing voluntary admission?: Yes Referral Source: MD Insurance type: SP   Medical Screening Exam [facility] Walk-in ONLY) Medical Exam completed: No Reason for MSE not completed: Other: (None )  Crisis Care Plan Living Arrangements: Alone Name of Psychiatrist: [facility] Name of Therapist: None   Education Status Is patient currently in school?: No Current Grade: None  Highest grade of school patient has completed: 12 Name of school: None  Contact person: None   Risk to self with the past 6 months Suicidal Ideation: No-Not Currently/Within Last 6 Months Has patient been a risk to self within the past 6 months prior to admission? : No Suicidal Intent: No-Not Currently/Within Last 6 Months Has patient had any suicidal intent within the past 6 months prior to admission? : No Is patient at risk for suicide?: Yes Suicidal Plan?: No-Not Currently/Within Last 6 Months Has patient had any suicidal plan within the past 6 months prior to admission? : Yes Access to Means: Yes Specify Access to Suicidal Means: Sharps, Pills  What has been your use of drugs/alcohol within the last 12 months?: Abusing: alcohol, crack/cocaine  Previous Attempts/Gestures: Yes How many times?: 1 Other Self Harm Risks: None  Triggers for Past Attempts: Unpredictable Intentional Self Injurious Behavior: None Family Suicide History: No Recent stressful life event(s): Other (Comment) (Chronic SA ) Persecutory voices/beliefs?: No Depression: Yes Depression Symptoms: Loss of interest in usual pleasures Substance abuse history and/or treatment for substance abuse?: Yes Suicide prevention information given to non-admitted patients: Not applicable  Risk to Others within the past 6 months Homicidal Ideation: No Does patient have any lifetime risk of violence toward others beyond the six months prior to admission? : No Thoughts of Harm to Others: No Current Homicidal Intent:  No Current Homicidal Plan: No Access to Homicidal Means: No Identified Victim: None  History of harm to others?: No Assessment of Violence: None Noted Violent Behavior Description: None  Does patient have access to weapons?: No Criminal Charges Pending?: No Does patient have a court date: No Is patient on probation?: No  Psychosis Hallucinations: Visual ("Shadows") Delusions: None noted  Mental Status Report Appearance/Hygiene: Disheveled, In scrubs Eye Contact: Poor Motor Activity: Unremarkable Speech: Logical/coherent, Slurred Level of Consciousness: Alert Mood: Depressed Affect: Appropriate to circumstance, Depressed Anxiety Level: None Thought Processes: Coherent, Relevant Judgement: Unimpaired Orientation: Person, Place, Time, Situation Obsessive Compulsive Thoughts/Behaviors: None  Cognitive Functioning Concentration: Decreased Memory: Recent Intact, Remote Intact IQ: Average Insight: Fair Impulse Control: Fair Appetite: Fair Weight Loss: 0 Weight Gain: 0 Sleep: No Change Total Hours of Sleep: 4 Vegetative Symptoms: None  ADLScreening [facility] Assessment Services) Patient's cognitive ability adequate to safely complete daily activities?: Yes Patient able to express need for assistance with ADLs?: Yes Independently performs ADLs?: Yes (appropriate for developmental age)  Prior Inpatient Therapy Prior Inpatient Therapy: Yes Prior Therapy Dates: 2014,2016 Prior Therapy Facilty/Provider(s): [facility], [facility] Reason  for Treatment: SI/Depression/SA   Prior Outpatient Therapy Prior Outpatient Therapy: No Prior Therapy Dates: None  Prior Therapy Facilty/Provider(s): None  Reason for Treatment: None  Does patient have an ACCT team?: No Does patient have Intensive In-House Services?  : No Does patient have [facility] services? : No Does patient have P4CC services?: No  ADL Screening (condition at time of admission) Patient's cognitive ability adequate to safely complete  daily activities?: Yes Is the patient deaf or have difficulty hearing?: No Does the patient have difficulty seeing, even when wearing glasses/contacts?: No Does the patient have difficulty concentrating, remembering, or making decisions?: No Patient able to express need for assistance with ADLs?: Yes Does the patient have difficulty dressing or bathing?: No Independently performs ADLs?: Yes (appropriate for developmental age) Does the patient have difficulty walking or climbing stairs?: No Weakness of Legs: None Weakness of Arms/Hands: None  Home Assistive Devices/Equipment Home Assistive Devices/Equipment: None  Therapy Consults (therapy consults require a physician order) PT Evaluation Needed: No OT Evalulation Needed: No SLP Evaluation Needed: No Abuse/Neglect Assessment (Assessment to be complete while patient is alone) Physical Abuse: Denies Verbal Abuse: Denies Sexual Abuse: Denies Exploitation of patient/patient's resources: Denies Self-Neglect: Denies Values / Beliefs Cultural Requests During Hospitalization: None Consults Spiritual Care Consult Needed: No Social Work Consult Needed: No Merchant navy officer (For Healthcare) Does patient have an advance directive?: No Would patient like information on creating an advanced directive?: No - patient declined information Nutrition Screen- [facility] Patient's home diet: Regular Has the patient recently lost weight without trying?: No Has the patient been eating poorly because of a decreased appetite?: No Malnutrition Screening Tool Score: 0  Additional Information 1:1 In Past 12 Months?: No CIRT Risk: No Elopement Risk: No Does patient have medical clearance?: Yes     Disposition:  Disposition Initial Assessment Completed for this Encounter: Yes Disposition of Patient: Inpatient treatment program, Referred to (Per Donell Sievert, PA recommends inpt admission ) Type of inpatient treatment program: Adult Patient  referred to: Other (Comment) (Per Donell Sievert, PA recommends inpt admission )  Murrell Redden 06/14/2015 8:27 PM

## 2015-06-14 NOTE — ED Notes (Signed)
Gave pt paper scrubs to get changed into

## 2015-06-15 ENCOUNTER — Inpatient Hospital Stay (HOSPITAL_COMMUNITY)
Admission: EM | Admit: 2015-06-15 | Payer: Federal, State, Local not specified - Other | Source: Intra-hospital | Admitting: Psychiatry

## 2015-06-15 DIAGNOSIS — F1424 Cocaine dependence with cocaine-induced mood disorder: Secondary | ICD-10-CM

## 2015-06-15 DIAGNOSIS — R45851 Suicidal ideations: Secondary | ICD-10-CM

## 2015-06-15 DIAGNOSIS — F1010 Alcohol abuse, uncomplicated: Secondary | ICD-10-CM

## 2015-06-15 LAB — CBG MONITORING, ED
GLUCOSE-CAPILLARY: 282 mg/dL — AB (ref 65–99)
GLUCOSE-CAPILLARY: 96 mg/dL (ref 65–99)
Glucose-Capillary: 160 mg/dL — ABNORMAL HIGH (ref 65–99)
Glucose-Capillary: 98 mg/dL (ref 65–99)

## 2015-06-15 MED ORDER — INSULIN ASPART 100 UNIT/ML ~~LOC~~ SOLN
0.0000 [IU] | SUBCUTANEOUS | Status: DC
  Administered 2015-06-15: 2 [IU] via SUBCUTANEOUS

## 2015-06-15 MED ORDER — INSULIN ASPART 100 UNIT/ML ~~LOC~~ SOLN
4.0000 [IU] | Freq: Once | SUBCUTANEOUS | Status: AC
  Administered 2015-06-15: 4 [IU] via SUBCUTANEOUS
  Filled 2015-06-15: qty 1

## 2015-06-15 MED ORDER — INSULIN ASPART PROT & ASPART (70-30 MIX) 100 UNIT/ML ~~LOC~~ SUSP: 4.0000 [IU] | Freq: Once | SUBCUTANEOUS | Status: DC

## 2015-06-15 NOTE — Consult Note (Signed)
[facility] Consult   Reason for Consult:  Cocaine abuse with suicidal ideations Referring Physician:  EDP Patient Identification: Diana Santos MRN:  [id number] Principal Diagnosis: Cocaine dependence with cocaine-induced mood disorder Diagnosis:   Patient Active Problem List   Diagnosis Date Noted  . Cocaine dependence with cocaine-induced mood disorder [F14.24]     Priority: High  . Alcohol dependence [F10.20] 08/26/2013    Priority: High  . Cocaine dependence [F14.20] 08/26/2013    Priority: High  . Severe major depression with psychotic features [F32.3] 08/26/2013    Priority: High  . Severe recurrent major depressive disorder with psychotic features [F33.3]   . Substance abuse [F19.10]   . Alcohol use disorder, severe, dependence [F10.20] 02/25/2015  . Diabetes [E11.9] 04/08/2014  . Major depression [F32.2] 04/07/2014    Total Time spent with patient: 45 minutes  Subjective:   Diana Santos is a 54 y.o. female patient reports cocaine and alcohol abuse.  HPI:  54 y.o. female who voluntarily presents to [facility] with SI/SA and depression. Pt told this Probation officer that she has been SI x1week with a plan to (1) jump in front a car or (2) cut her wrists, however, pt no longer endorses SI and states she wants detox from alcohol and crack cocaine. Pt told this writer that she's 1 previous SI attempt by cutting her wrists years ago and says she is shadows. Pt denies aud halluc/HI. Pt states she drinks and 18 pk of beer,d aily and last drink was yesterday, she had 15-24oz & 12oz beers. Pt also uses $50 worth of crack cocaine at least 4 days a week and her last use was yesterday, she used $50 worth of crack cocaine. Pt says she suffers from blackouts only due to her drug use, no seizures and is currently w/d: tremors dry mouth and blurred vision. Pt is a diabetic and CBG registered at 355 _0 . Appropriate medication protocol is being followed by emerg dept. Pt denies  legal issues at this time. Pt UDS + for cocaine and BAL shows <5. Today:  Patient denies suicidal/homicidal ideations, hallucinations.  She reported drinking 18 beers daily but negative on admission with no withdrawal symptoms noted. She does state she was using cocaine and is "sleepy" today.  Stable for discharge.  HPI Elements:   Location:  generalized. Quality:  acute. Severity:  mild. Timing:  intermittent. Duration:  brief. Context:  cocaine abuse.  Past Medical History:  Past Medical History  Diagnosis Date  . Diabetes mellitus without complication   . Hypertension   . Asthma   . Arthritis   . Bipolar 1 disorder, depressed   . Substance abuse     Past Surgical History  Procedure Laterality Date  . C section     Family History: No family history on file. Social History:  History  Alcohol Use  . Yes    Comment: three to four 40oz beers daily     History  Drug Use  . Yes  . Special: Cocaine, Marijuana, "Crack" cocaine    Comment: crack cocaine daily    History   Social History  . Marital Status: Single    Spouse Name: N/A  . Number of Children: N/A  . Years of Education: N/A   Social History Main Topics  . Smoking status: Current Some Day Smoker -- 1.00 packs/day    Types: Cigarettes  . Smokeless tobacco: Never Used  . Alcohol Use: Yes     Comment: three to four 40oz beers  daily  . Drug Use: Yes    Special: Cocaine, Marijuana, "Crack" cocaine     Comment: crack cocaine daily  . Sexual Activity: Not Currently   Other Topics Concern  . None   Social History Narrative   Additional Social History:    Pain Medications: See MAR  Prescriptions: See MAR  Over the Counter: See MAR  History of alcohol / drug use?: Yes Longest period of sobriety (when/how long): Only when in detox  Negative Consequences of Use: Work / Youth worker, Charity fundraiser relationships, Museum/gallery curator Withdrawal Symptoms: Tremors, Other (Comment) (Blurred vision, dry mouth ) Name of Substance 1:  Alcohol  1 - Age of First Use: 7 YOF  1 - Amount (size/oz): 18 Pk  1 - Frequency: Daily  1 - Duration: On-going  1 - Last Use / Amount: 06/13/15 Name of Substance 2: Crack Cocaine  2 - Age of First Use: 35 YOF  2 - Amount (size/oz): $50 2 - Frequency: 4 Days wkly  2 - Duration: On-going  2 - Last Use / Amount: 06/13/15                 Allergies:   Allergies  Allergen Reactions  . Aspirin Anaphylaxis and Swelling    Labs:  Results for orders placed or performed during the hospital encounter of 06/14/15 (from the past 48 hour(s))  CBG monitoring, ED     Status: Abnormal   Collection Time: 06/14/15  6:00 PM  Result Value Ref Range   Glucose-Capillary 317 (H) 65 - 99 mg/dL  Urine rapid drug screen (hosp performed) (Not at [facility])     Status: Abnormal   Collection Time: 06/14/15  6:23 PM  Result Value Ref Range   Opiates NONE DETECTED NONE DETECTED   Cocaine POSITIVE (A) NONE DETECTED   Benzodiazepines NONE DETECTED NONE DETECTED   Amphetamines NONE DETECTED NONE DETECTED   Tetrahydrocannabinol NONE DETECTED NONE DETECTED   Barbiturates NONE DETECTED NONE DETECTED    Comment:        DRUG SCREEN FOR MEDICAL PURPOSES ONLY.  IF CONFIRMATION IS NEEDED FOR ANY PURPOSE, NOTIFY LAB WITHIN 5 DAYS.        LOWEST DETECTABLE LIMITS FOR URINE DRUG SCREEN Drug Class       Cutoff (ng/mL) Amphetamine      1000 Barbiturate      200 Benzodiazepine   322 Tricyclics       025 Opiates          300 Cocaine          300 THC              50   Comprehensive metabolic panel     Status: Abnormal   Collection Time: 06/14/15  6:46 PM  Result Value Ref Range   Sodium 137 135 - 145 mmol/L   Potassium 4.1 3.5 - 5.1 mmol/L   Chloride 106 101 - 111 mmol/L   CO2 24 22 - 32 mmol/L   Glucose, Bld 355 (H) 65 - 99 mg/dL   BUN 19 6 - 20 mg/dL   Creatinine, Ser 0.70 0.44 - 1.00 mg/dL   Calcium 8.9 8.9 - 10.3 mg/dL   Total Protein 7.2 6.5 - 8.1 g/dL   Albumin 3.6 3.5 - 5.0 g/dL   AST 14 (L) 15  - 41 U/L   ALT 16 14 - 54 U/L   Alkaline Phosphatase 97 38 - 126 U/L   Total Bilirubin 0.6 0.3 - 1.2 mg/dL   GFR  calc non Af Amer >60 >60 mL/min   GFR calc Af Amer >60 >60 mL/min    Comment: (NOTE) The eGFR has been calculated using the CKD EPI equation. This calculation has not been validated in all clinical situations. eGFR's persistently <60 mL/min signify possible Chronic Kidney Disease.    Anion gap 7 5 - 15  Ethanol (ETOH)     Status: None   Collection Time: 06/14/15  6:46 PM  Result Value Ref Range   Alcohol, Ethyl (B) <5 <5 mg/dL    Comment:        LOWEST DETECTABLE LIMIT FOR SERUM ALCOHOL IS 5 mg/dL FOR MEDICAL PURPOSES ONLY   Salicylate level     Status: None   Collection Time: 06/14/15  6:46 PM  Result Value Ref Range   Salicylate Lvl <6.5 2.8 - 30.0 mg/dL  Acetaminophen level     Status: Abnormal   Collection Time: 06/14/15  6:46 PM  Result Value Ref Range   Acetaminophen (Tylenol), Serum <10 (L) 10 - 30 ug/mL    Comment:        THERAPEUTIC CONCENTRATIONS VARY SIGNIFICANTLY. A RANGE OF 10-30 ug/mL MAY BE AN EFFECTIVE CONCENTRATION FOR MANY PATIENTS. HOWEVER, SOME ARE BEST TREATED AT CONCENTRATIONS OUTSIDE THIS RANGE. ACETAMINOPHEN CONCENTRATIONS >150 ug/mL AT 4 HOURS AFTER INGESTION AND >50 ug/mL AT 12 HOURS AFTER INGESTION ARE OFTEN ASSOCIATED WITH TOXIC REACTIONS.   CBC     Status: Abnormal   Collection Time: 06/14/15  6:46 PM  Result Value Ref Range   WBC 8.8 4.0 - 10.5 K/uL   RBC 3.88 3.87 - 5.11 MIL/uL   Hemoglobin 10.0 (L) 12.0 - 15.0 g/dL   HCT 31.5 (L) 36.0 - 46.0 %   MCV 81.2 78.0 - 100.0 fL   MCH 25.8 (L) 26.0 - 34.0 pg   MCHC 31.7 30.0 - 36.0 g/dL   RDW 17.8 (H) 11.5 - 15.5 %   Platelets 259 150 - 400 K/uL  CBG monitoring, ED     Status: Abnormal   Collection Time: 06/14/15  9:53 PM  Result Value Ref Range   Glucose-Capillary 281 (H) 65 - 99 mg/dL  CBG monitoring, ED     Status: Abnormal   Collection Time: 06/15/15 12:20 AM  Result  Value Ref Range   Glucose-Capillary 282 (H) 65 - 99 mg/dL   Comment 1 Document in Chart    Comment 2 Repeat Test   CBG monitoring, ED     Status: Abnormal   Collection Time: 06/15/15  5:32 AM  Result Value Ref Range   Glucose-Capillary 160 (H) 65 - 99 mg/dL  CBG monitoring, ED     Status: None   Collection Time: 06/15/15  9:36 AM  Result Value Ref Range   Glucose-Capillary 98 65 - 99 mg/dL    Vitals: Blood pressure 159/82, pulse 68, temperature 98.5 F (36.9 C), temperature source Oral, resp. rate 18, last menstrual period 06/30/2013, SpO2 99 %.  Risk to Self: Suicidal Ideation: No-Not Currently/Within Last 6 Months Suicidal Intent: No-Not Currently/Within Last 6 Months Is patient at risk for suicide?: Yes Suicidal Plan?: No-Not Currently/Within Last 6 Months Access to Means: Yes Specify Access to Suicidal Means: Sharps, Pills  What has been your use of drugs/alcohol within the last 12 months?: Abusing: alcohol, crack/cocaine  How many times?: 1 Other Self Harm Risks: None  Triggers for Past Attempts: Unpredictable Intentional Self Injurious Behavior: None Risk to Others: Homicidal Ideation: No Thoughts of Harm to Others: No Current Homicidal  Intent: No Current Homicidal Plan: No Access to Homicidal Means: No Identified Victim: None  History of harm to others?: No Assessment of Violence: None Noted Violent Behavior Description: None  Does patient have access to weapons?: No Criminal Charges Pending?: No Does patient have a court date: No Prior Inpatient Therapy: Prior Inpatient Therapy: Yes Prior Therapy Dates: 2014,2016 Prior Therapy Facilty/Provider(s): [facility], [facility] Reason for Treatment: SI/Depression/SA  Prior Outpatient Therapy: Prior Outpatient Therapy: No Prior Therapy Dates: None  Prior Therapy Facilty/Provider(s): None  Reason for Treatment: None  Does patient have an ACCT team?: No Does patient have Intensive In-House Services?  : No Does patient have [facility]  services? : No Does patient have P4CC services?: No  Current Facility-Administered Medications  Medication Dose Route Frequency Provider Last Rate Last Dose  . acetaminophen (TYLENOL) tablet 650 mg  650 mg Oral Q4H PRN Larene Pickett, PA-C      . alum & mag hydroxide-simeth (MAALOX/MYLANTA) 200-200-20 MG/5ML suspension 30 mL  30 mL Oral PRN Larene Pickett, PA-C      . benztropine (COGENTIN) tablet 1 mg  1 mg Oral QHS Larene Pickett, PA-C   1 mg at 06/14/15 2210  . citalopram (CELEXA) tablet 40 mg  40 mg Oral Daily Larene Pickett, PA-C   40 mg at 06/15/15 1140  . haloperidol (HALDOL) tablet 5 mg  5 mg Oral QHS Larene Pickett, PA-C   5 mg at 06/14/15 2210  . insulin aspart (novoLOG) injection 0-24 Units  0-24 Units Subcutaneous 6 times per day Linton Flemings, MD   2 Units at 06/15/15 0537  . insulin glargine (LANTUS) injection 25 Units  25 Units Subcutaneous QHS Larene Pickett, PA-C   25 Units at 06/14/15 2209  . lisinopril (PRINIVIL,ZESTRIL) tablet 10 mg  10 mg Oral Daily Larene Pickett, PA-C   10 mg at 06/15/15 1140  . LORazepam (ATIVAN) tablet 0-4 mg  0-4 mg Oral 4 times per day Larene Pickett, PA-C   0 mg at 06/15/15 0000   Followed by  . [START ON 06/16/2015] LORazepam (ATIVAN) tablet 0-4 mg  0-4 mg Oral Q12H Larene Pickett, PA-C      . LORazepam (ATIVAN) tablet 1 mg  1 mg Oral Q8H PRN Larene Pickett, PA-C   1 mg at 06/14/15 2127  . metFORMIN (GLUCOPHAGE) tablet 1,000 mg  1,000 mg Oral BID WC Larene Pickett, PA-C   1,000 mg at 06/15/15 1140  . nicotine (NICODERM CQ - dosed in mg/24 hours) patch 21 mg  21 mg Transdermal Daily Larene Pickett, PA-C   21 mg at 06/15/15 1139  . ondansetron (ZOFRAN) tablet 4 mg  4 mg Oral Q8H PRN Larene Pickett, PA-C      . pantoprazole (PROTONIX) EC tablet 40 mg  40 mg Oral Daily Larene Pickett, PA-C   40 mg at 06/15/15 1140  . risperiDONE (RISPERDAL) tablet 2 mg  2 mg Oral Daily Larene Pickett, PA-C   2 mg at 06/15/15 1141  . simvastatin (ZOCOR) tablet 40 mg  40 mg  Oral QPM Larene Pickett, PA-C   40 mg at 06/14/15 2128  . thiamine (VITAMIN B-1) tablet 100 mg  100 mg Oral Daily Larene Pickett, PA-C   100 mg at 06/15/15 1139   Or  . thiamine (B-1) injection 100 mg  100 mg Intravenous Daily Larene Pickett, PA-C      . traZODone (DESYREL) tablet 100 mg  100 mg Oral QHS Larene Pickett, PA-C   100 mg at 06/14/15 2210  . zolpidem (AMBIEN) tablet 5 mg  5 mg Oral QHS PRN Larene Pickett, PA-C   5 mg at 06/14/15 2127   Current Outpatient Prescriptions  Medication Sig Dispense Refill  . benztropine (COGENTIN) 1 MG tablet Take 1 tablet (1 mg total) by mouth at bedtime. 30 tablet 0  . citalopram (CELEXA) 40 MG tablet Take 1 tablet (40 mg total) by mouth daily. 30 tablet 0  . haloperidol (HALDOL) 5 MG tablet Take 1 tablet (5 mg total) by mouth at bedtime. 30 tablet 0  . insulin aspart (NOVOLOG) 100 UNIT/ML injection Inject 4-15 Units into the skin daily. Diabetes management 1 vial 12  . insulin glargine (LANTUS) 100 UNIT/ML injection Inject 0.25 mLs (25 Units total) into the skin at bedtime. 10 mL 11  . lisinopril (PRINIVIL,ZESTRIL) 10 MG tablet Take 1 tablet (10 mg total) by mouth daily. 30 tablet 0  . metFORMIN (GLUCOPHAGE) 1000 MG tablet Take 1 tablet (1,000 mg total) by mouth 2 (two) times daily with a meal. 60 tablet 0  . simvastatin (ZOCOR) 40 MG tablet Take 1 tablet (40 mg total) by mouth every evening. 30 tablet 0  . traZODone (DESYREL) 100 MG tablet Take 1 tablet (100 mg total) by mouth at bedtime. 30 tablet 0  . amoxicillin (AMOXIL) 500 MG capsule Take 1 capsule (500 mg total) by mouth 3 (three) times daily. (Patient not taking: Reported on 06/14/2015) 12 capsule 0  . nicotine (NICODERM CQ - DOSED IN MG/24 HOURS) 21 mg/24hr patch Place 1 patch (21 mg total) onto the skin daily at 6 (six) AM. (Patient not taking: Reported on 06/14/2015) 28 patch 0  . pantoprazole (PROTONIX) 40 MG tablet Take 1 tablet (40 mg total) by mouth daily. (Patient not taking: Reported on  06/14/2015) 30 tablet 0    Musculoskeletal: Strength & Muscle Tone: within normal limits Gait & Station: normal Patient leans: N/A  Psychiatric Specialty Exam: Physical Exam  Review of Systems  Constitutional: Negative.   HENT: Negative.   Eyes: Negative.   Respiratory: Negative.   Cardiovascular: Negative.   Gastrointestinal: Negative.   Genitourinary: Negative.   Musculoskeletal: Negative.   Skin: Negative.   Neurological: Negative.   Endo/Heme/Allergies: Negative.   Psychiatric/Behavioral: Positive for substance abuse.    Blood pressure 159/82, pulse 68, temperature 98.5 F (36.9 C), temperature source Oral, resp. rate 18, last menstrual period 06/30/2013, SpO2 99 %.There is no weight on file to calculate BMI.  General Appearance: Disheveled  Eye Contact::  Good  Speech:  Normal Rate  Volume:  Normal  Mood:  Euthymic  Affect:  Congruent  Thought Process:  Coherent  Orientation:  Full (Time, Place, and Person)  Thought Content:  WDL  Suicidal Thoughts:  No  Homicidal Thoughts:  No  Memory:  Immediate;   Good Recent;   Good Remote;   Good  Judgement:  Fair  Insight:  Good  Psychomotor Activity:  Normal  Concentration:  Good  Recall:  Good  Fund of Knowledge:Good  Language: Good  Akathisia:  No  Handed:  Right  AIMS (if indicated):     Assets:  Leisure Time Physical Health Resilience  ADL's:  Intact  Cognition: WNL  Sleep:      Medical Decision Making: Review of Psycho-Social Stressors (1), Review or order clinical lab tests (1) and Review of Medication Regimen & Side Effects (2)  Treatment Plan Summary: Dg:  Cocaine dependence with cocaine induced mood disorder Daily contact with patient to assess and evaluate symptoms and progress in treatment, Medication management and Plan counseling and referrals provided for substance abuse, blood glucose monitored/treated and stabilized  Plan:  No evidence of imminent risk to self or others at present.    Disposition: Discharge  Waylan Boga, [place] 06/15/2015 1:18 PM Patient seen face-to-face for psychiatric evaluation, chart reviewed and case discussed with the physician extender and developed treatment plan. Reviewed the information documented and agree with the treatment plan. Corena Pilgrim, MD

## 2015-06-15 NOTE — BH Assessment (Signed)
[facility] Assessment Progress Note  Per Thedore MinsMojeed Akintayo, MD, this pt does not require psychiatric hospitalization at this time.  [facility] to residential substance abuse treatment providers.  Discharge instructions include referrals to [facility], [facility], and [facility].  Pt's nurse has been notified.  Doylene Canninghomas Emmelina Mcloughlin, MA Triage Specialist [phone]

## 2015-06-15 NOTE — [facility] Suicide Risk Assessment (Signed)
Suicide Risk Assessment  Discharge Assessment   [facility]   Demographic Factors:  NA  Total Time spent with patient: 45 minutes    Musculoskeletal: Strength & Muscle Tone: within normal limits Gait & Station: normal Patient leans: N/A  Psychiatric Specialty Exam: Physical Exam  Review of Systems  Constitutional: Negative.   HENT: Negative.   Eyes: Negative.   Respiratory: Negative.   Cardiovascular: Negative.   Gastrointestinal: Negative.   Genitourinary: Negative.   Musculoskeletal: Negative.   Skin: Negative.   Neurological: Negative.   Endo/Heme/Allergies: Negative.   Psychiatric/Behavioral: Positive for substance abuse.    Blood pressure 159/82, pulse 68, temperature 98.5 F (36.9 C), temperature source Oral, resp. rate 18, last menstrual period 06/30/2013, SpO2 99 %.There is no weight on file to calculate BMI.  General Appearance: Disheveled  Eye Contact::  Good  Speech:  Normal Rate  Volume:  Normal  Mood:  Euthymic  Affect:  Congruent  Thought Process:  Coherent  Orientation:  Full (Time, Place, and Person)  Thought Content:  WDL  Suicidal Thoughts:  No  Homicidal Thoughts:  No  Memory:  Immediate;   Good Recent;   Good Remote;   Good  Judgement:  Fair  Insight:  Good  Psychomotor Activity:  Normal  Concentration:  Good  Recall:  Good  Fund of Knowledge:Good  Language: Good  Akathisia:  No  Handed:  Right  AIMS (if indicated):     Assets:  Leisure Time Physical Health Resilience  ADL's:  Intact  Cognition: WNL  Sleep:          Has this patient used any form of tobacco in the last 30 days? (Cigarettes, Smokeless Tobacco, Cigars, and/or Pipes) Yes, A prescription for an FDA-approved tobacco cessation medication was offered at discharge and the patient refused  Mental Status Per Nursing Assessment::   On Admission:   Cocaine abuse  Current Mental Status by Physician: NA  Loss Factors: NA  Historical  Factors: NA  Risk Reduction Factors:   Sense of responsibility to family, Living with another person, especially a relative and Positive social support  Continued Clinical Symptoms:  Substance abuse  Cognitive Features That Contribute To Risk:  None    Suicide Risk:  Minimal: No identifiable suicidal ideation.  Patients presenting with no risk factors but with morbid ruminations; may be classified as minimal risk based on the severity of the depressive symptoms  Principal Problem: Cocaine dependence with cocaine-induced mood disorder Discharge Diagnoses:  Patient Active Problem List   Diagnosis Date Noted  . Cocaine dependence with cocaine-induced mood disorder [F14.24]     Priority: High  . Alcohol dependence [F10.20] 08/26/2013    Priority: High  . Cocaine dependence [F14.20] 08/26/2013    Priority: High  . Severe major depression with psychotic features [F32.3] 08/26/2013    Priority: High  . Suicidal ideation [R45.851]   . Severe recurrent major depressive disorder with psychotic features [F33.3]   . Substance abuse [F19.10]   . Alcohol use disorder, severe, dependence [F10.20] 02/25/2015  . Diabetes [E11.9] 04/08/2014  . Major depression [F32.2] 04/07/2014      Plan Of Care/Follow-up recommendations:  Activity:  as tolerated Diet:  heart healthy diet  Is patient on multiple antipsychotic therapies at discharge:  No   Has Patient had three or more failed trials of antipsychotic monotherapy by history:  No  Recommended Plan for Multiple Antipsychotic Therapies: NA    LORD, JAMISON, PMH-NP 06/15/2015, 1:25 PM

## 2015-06-15 NOTE — ED Notes (Signed)
I called and spoke with the pt's sister who is suppose to be coming to pick her up to find out an ETOA. Her sister stated that is would be no longer than 45 minutes. Will continue to monitor the pt and have her ready for DC and pick up in 45 min.

## 2015-06-15 NOTE — Discharge Instructions (Signed)
To help you maintain a sober lifestyle, a substance abuse treatment program may be beneficial to you.  Contact one of the following providers at your earliest convenience to enroll in their program: ° °     [facility] °     [address] °     [place], [place] [address] °     [phone] ° °     [facility] °     [address] °     [place], [place] [address] °     [phone] ° °     [facility] °     [address] °     [place], [place] [address] °     [phone] °

## 2015-12-02 ENCOUNTER — Encounter (HOSPITAL_BASED_OUTPATIENT_CLINIC_OR_DEPARTMENT_OTHER): Payer: Self-pay | Admitting: Emergency Medicine

## 2015-12-02 ENCOUNTER — Emergency Department (HOSPITAL_BASED_OUTPATIENT_CLINIC_OR_DEPARTMENT_OTHER)
Admission: EM | Admit: 2015-12-02 | Discharge: 2015-12-02 | Disposition: A | Discharge status: Home / Self Care | Payer: Medicaid Other | Attending: Emergency Medicine | Admitting: Emergency Medicine

## 2015-12-02 DIAGNOSIS — F17210 Nicotine dependence, cigarettes, uncomplicated: Secondary | ICD-10-CM | POA: Insufficient documentation

## 2015-12-02 DIAGNOSIS — E119 Type 2 diabetes mellitus without complications: Secondary | ICD-10-CM | POA: Insufficient documentation

## 2015-12-02 DIAGNOSIS — N72 Inflammatory disease of cervix uteri: Secondary | ICD-10-CM | POA: Insufficient documentation

## 2015-12-02 DIAGNOSIS — I10 Essential (primary) hypertension: Secondary | ICD-10-CM | POA: Insufficient documentation

## 2015-12-02 DIAGNOSIS — Z202 Contact with and (suspected) exposure to infections with a predominantly sexual mode of transmission: Secondary | ICD-10-CM | POA: Insufficient documentation

## 2015-12-02 DIAGNOSIS — Z794 Long term (current) use of insulin: Secondary | ICD-10-CM | POA: Insufficient documentation

## 2015-12-02 DIAGNOSIS — Z8739 Personal history of other diseases of the musculoskeletal system and connective tissue: Secondary | ICD-10-CM | POA: Insufficient documentation

## 2015-12-02 DIAGNOSIS — J45909 Unspecified asthma, uncomplicated: Secondary | ICD-10-CM | POA: Insufficient documentation

## 2015-12-02 DIAGNOSIS — Z7984 Long term (current) use of oral hypoglycemic drugs: Secondary | ICD-10-CM | POA: Insufficient documentation

## 2015-12-02 DIAGNOSIS — Z79899 Other long term (current) drug therapy: Secondary | ICD-10-CM | POA: Insufficient documentation

## 2015-12-02 DIAGNOSIS — F319 Bipolar disorder, unspecified: Secondary | ICD-10-CM | POA: Insufficient documentation

## 2015-12-02 LAB — WET PREP, GENITAL
Sperm: NONE SEEN
YEAST WET PREP: NONE SEEN

## 2015-12-02 MED ORDER — CEFTRIAXONE SODIUM 250 MG IJ SOLR
250.0000 mg | Freq: Once | INTRAMUSCULAR | Status: AC
  Administered 2015-12-02: 250 mg via INTRAMUSCULAR
  Filled 2015-12-02: qty 250

## 2015-12-02 MED ORDER — AZITHROMYCIN 250 MG PO TABS
1000.0000 mg | ORAL_TABLET | Freq: Once | ORAL | Status: AC
  Administered 2015-12-02: 1000 mg via ORAL
  Filled 2015-12-02: qty 4

## 2015-12-02 MED ORDER — DOXYCYCLINE HYCLATE 100 MG PO CAPS: 100.0000 mg | ORAL_CAPSULE | Freq: Two times a day (BID) | ORAL | Status: DC

## 2015-12-02 MED ORDER — METRONIDAZOLE 500 MG PO TABS
2000.0000 mg | ORAL_TABLET | Freq: Once | ORAL | Status: AC
  Administered 2015-12-02: 2000 mg via ORAL
  Filled 2015-12-02: qty 4

## 2015-12-02 NOTE — Discharge Instructions (Signed)
You were treated in the emergency department for STDs. Please take your antibiotics as prescribed. Follow-up with your doctor for reevaluation. Please practice safe sexual practices. Return to ED for any new or worsening symptoms.  Cervicitis Cervicitis is a soreness and swelling (inflammation) of the cervix. Your cervix is located at the bottom of your uterus. It opens up to the vagina. CAUSES   Sexually transmitted infections (STIs).   Allergic reaction.   Medicines or birth control devices that are put in the vagina.   Injury to the cervix.   Bacterial infections.  RISK FACTORS You are at greater risk if you:  Have unprotected sexual intercourse.  Have sexual intercourse with many partners.  Began sexual intercourse at an early age.  Have a history of STIs. SYMPTOMS  There may be no symptoms. If symptoms occur, they may include:   Gray, white, yellow, or bad-smelling vaginal discharge.   Pain or itching of the area outside the vagina.   Painful sexual intercourse.   Lower abdominal or lower back pain, especially during intercourse.   Frequent urination.   Abnormal vaginal bleeding between periods, after sexual intercourse, or after menopause.   Pressure or a heavy feeling in the pelvis.  DIAGNOSIS  Diagnosis is made after a pelvic exam. Other tests may include:   Examination of any discharge under a microscope (wet prep).   A Pap test.  TREATMENT  Treatment will depend on the cause of cervicitis. If it is caused by an STI, both you and your partner will need to be treated. Antibiotic medicines will be given.  HOME CARE INSTRUCTIONS   Do not have sexual intercourse until your health care provider says it is okay.   Do not have sexual intercourse until your partner has been treated, if your cervicitis is caused by an STI.   Take your antibiotics as directed. Finish them even if you start to feel better.  SEEK MEDICAL CARE IF:  Your symptoms  come back.   You have a fever.  MAKE SURE YOU:   Understand these instructions.  Will watch your condition.  Will get help right away if you are not doing well or get worse.   This information is not intended to replace advice given to you by your health care provider. Make sure you discuss any questions you have with your health care provider.   Document Released: 11/13/2005 Document Revised: 11/18/2013 Document Reviewed: 05/07/2013 Else[employer] Interactive Patient Education [employer]2016 [employer]

## 2015-12-02 NOTE — ED Provider Notes (Signed)
CSN: [id number]     Arrival date & time 12/02/15  1810 History  By signing my name below, I, Jarvis Morgan, attest that this documentation has been prepared under the direction and in the presence of Joycie Peek, PA-C Electronically Signed: Jarvis Morgan, ED Scribe. 12/06/2015. 6:31 AM.    Chief Complaint  Patient presents with  . Vaginal Discharge    The history is provided by the patient. No language interpreter was used.    HPI Comments: Diana Santos is a 55 y.o. female who presents to the Emergency Department complaining of intermittent, moderate, brown vaginal discharge that began 2 days ago. She notes she was recently using Monistat vaginal cream for a yeast infection; she states she has been using it for 1 week. Pt is a resident of [facility] for crack/cocaine treatment. She notes she recently had sexual relations with different partners around 1 month ago in exchange for drugs. Pt denies any new sexual partners since. She denies fever, nausea, vomiting, abdominal pain or urinary symptoms.  Past Medical History  Diagnosis Date  . Diabetes mellitus without complication (HCC)   . Hypertension   . Asthma   . Arthritis   . Bipolar 1 disorder, depressed (HCC)   . Substance abuse    Past Surgical History  Procedure Laterality Date  . C section     History reviewed. No pertinent family history. Social History  Substance Use Topics  . Smoking status: Current Some Day Smoker -- 1.00 packs/day    Types: Cigarettes  . Smokeless tobacco: Never Used  . Alcohol Use: Yes     Comment: three to four 40oz beers daily   OB History    No data available     Review of Systems  All other systems reviewed and are negative.     Allergies  Aspirin  Home Medications   Prior to Admission medications   Medication Sig Start Date End Date Taking? Authorizing Provider  amoxicillin (AMOXIL) 500 MG capsule Take 1 capsule (500 mg total) by mouth 3 (three) times daily. Patient not  taking: Reported on 06/14/2015 05/25/15   Adonis Brook, NP  benztropine (COGENTIN) 1 MG tablet Take 1 tablet (1 mg total) by mouth at bedtime. 05/25/15   Adonis Brook, NP  citalopram (CELEXA) 40 MG tablet Take 1 tablet (40 mg total) by mouth daily. 05/25/15   Adonis Brook, NP  doxycycline (VIBRAMYCIN) 100 MG capsule Take 1 capsule (100 mg total) by mouth 2 (two) times daily. One po bid x 7 days 12/02/15   Joycie Peek, PA-C  haloperidol (HALDOL) 5 MG tablet Take 1 tablet (5 mg total) by mouth at bedtime. 05/25/15   Adonis Brook, NP  insulin aspart (NOVOLOG) 100 UNIT/ML injection Inject 4-15 Units into the skin daily. Diabetes management 05/25/15   Adonis Brook, NP  insulin glargine (LANTUS) 100 UNIT/ML injection Inject 0.25 mLs (25 Units total) into the skin at bedtime. 05/25/15   Adonis Brook, NP  lisinopril (PRINIVIL,ZESTRIL) 10 MG tablet Take 1 tablet (10 mg total) by mouth daily. 05/25/15   Adonis Brook, NP  metFORMIN (GLUCOPHAGE) 1000 MG tablet Take 1 tablet (1,000 mg total) by mouth 2 (two) times daily with a meal. 05/25/15   Adonis Brook, NP  nicotine (NICODERM CQ - DOSED IN MG/24 HOURS) 21 mg/24hr patch Place 1 patch (21 mg total) onto the skin daily at 6 (six) AM. Patient not taking: Reported on 06/14/2015 05/25/15   Adonis Brook, NP  pantoprazole (PROTONIX) 40 MG tablet Take 1  tablet (40 mg total) by mouth daily. Patient not taking: Reported on 06/14/2015 05/25/15   Adonis BrookSheila Agustin, NP  simvastatin (ZOCOR) 40 MG tablet Take 1 tablet (40 mg total) by mouth every evening. 05/25/15   Adonis BrookSheila Agustin, NP  traZODone (DESYREL) 100 MG tablet Take 1 tablet (100 mg total) by mouth at bedtime. 05/25/15   Adonis BrookSheila Agustin, NP   Triage Vitals: BP 155/114 mmHg  Pulse 81  Temp(Src) 98.5 F (36.9 C) (Oral)  Resp 18  Wt 127 lb (57.607 kg)  SpO2 100%  LMP 06/30/2013  Physical Exam  Constitutional: She is oriented to person, place, and time. She appears well-developed and well-nourished. No  distress.  HENT:  Head: Normocephalic and atraumatic.  Mouth/Throat: Oropharynx is clear and moist.  Eyes: Conjunctivae and EOM are normal. Pupils are equal, round, and reactive to light. Right eye exhibits no discharge. Left eye exhibits no discharge. No scleral icterus.  Neck: Neck supple. No tracheal deviation present.  Cardiovascular: Normal rate, regular rhythm and normal heart sounds.   Pulmonary/Chest: Effort normal and breath sounds normal. No respiratory distress. She has no wheezes. She has no rales.  Abdominal: Soft. There is no tenderness.  Genitourinary:  Chaperone was present for the entire genital exam. No lesions or rashes appreciated on vulva. Cervix visualized on speculum exam and appropriate cultures sampled. Cervix appears mildly friable. Scant blood in vaginal vault. Discharge: Mucopurulent Upon bi manual exam- No TTP of the adnexa, no cervical motion tenderness. No fullness or masses appreciated. No abnormalities appreciated in structural anatomy.   Musculoskeletal: Normal range of motion. She exhibits no tenderness.  Neurological: She is alert and oriented to person, place, and time.  Cranial Nerves II-XII grossly intact  Skin: Skin is warm and dry. No rash noted.  Psychiatric: She has a normal mood and affect. Her behavior is normal.  Nursing note and vitals reviewed.   ED Course  Procedures (including critical care time)  DIAGNOSTIC STUDIES: Oxygen Saturation is 100% on RA, normal by my interpretation.    COORDINATION OF CARE:    Labs Review Labs Reviewed  WET PREP, GENITAL - Abnormal; Notable for the following:    Trich, Wet Prep PRESENT (*)    Clue Cells Wet Prep HPF POC PRESENT (*)    WBC, Wet Prep HPF POC MANY (*)    All other components within normal limits  RPR  HIV ANTIBODY (ROUTINE TESTING)  GC/CHLAMYDIA PROBE AMP ([place]    Imaging Review No results found. I have personally reviewed and evaluated these images and lab  results as part of my medical decision-making.   EKG Interpretation None     Meds given in ED:  Medications  cefTRIAXone (ROCEPHIN) injection 250 mg (250 mg Intramuscular Given 12/02/15 2007)  azithromycin (ZITHROMAX) tablet 1,000 mg (1,000 mg Oral Given 12/02/15 2007)  metroNIDAZOLE (FLAGYL) tablet 2,000 mg (2,000 mg Oral Given 12/02/15 2007)    Discharge Medication List as of 12/02/2015  8:11 PM    START taking these medications   Details  doxycycline (VIBRAMYCIN) 100 MG capsule Take 1 capsule (100 mg total) by mouth 2 (two) times daily. One po bid x 7 days, Starting 12/02/2015, Until Discontinued, Print       Filed Vitals:   12/02/15 1819 12/02/15 2032  BP: 155/114 140/95  Pulse: 81 83  Temp: 98.5 F (36.9 C) 97.9 F (36.6 C)  TempSrc: Oral Oral  Resp: 18 18  Weight: 57.607 kg   SpO2: 100% 98%  MDM  Lianna Sitzmann is a 55 y.o. female with a history of bipolar 1 disorder, cocaine abuse, diabetes, comes in for eval administration of vaginal discharge. Patient denies any abdominal pain, fevers or chills, pelvic pain, urinary symptoms. She does report a new sexual contact approximately one month ago. She would like to be treated for exposure to STI. On exam, there is no abdominal tenderness, cervix is mildly friable with mucopurulent discharge, no other adnexal tenderness. Will treat for cervicitis. Pending GC chlamydia cultures, RPR, HIV. Encourage follow-up with PCP as well as safe sexual practices. The patient appears reasonably screened and/or stabilized for discharge and I doubt any other medical condition or other [facility] requiring further screening, evaluation, or treatment in the ED at this time prior to discharge.   Final diagnoses:  Possible exposure to STD  Cervicitis    I personally performed the services described in this documentation, which was scribed in my presence. The recorded information has been reviewed and is accurate.    Joycie Peek, PA-C 12/06/15  0631  Arby Barrette, MD 12/07/15 [phone]

## 2015-12-02 NOTE — ED Notes (Signed)
Patient is from [facility]

## 2015-12-02 NOTE — ED Notes (Signed)
Patient states that she has brown drainage from her vagina

## 2015-12-03 LAB — GC/CHLAMYDIA PROBE AMP (~~LOC~~) NOT AT ARMC
Chlamydia: NEGATIVE
Neisseria Gonorrhea: NEGATIVE

## 2015-12-04 LAB — RPR: RPR Ser Ql: NONREACTIVE

## 2015-12-04 LAB — HIV ANTIBODY (ROUTINE TESTING W REFLEX): HIV Screen 4th Generation wRfx: NONREACTIVE

## 2015-12-22 ENCOUNTER — Encounter (HOSPITAL_BASED_OUTPATIENT_CLINIC_OR_DEPARTMENT_OTHER): Payer: Self-pay | Admitting: Emergency Medicine

## 2015-12-22 ENCOUNTER — Emergency Department (HOSPITAL_BASED_OUTPATIENT_CLINIC_OR_DEPARTMENT_OTHER)
Admission: EM | Admit: 2015-12-22 | Discharge: 2015-12-22 | Disposition: A | Discharge status: Home / Self Care | Payer: Medicaid Other | Attending: Emergency Medicine | Admitting: Emergency Medicine

## 2015-12-22 DIAGNOSIS — Z794 Long term (current) use of insulin: Secondary | ICD-10-CM | POA: Insufficient documentation

## 2015-12-22 DIAGNOSIS — Z7984 Long term (current) use of oral hypoglycemic drugs: Secondary | ICD-10-CM | POA: Insufficient documentation

## 2015-12-22 DIAGNOSIS — K120 Recurrent oral aphthae: Secondary | ICD-10-CM | POA: Insufficient documentation

## 2015-12-22 DIAGNOSIS — Z79899 Other long term (current) drug therapy: Secondary | ICD-10-CM | POA: Insufficient documentation

## 2015-12-22 DIAGNOSIS — Z8739 Personal history of other diseases of the musculoskeletal system and connective tissue: Secondary | ICD-10-CM | POA: Insufficient documentation

## 2015-12-22 DIAGNOSIS — E119 Type 2 diabetes mellitus without complications: Secondary | ICD-10-CM | POA: Insufficient documentation

## 2015-12-22 DIAGNOSIS — F17210 Nicotine dependence, cigarettes, uncomplicated: Secondary | ICD-10-CM | POA: Insufficient documentation

## 2015-12-22 DIAGNOSIS — I10 Essential (primary) hypertension: Secondary | ICD-10-CM | POA: Insufficient documentation

## 2015-12-22 DIAGNOSIS — F319 Bipolar disorder, unspecified: Secondary | ICD-10-CM | POA: Insufficient documentation

## 2015-12-22 MED ORDER — ACYCLOVIR 400 MG PO TABS: 400.0000 mg | ORAL_TABLET | Freq: Three times a day (TID) | ORAL | Status: DC

## 2015-12-22 MED ORDER — LIDOCAINE VISCOUS 2 % MT SOLN: 10.0000 mL | OROMUCOSAL | Status: DC | PRN

## 2015-12-22 MED ORDER — LIDOCAINE VISCOUS 2 % MT SOLN
15.0000 mL | Freq: Once | OROMUCOSAL | Status: AC
  Administered 2015-12-22: 15 mL via OROMUCOSAL
  Filled 2015-12-22: qty 15

## 2015-12-22 NOTE — ED Notes (Addendum)
Pt states bump on tongue since Saturday, tried to pop it but won't go away

## 2015-12-22 NOTE — Discharge Instructions (Signed)
Canker Sores Canker sores are small, painful sores that develop inside your mouth. They may also be called aphthous ulcers. You can get canker sores on the inside of your lips or cheeks, on your tongue, or anywhere inside your mouth. You can have just one canker sore or several of them. Canker sores cannot be passed from one person to another (noncontagious). These sores are different than the sores that you may get on the outside of your lips (cold sores or fever blisters). Canker sores usually start as painful red bumps. Then they turn into small white, yellow, or gray ulcers that have red borders. The ulcers may be quite painful. The pain may be worse when you eat or drink. CAUSES The cause of this condition is not known. RISK FACTORS This condition is more likely to develop in:  Women.  People in their teens or 65s.  Women who are having their menstrual period.  People who are under a lot of emotional stress.  People who do not get enough iron or B vitamins.  People who have poor oral hygiene.  People who have an injury inside the mouth. This can happen after having dental work or from chewing something hard. SYMPTOMS Along with the canker sore, symptoms may also include:  Fever.  Fatigue.  Swollen lymph nodes in your neck. DIAGNOSIS This condition can be diagnosed based on your symptoms. Your health care provider will also examine your mouth. Your health care provider may also do tests if you get canker sores often or if they are very bad. Tests may include:  Blood tests to rule out other causes of canker sores.  Taking swabs from the sore to check for infection.  Taking a small piece of skin from the sore (biopsy) to test it for cancer. TREATMENT Most canker sores clear up without treatment in about 10 days. Home care is usually the only treatment that you will need. Over-the-counter medicines can relieve discomfort.If you have severe canker sores, your health care  provider may prescribe:  Numbing ointment to relieve pain.  Vitamins.  Steroid medicines. These may be given as:  Oral pills.  Mouth rinses.  Gels.  Antibiotic mouth rinse. HOME CARE INSTRUCTIONS  Apply, take, or use medicines only as directed by your health care provider. These include vitamins.  If you were prescribed an antibiotic mouth rinse, finish all of it even if you start to feel better.  Until the sores are healed:  Do not drink coffee or citrus juices.  Do not eat spicy or salty foods.  Use a mild, over-the-counter mouth rinse as directed by your health care provider.  Practice good oral hygiene.  Floss your teeth every day.  Brush your teeth with a soft brush twice each day. SEEK MEDICAL CARE IF:  Your symptoms do not get better after two weeks.  You also have a fever or swollen glands.  You get canker sores often.  You have a canker sore that is getting larger.  You cannot eat or drink due to your canker sores.   This information is not intended to replace advice given to you by your health care provider. Make sure you discuss any questions you have with your health care provider.   Document Released: 03/10/2011 Document Revised: 03/30/2015 Document Reviewed: 10/14/2014 Elsevier Interactive Patient Education 2016 [employer].   Smoking Cessation, Tips for Success If you are ready to quit smoking, congratulations! You have chosen to help yourself be healthier. Cigarettes bring nicotine, tar,  carbon monoxide, and other irritants into your body. Your lungs, heart, and blood vessels will be able to work better without these poisons. There are many different ways to quit smoking. Nicotine gum, nicotine patches, a nicotine inhaler, or nicotine nasal spray can help with physical craving. Hypnosis, support groups, and medicines help break the habit of smoking. WHAT THINGS CAN I DO TO MAKE QUITTING EASIER?  Here are some tips to help you quit for  good:  Pick a date when you will quit smoking completely. Tell all of your friends and family about your plan to quit on that date.  Do not try to slowly cut down on the number of cigarettes you are smoking. Pick a quit date and quit smoking completely starting on that day.  Throw away all cigarettes.   Clean and remove all ashtrays from your home, work, and car.  On a card, write down your reasons for quitting. Carry the card with you and read it when you get the urge to smoke.  Cleanse your body of nicotine. Drink enough water and fluids to keep your urine clear or pale yellow. Do this after quitting to flush the nicotine from your body.  Learn to predict your moods. Do not let a bad situation be your excuse to have a cigarette. Some situations in your life might tempt you into wanting a cigarette.  Never have "just one" cigarette. It leads to wanting another and another. Remind yourself of your decision to quit.  Change habits associated with smoking. If you smoked while driving or when feeling stressed, try other activities to replace smoking. Stand up when drinking your coffee. Brush your teeth after eating. Sit in a different chair when you read the paper. Avoid alcohol while trying to quit, and try to drink fewer caffeinated beverages. Alcohol and caffeine may urge you to smoke.  Avoid foods and drinks that can trigger a desire to smoke, such as sugary or spicy foods and alcohol.  Ask people who smoke not to smoke around you.  Have something planned to do right after eating or having a cup of coffee. For example, plan to take a walk or exercise.  Try a relaxation exercise to calm you down and decrease your stress. Remember, you may be tense and nervous for the first 2 weeks after you quit, but this will pass.  Find new activities to keep your hands busy. Play with a pen, coin, or rubber band. Doodle or draw things on paper.  Brush your teeth right after eating. This will help  cut down on the craving for the taste of tobacco after meals. You can also try mouthwash.   Use oral substitutes in place of cigarettes. Try using lemon drops, carrots, cinnamon sticks, or chewing gum. Keep them handy so they are available when you have the urge to smoke.  When you have the urge to smoke, try deep breathing.  Designate your home as a nonsmoking area.  If you are a heavy smoker, ask your health care provider about a prescription for nicotine chewing gum. It can ease your withdrawal from nicotine.  Reward yourself. Set aside the cigarette money you save and buy yourself something nice.  Look for support from others. Join a support group or smoking cessation program. Ask someone at home or at work to help you with your plan to quit smoking.  Always ask yourself, "Do I need this cigarette or is this just a reflex?" Tell yourself, "Today, I choose not  to smoke," or "I do not want to smoke." You are reminding yourself of your decision to quit.  Do not replace cigarette smoking with electronic cigarettes (commonly called e-cigarettes). The safety of e-cigarettes is unknown, and some may contain harmful chemicals.  If you relapse, do not give up! Plan ahead and think about what you will do the next time you get the urge to smoke. HOW WILL I FEEL WHEN I QUIT SMOKING? You may have symptoms of withdrawal because your body is used to nicotine (the addictive substance in cigarettes). You may crave cigarettes, be irritable, feel very hungry, cough often, get headaches, or have difficulty concentrating. The withdrawal symptoms are only temporary. They are strongest when you first quit but will go away within 10-14 days. When withdrawal symptoms occur, stay in control. Think about your reasons for quitting. Remind yourself that these are signs that your body is healing and getting used to being without cigarettes. Remember that withdrawal symptoms are easier to treat than the major diseases  that smoking can cause.  Even after the withdrawal is over, expect periodic urges to smoke. However, these cravings are generally short lived and will go away whether you smoke or not. Do not smoke! WHAT RESOURCES ARE AVAILABLE TO HELP ME QUIT SMOKING? Your health care provider can direct you to community resources or hospitals for support, which may include:  Group support.  Education.  Hypnosis.  Therapy.   This information is not intended to replace advice given to you by your health care provider. Make sure you discuss any questions you have with your health care provider.   Document Released: 08/11/2004 Document Revised: 12/04/2014 Document Reviewed: 05/01/2013 Elsevier Interactive Patient Education 2016 [employer].    Emergency Department Resource Guide 1) Find a Doctor and Pay Out of Pocket Although you won't have to find out who is covered by your insurance plan, it is a good idea to ask around and get recommendations. You will then need to call the office and see if the doctor you have chosen will accept you as a new patient and what types of options they offer for patients who are self-pay. Some doctors offer discounts or will set up payment plans for their patients who do not have insurance, but you will need to ask so you aren't surprised when you get to your appointment.  2) Contact Your Local Health Department Not all health departments have doctors that can see patients for sick visits, but many do, so it is worth a call to see if yours does. If you don't know where your local health department is, you can check in your phone book. The CDC also has a tool to help you locate your state's health department, and many state websites also have listings of all of their local health departments.  3) Find a Walk-in Clinic If your illness is not likely to be very severe or complicated, you may want to try a walk in clinic. These are popping up all over the country in pharmacies,  drugstores, and shopping centers. They're usually staffed by nurse practitioners or physician assistants that have been trained to treat common illnesses and complaints. They're usually fairly quick and inexpensive. However, if you have serious medical issues or chronic medical problems, these are probably not your best option.  No Primary Care Doctor: - Call Health Connect at  [phone] - they can help you locate a primary care doctor that  accepts your insurance, provides certain services, etc. -  Physician Referral Service- [phone]  Chronic Pain Problems: Organization         Address  Phone   Notes  [facility]  [phone] Patients need to be referred by their primary care doctor.   Medication Assistance: Organization         Address  Phone   Notes  [facility] [address] [place] [place], [place] [address] [phone] --Must be a resident of [facility] -- Must have NO insurance coverage whatsoever (no Medicaid/ Medicare, etc.) -- The pt. MUST have a primary care doctor that directs their care regularly and follows them in the community   [facility]  [phone]   [employer]  [phone]    Agencies that provide inexpensive medical care: Organization         Address  Phone   Notes  [facility]  [phone]   [facility]    [phone]   [facility] [address] [place], [place] [address] [phone]   [facility] [address] [place] [place] [phone]   Planned Parenthood    [phone]   [facility]    [phone]   [facility]  [address], [place] Phone:  [phone], Fax:  [phone] Hours of Operation:  9 am - 6 pm, M-F.  Also accepts Medicaid/Medicare and self-pay.  [facility]  [address], [place] Phone:  [phone], Fax: [phone]. Hours of Operation:  8:30 am - 5:30 pm, M-F.  Also accepts Medicaid and self-pay.  [facility] [place] [address] [place] Phone: [phone]   [facility] [address] Natasha Bence [place], [place] [phone], Ext. [phone] Mondays & Thursdays: 7-9 AM.  First 15 patients are seen on a first come, first serve basis.    Medicaid-accepting [facility] Providers:  Organization         Address  Phone   Notes  [facility] [address], [place] [phone] Also accepts self-pay patients.  [facility] [address] Laurell Josephs [place], [place]  [phone]   [facility] [address], [place] [phone]   [facility] [address], [place] [phone]   Renaye Rakers [address], [place]   [phone] Only accepts [place] patients after they have their name applied to their card.   Self-Pay (no insurance) in [facility]:  Organization         Address  Phone   Notes  Sickle Cell Patients, [facility] [address] [place], [place] [phone]   [facility] [address] [place], [place] [phone]   [facility] [place]  [address], [place] [phone]   [facility]/Dr. Osei-Bonsu  [address], [place] or [address], [place] [phone] Phone number for both [place] and [place] locations is the same.  [facility] [address] [place] [phone]   [facility] [address], [place] or [address] [phone]   [facility] [address] [place], [place] [phone], phone; [phone], fax Sees patients 1st and 3rd Saturday of every month.  Must not qualify for public  or private insurance (i.e. Medicaid, Medicare, [place] Health Choice, Veterans' Benefits)  Household income should be no more than 200% of the poverty level The clinic cannot treat you if you are pregnant or think you are pregnant  Sexually transmitted diseases are not treated at the clinic.    Dental Care: Organization         Address  Phone  Notes  [facility] [address] [place], [place] [phone] Accepts children up to age 37 who are enrolled in [place] or [place] Health Choice; pregnant women with a Medicaid card; and children who have applied for Medicaid or [place] Health Choice, but were declined, whose parents can pay a reduced fee at time of service.  [facility] Department of [facility]  [address], [place] [phone] Accepts children up to age 58 who are enrolled in [place] or [place] Health Choice; pregnant women with a Medicaid card; and children who have applied for Medicaid or [place] Health Choice, but were declined, whose parents can pay a reduced fee at time of service.  [facility]  [address] [place], [place] [phone] Patients are seen by appointment only. Walk-ins are not accepted. [facility] will see patients 44 years of age and older. Monday - Tuesday (8am-5pm) Most Wednesdays (8:30-5pm) $30 per visit, cash only  [facility] Adult Dental Access PRO[facility]  [address], [facility] [phone] Patients are seen by appointment only. Walk-ins are not accepted. [facility] will see patients 74 years of age and older. One Wednesday Evening (Monthly: Volunteer Based).  $30 per visit, cash only  [employer]  [phone] for adults; Children under age 52, call Graduate Pediatric Dentistry at [phone]. Children aged 13-14, please call [phone] to request a pediatric application.  Dental services are provided in all areas of  dental care including fillings, crowns and bridges, complete and partial dentures, implants, gum treatment, root canals, and extractions. Preventive care is also provided. Treatment is provided to both adults and children. Patients are selected via a lottery and there is often a waiting list.   [facility] [address] [place]  [phone] [email or web address]   [facility] [address] [place], [place] [phone], Ext. [phone] Second and Fourth Thursday of each month, opens at 6:30 AM; Clinic ends at 9 AM.  Patients are seen on a first-come first-served basis, and a limited number are seen during each clinic.   [facility]  [address] Ether Griffins [place], [place] [phone]   Eligibility Requirements You must have lived in [place], [place], or [place] counties for at least the last three months.   You cannot be eligible for state or federal sponsored [place], including [employer], [place], or [employer].   You generally cannot be eligible for healthcare insurance through your employer.    How to apply: Eligibility screenings are held every Tuesday and Wednesday afternoon from 1:00 pm until 4:00 pm. You do not need an appointment for the interview!  [facility] [address], [place], [place] [phone]   [facility] Health Department  [phone]   [facility] Health Department  [phone]   [facility] Health Department  [phone]    Behavioral Health Resources in the  Community: Intensive Outpatient Production manager         Address  Phone  Notes  [facility] [address], [place], [place] [phone]   [facility] Outpatient [address] [place], [place] [phone]   [facility] [address], [place], [place]  [phone]   [facility] Mental Health [address],  [place], [place] [phone] or  [phone]   Substance Abuse Resources Organization         Address  Phone  Notes  [facility]  [phone]   [facility]  [phone]   [facility]  [phone]   Floydene Flock  [phone]   Residential & Outpatient Substance Abuse Program  [phone]   Psychological Services Organization         Address  Phone  Notes  [facility] Behavioral Health  [phone]   [facility] Services  [phone]   [facility] Mental Health [address] [place] [phone] or [phone]    Mobile Crisis Teams Organization         Address  Phone  Notes  [facility], Mobile Crisis Care Unit  [phone]   [facility]  [address] [place], [place] [phone]   Doristine Locks [address] [place] [phone]    Self-Help/Support Groups Organization         Address  Phone             Notes  [facility] [place] - variety of support groups  [phone]- I7437963 Call for more information  Narcotics Anonymous (NA), [facility] [address], [employer] [place]  2 meetings at this location   Statistician         Address  Phone  Notes  [facility] [address],    [place]  [phone]   [facility]  [address] [place] [address], [place], [place] [phone]   [facility] [address] [place], [place] [phone] Admissions: 8am-3pm M-F  [facility] [address],    [place], [place] [phone]   [facility] [address] [place], [place], [place] [phone]   [facility] [address],  [place], [place] [phone]   [facility] - Intensive Outpatient [address] Laurell Josephs [address], [place], [place] [phone]   [facility] ([facility]iction [facility] [address] [place]  [place], [place] [phone] or [phone]   [facility]idential  Treatment Services ([facility]) [address], [place], [place] [phone] Accepts Medicaid  [facility] [address],  [place] [phone] Substance Abuse/Addiction Treatment   [facility] Organization         Address  Phone  Notes  [facility]  [phone]   Angie Fava, PhD [address] Ervin Knack [place], [place]   [phone] or [phone]   [facility] Behavioral   [address] [place], [place] [phone]   [facility] [address] [place], [place] [phone] Insurance/Medicaid/sponsorship through [employer] and Families [address]                                    [place], [place] [phone] Therapy/tele-psych/case  [facility] [address] [place], [place] [phone]  Dr. Adele Schilder  [phone]   [facility] Dept. 1) [address], [place] 2) [place] 3)  [place] [address], [place] [phone]  [phone]   [place] [phone] or [phone] (After Hours)

## 2015-12-22 NOTE — ED Provider Notes (Signed)
TIME SEEN: 3:00 AM  CHIEF COMPLAINT: "I have a bump on the side of my tongue"  HPI: Pt is a 55 y.o. female with history of diabetes, hypertension, bipolar disorder, substance abuse who presents to the emergency department complains of a sore placed or Tegretol in the past several days. She states this area started off as a small bump and she tried to pop it with a safety pin. Now it appears to be a small ulcer and she reports it is hurting. No drainage. No dental pain. No fever. Was recently here for evaluation for STD screening and she has had multiple sexual partners within the last month in exchange for drugs. He denies history of herpes. Was recently here on 12/02/15 and had negative HIV, syphilis, gonorrhea and chlamydia. Was positive for Trichomonas but was given 2 g of oral Flagyl. Was previously in [facility] but states she is out of rehabilitation at this time.  ROS: See HPI Constitutional: no fever  Eyes: no drainage  ENT: no runny nose   Cardiovascular:  no chest pain  Resp: no SOB  GI: no vomiting GU: no dysuria Integumentary: no rash  Allergy: no hives  Musculoskeletal: no leg swelling  Neurological: no slurred speech ROS otherwise negative  PAST MEDICAL HISTORY/PAST SURGICAL HISTORY:  Past Medical History  Diagnosis Date  . Diabetes mellitus without complication (HCC)   . Hypertension   . Asthma   . Arthritis   . Bipolar 1 disorder, depressed (HCC)   . Substance abuse     MEDICATIONS:  Prior to Admission medications   Medication Sig Start Date End Date Taking? Authorizing Provider  amoxicillin (AMOXIL) 500 MG capsule Take 1 capsule (500 mg total) by mouth 3 (three) times daily. Patient not taking: Reported on 06/14/2015 05/25/15   Adonis Brook, NP  benztropine (COGENTIN) 1 MG tablet Take 1 tablet (1 mg total) by mouth at bedtime. 05/25/15   Adonis Brook, NP  citalopram (CELEXA) 40 MG tablet Take 1 tablet (40 mg total) by mouth daily. 05/25/15   Adonis Brook, NP   doxycycline (VIBRAMYCIN) 100 MG capsule Take 1 capsule (100 mg total) by mouth 2 (two) times daily. One po bid x 7 days 12/02/15   Joycie Peek, PA-C  haloperidol (HALDOL) 5 MG tablet Take 1 tablet (5 mg total) by mouth at bedtime. 05/25/15   Adonis Brook, NP  insulin aspart (NOVOLOG) 100 UNIT/ML injection Inject 4-15 Units into the skin daily. Diabetes management 05/25/15   Adonis Brook, NP  insulin glargine (LANTUS) 100 UNIT/ML injection Inject 0.25 mLs (25 Units total) into the skin at bedtime. 05/25/15   Adonis Brook, NP  lisinopril (PRINIVIL,ZESTRIL) 10 MG tablet Take 1 tablet (10 mg total) by mouth daily. 05/25/15   Adonis Brook, NP  metFORMIN (GLUCOPHAGE) 1000 MG tablet Take 1 tablet (1,000 mg total) by mouth 2 (two) times daily with a meal. 05/25/15   Adonis Brook, NP  nicotine (NICODERM CQ - DOSED IN MG/24 HOURS) 21 mg/24hr patch Place 1 patch (21 mg total) onto the skin daily at 6 (six) AM. Patient not taking: Reported on 06/14/2015 05/25/15   Adonis Brook, NP  pantoprazole (PROTONIX) 40 MG tablet Take 1 tablet (40 mg total) by mouth daily. Patient not taking: Reported on 06/14/2015 05/25/15   Adonis Brook, NP  simvastatin (ZOCOR) 40 MG tablet Take 1 tablet (40 mg total) by mouth every evening. 05/25/15   Adonis Brook, NP  traZODone (DESYREL) 100 MG tablet Take 1 tablet (100 mg total) by  mouth at bedtime. 05/25/15   Adonis Brook, NP    ALLERGIES:  Allergies  Allergen Reactions  . Aspirin Anaphylaxis and Swelling    SOCIAL HISTORY:  Social History  Substance Use Topics  . Smoking status: Current Some Day Smoker -- 1.00 packs/day    Types: Cigarettes  . Smokeless tobacco: Never Used  . Alcohol Use: Yes     Comment: three to four 40oz beers daily    FAMILY HISTORY: No family history on file.  EXAM: BP 175/106 mmHg  Pulse 96  Temp(Src) 98 F (36.7 C) (Oral)  Resp 18  Ht 5' 5.5" (1.664 m)  Wt 140 lb (63.504 kg)  BMI 22.93 kg/m2  SpO2 100%  LMP  06/30/2013 CONSTITUTIONAL: Alert and oriented and responds appropriately to questions. Well-appearing; well-nourished HEAD: Normocephalic EYES: Conjunctivae clear, PERRL ENT: normal nose; no rhinorrhea; moist mucous membranes; pharynx without lesions noted; no tonsillar hypertrophy or exudate, no uvular deviation, no trismus or drooling, normal phonation, no stridor, no dental abscess noted, patient does have several dental caries, no dental tenderness, no Ludwig's angina, tongue sits flat in the bottom of the mouth; patient has a 2-3 mm ulcerated lesion to the lateral aspect of the right eye, no other masses or lesions appreciated on the tongue or mouth NECK: Supple, no meningismus, no LAD  CARD: RRR; S1 and S2 appreciated; no murmurs, no clicks, no rubs, no gallops RESP: Normal chest excursion without splinting or tachypnea; breath sounds clear and equal bilaterally; no wheezes, no rhonchi, no rales, no hypoxia or respiratory distress, speaking full sentences ABD/GI: Normal bowel sounds; non-distended; soft, non-tender, no rebound, no guarding, no peritoneal signs BACK:  The back appears normal and is non-tender to palpation, there is no CVA tenderness EXT: Normal ROM in all joints; non-tender to palpation; no edema; normal capillary refill; no cyanosis, no calf tenderness or swelling    SKIN: Normal color for age and race; warm, no rash on her palms or soles, no blisters or desquamation, no other rash NEURO: Moves all extremities equally, sensation to light touch intact diffusely, cranial nerves II through XII intact PSYCH: The patient's mood and manner are appropriate. Grooming and personal hygiene are appropriate.  MEDICAL DECISION MAKING: Pt here with what appears to be an abscess ulcer. This started off as what sounds like lingual papillitis that she irritated by sticking a safety pin in it. There is no sign of abscess or other infection. Given her history of multiple recent sexual partners  discussed with patient that this could be herpes. We have sent a herpes culture.  Given her prescription for acyclovir and have told her to hold off on this medication unless she is contacted at her herpes screen is positive or she begins developing multiple other lesions. Will discharge with viscous lidocaine to swish and spit for pain control. Also advised to alternate Tylenol and Motrin. I also recommended that she quit smoking as this could lead to oral lesions, oral cancer. There are no masses or signs of cancer currently. No other lesions in the mouth or tongue. This does not appear to be hand, foot, mouth disease and she has no lesions on her palms or soles, no fever. Doubt TEN or SJS given this is an isolated lesion and there is no blisters, desquamation. She does have a primary care provider. I discussed with her physician not improving in the next 1-2 weeks she should follow up with her PCP.Discussed return precautions. She verbalizes understanding and is comfortable with  this plan.      Layla Maw Ward, DO 12/22/15 [phone]

## 2015-12-23 LAB — HERPES SIMPLEX VIRUS CULTURE: Culture: NOT DETECTED

## 2016-01-06 ENCOUNTER — Emergency Department (HOSPITAL_BASED_OUTPATIENT_CLINIC_OR_DEPARTMENT_OTHER): Payer: Medicaid Other

## 2016-01-06 ENCOUNTER — Encounter (HOSPITAL_BASED_OUTPATIENT_CLINIC_OR_DEPARTMENT_OTHER): Payer: Self-pay | Admitting: *Deleted

## 2016-01-06 ENCOUNTER — Emergency Department (HOSPITAL_BASED_OUTPATIENT_CLINIC_OR_DEPARTMENT_OTHER)
Admission: EM | Admit: 2016-01-06 | Discharge: 2016-01-06 | Disposition: A | Discharge status: Home / Self Care | Payer: Medicaid Other | Attending: Emergency Medicine | Admitting: Emergency Medicine

## 2016-01-06 DIAGNOSIS — Z79899 Other long term (current) drug therapy: Secondary | ICD-10-CM | POA: Insufficient documentation

## 2016-01-06 DIAGNOSIS — Y9289 Other specified places as the place of occurrence of the external cause: Secondary | ICD-10-CM | POA: Insufficient documentation

## 2016-01-06 DIAGNOSIS — S62630A Displaced fracture of distal phalanx of right index finger, initial encounter for closed fracture: Secondary | ICD-10-CM | POA: Insufficient documentation

## 2016-01-06 DIAGNOSIS — F319 Bipolar disorder, unspecified: Secondary | ICD-10-CM | POA: Insufficient documentation

## 2016-01-06 DIAGNOSIS — Y9389 Activity, other specified: Secondary | ICD-10-CM | POA: Insufficient documentation

## 2016-01-06 DIAGNOSIS — F17210 Nicotine dependence, cigarettes, uncomplicated: Secondary | ICD-10-CM | POA: Insufficient documentation

## 2016-01-06 DIAGNOSIS — E119 Type 2 diabetes mellitus without complications: Secondary | ICD-10-CM | POA: Insufficient documentation

## 2016-01-06 DIAGNOSIS — J45909 Unspecified asthma, uncomplicated: Secondary | ICD-10-CM | POA: Insufficient documentation

## 2016-01-06 DIAGNOSIS — W01198A Fall on same level from slipping, tripping and stumbling with subsequent striking against other object, initial encounter: Secondary | ICD-10-CM | POA: Insufficient documentation

## 2016-01-06 DIAGNOSIS — S62600A Fracture of unspecified phalanx of right index finger, initial encounter for closed fracture: Secondary | ICD-10-CM

## 2016-01-06 DIAGNOSIS — M1990 Unspecified osteoarthritis, unspecified site: Secondary | ICD-10-CM | POA: Insufficient documentation

## 2016-01-06 DIAGNOSIS — Z794 Long term (current) use of insulin: Secondary | ICD-10-CM | POA: Insufficient documentation

## 2016-01-06 DIAGNOSIS — I10 Essential (primary) hypertension: Secondary | ICD-10-CM | POA: Insufficient documentation

## 2016-01-06 DIAGNOSIS — Z7984 Long term (current) use of oral hypoglycemic drugs: Secondary | ICD-10-CM | POA: Insufficient documentation

## 2016-01-06 DIAGNOSIS — Y998 Other external cause status: Secondary | ICD-10-CM | POA: Insufficient documentation

## 2016-01-06 MED ORDER — HYDROCODONE-ACETAMINOPHEN 5-325 MG PO TABS
1.0000 | ORAL_TABLET | Freq: Once | ORAL | Status: AC
  Administered 2016-01-06: 1 via ORAL
  Filled 2016-01-06: qty 1

## 2016-01-06 MED ORDER — HYDROCODONE-ACETAMINOPHEN 5-325 MG PO TABS: 1.0000 | ORAL_TABLET | ORAL | Status: DC | PRN

## 2016-01-06 NOTE — ED Provider Notes (Signed)
CSN: [id number]     Arrival date & time 01/06/16  1106 History   First MD Initiated Contact with Patient 01/06/16 1129     Chief Complaint  Patient presents with  . Hand Pain     (Consider location/radiation/quality/duration/timing/severity/associated sxs/prior Treatment) The history is provided by the patient and medical records.    55 year old female with history of diabetes, hypertension, asthma, arthritis, bipolar disorder, substance abuse, presenting to the ED for right index finger injury. Patient reports that she fell on her finger approximately 2 weeks ago. She states her finger bent awkwardly. She began to have pain afterwards, but thought it was just "jammed".  She states she has had persistent pain of the right index finger, mostly with bending her right finger. She denies any numbness or weakness. Patient is right-hand dominant. No prior hand injuries or surgeries.  Past Medical History  Diagnosis Date  . Diabetes mellitus without complication (HCC)   . Hypertension   . Asthma   . Arthritis   . Bipolar 1 disorder, depressed (HCC)   . Substance abuse    Past Surgical History  Procedure Laterality Date  . C section     No family history on file. Social History  Substance Use Topics  . Smoking status: Current Some Day Smoker -- 1.00 packs/day    Types: Cigarettes  . Smokeless tobacco: Never Used  . Alcohol Use: Yes     Comment: three to four 40oz beers daily   OB History    No data available     Review of Systems  Musculoskeletal: Positive for arthralgias.  All other systems reviewed and are negative.     Allergies  Aspirin  Home Medications   Prior to Admission medications   Medication Sig Start Date End Date Taking? Authorizing Provider  acyclovir (ZOVIRAX) 400 MG tablet Take 1 tablet (400 mg total) by mouth 3 (three) times daily. 12/22/15   Kristen N Ward, DO  benztropine (COGENTIN) 1 MG tablet Take 1 tablet (1 mg total) by mouth at bedtime. 05/25/15    Adonis Brook, NP  citalopram (CELEXA) 40 MG tablet Take 1 tablet (40 mg total) by mouth daily. 05/25/15   Adonis Brook, NP  doxycycline (VIBRAMYCIN) 100 MG capsule Take 1 capsule (100 mg total) by mouth 2 (two) times daily. One po bid x 7 days 12/02/15   Joycie Peek, PA-C  haloperidol (HALDOL) 5 MG tablet Take 1 tablet (5 mg total) by mouth at bedtime. 05/25/15   Adonis Brook, NP  insulin aspart (NOVOLOG) 100 UNIT/ML injection Inject 4-15 Units into the skin daily. Diabetes management 05/25/15   Adonis Brook, NP  insulin glargine (LANTUS) 100 UNIT/ML injection Inject 0.25 mLs (25 Units total) into the skin at bedtime. 05/25/15   Adonis Brook, NP  lidocaine (XYLOCAINE) 2 % solution Use as directed 10 mLs in the mouth or throat every 4 (four) hours as needed for mouth pain. Swish in mouth until tongue is numb and then spit out 12/22/15   Kristen N Ward, DO  lisinopril (PRINIVIL,ZESTRIL) 10 MG tablet Take 1 tablet (10 mg total) by mouth daily. 05/25/15   Adonis Brook, NP  metFORMIN (GLUCOPHAGE) 1000 MG tablet Take 1 tablet (1,000 mg total) by mouth 2 (two) times daily with a meal. 05/25/15   Adonis Brook, NP  simvastatin (ZOCOR) 40 MG tablet Take 1 tablet (40 mg total) by mouth every evening. 05/25/15   Adonis Brook, NP  traZODone (DESYREL) 100 MG tablet Take 1 tablet (100 mg  total) by mouth at bedtime. 05/25/15   Adonis Brook, NP   BP 176/105 mmHg  Pulse 78  Temp(Src) 98.1 F (36.7 C) (Oral)  Resp 18  Ht 5' 4.5" (1.638 m)  Wt 63.504 kg  BMI 23.67 kg/m2  SpO2 100%  LMP 06/30/2013   Physical Exam  Constitutional: She is oriented to person, place, and time. She appears well-developed and well-nourished.  HENT:  Head: Normocephalic and atraumatic.  Mouth/Throat: Oropharynx is clear and moist.  Eyes: Conjunctivae and EOM are normal. Pupils are equal, round, and reactive to light.  Neck: Normal range of motion.  Cardiovascular: Normal rate, regular rhythm and normal heart sounds.    Pulmonary/Chest: Effort normal and breath sounds normal.  Abdominal: Soft. Bowel sounds are normal.  Musculoskeletal: Normal range of motion.  Right index finger with tenderness along middle phalanx, mostly dorsal aspect; mild swelling without gross deformity; DIP joint mildly flexed at rest but is able to extend it when prompted; full flexion/extension of other joint spaces; normal cap refill  Neurological: She is alert and oriented to person, place, and time.  Skin: Skin is warm and dry.  Psychiatric: She has a normal mood and affect.  Nursing note and vitals reviewed.   ED Course  ORTHOPEDIC INJURY TREATMENT Date/Time: 01/06/2016 12:22 PM Performed by: Garlon Hatchet Authorized by: Garlon Hatchet Consent: Verbal consent obtained. Risks and benefits: risks, benefits and alternatives were discussed Consent given by: patient Patient understanding: patient states understanding of the procedure being performed Required items: required blood products, implants, devices, and special equipment available Patient identity confirmed: verbally with patient Injury location: finger Location details: right index finger Injury type: fracture Fracture type: middle phalanx MCP joint involved: no Any IP joint involved: yes Pre-procedure neurovascular assessment: neurovascularly intact Immobilization: splint Splint type: static finger Supplies used: aluminum splint and cotton padding Post-procedure neurovascular assessment: post-procedure neurovascularly intact Patient tolerance: Patient tolerated the procedure well with no immediate complications   (including critical care time) Labs Review Labs Reviewed - No data to display  Imaging Review Dg Finger Index Right  01/06/2016  CLINICAL DATA:  Fall 1 week prior with persistent pain and swelling EXAM: RIGHT SECOND FINGER 2+V COMPARISON:  None. FINDINGS: Frontal, oblique, and lateral views were obtained. There is an obliquely oriented mildly  displaced fracture of the distal aspect of the second middle phalanx with fracture extending into the lateral aspect of the second DIP joint. No other fracture. No dislocation. The joint spaces appear intact. IMPRESSION: Slightly displaced fracture distal aspect second middle phalanx with fracture extending into the lateral aspect of the second DIP joint. No other fracture. No appreciable arthropathy. Electronically Signed   By: Bretta Bang III M.D.   On: 01/06/2016 11:38   I have personally reviewed and evaluated these images and lab results as part of my medical decision-making.   EKG Interpretation None      MDM   Final diagnoses:  Fracture of phalanx of right index finger, closed, initial encounter   55 year old female here with right index finger injury that occurred 2 weeks ago. She has mild swelling and tenderness, mostly along the right index finger middle phalanx. Her DIP joint is mildly flexed at rest, however she is able to extend when prompted. Her hand is neurovascularly intact. X-ray does confirm displaced fracture of distal aspect of the second middle phalanx extending into the joint space.  Given that this injury occurred 2 weeks ago do not feel emergent hand consultation needed, however  i have encouraged patient to follow-up as an OP.  Finger placed in static splint, vicodin for pain.  Discussed plan with patient, he/she acknowledged understanding and agreed with plan of care.  Return precautions given for new or worsening symptoms.  Garlon Hatchet, PA-C 01/06/16 1225  Lavera Guise, MD 01/06/16 2109

## 2016-01-06 NOTE — Discharge Instructions (Signed)
Take the prescribed medication as directed. Follow-up with Dr. Merlyn Lot-- call to make appt. Return to the ED for new or worsening symptoms.

## 2016-01-06 NOTE — ED Notes (Signed)
States she fell and hit her right 2nd digit a 2 weeks ago. Pain in her finger since.

## 2016-01-06 NOTE — ED Notes (Signed)
Pt directed to pharmacy to pick up prescriptions. Pt states her daughter is driving

## 2016-04-11 ENCOUNTER — Emergency Department (HOSPITAL_BASED_OUTPATIENT_CLINIC_OR_DEPARTMENT_OTHER): Payer: Self-pay

## 2016-04-11 ENCOUNTER — Emergency Department (HOSPITAL_BASED_OUTPATIENT_CLINIC_OR_DEPARTMENT_OTHER)
Admission: EM | Admit: 2016-04-11 | Discharge: 2016-04-11 | Disposition: A | Discharge status: Other Acute Inpatient Hospital | Payer: Self-pay | Attending: Emergency Medicine | Admitting: Emergency Medicine

## 2016-04-11 ENCOUNTER — Encounter (HOSPITAL_BASED_OUTPATIENT_CLINIC_OR_DEPARTMENT_OTHER): Payer: Self-pay

## 2016-04-11 DIAGNOSIS — J45909 Unspecified asthma, uncomplicated: Secondary | ICD-10-CM | POA: Insufficient documentation

## 2016-04-11 DIAGNOSIS — Z794 Long term (current) use of insulin: Secondary | ICD-10-CM | POA: Insufficient documentation

## 2016-04-11 DIAGNOSIS — Z79899 Other long term (current) drug therapy: Secondary | ICD-10-CM | POA: Insufficient documentation

## 2016-04-11 DIAGNOSIS — E119 Type 2 diabetes mellitus without complications: Secondary | ICD-10-CM | POA: Insufficient documentation

## 2016-04-11 DIAGNOSIS — N12 Tubulo-interstitial nephritis, not specified as acute or chronic: Secondary | ICD-10-CM | POA: Insufficient documentation

## 2016-04-11 DIAGNOSIS — F319 Bipolar disorder, unspecified: Secondary | ICD-10-CM | POA: Insufficient documentation

## 2016-04-11 DIAGNOSIS — F17210 Nicotine dependence, cigarettes, uncomplicated: Secondary | ICD-10-CM | POA: Insufficient documentation

## 2016-04-11 DIAGNOSIS — I10 Essential (primary) hypertension: Secondary | ICD-10-CM | POA: Insufficient documentation

## 2016-04-11 DIAGNOSIS — Z7984 Long term (current) use of oral hypoglycemic drugs: Secondary | ICD-10-CM | POA: Insufficient documentation

## 2016-04-11 HISTORY — DX: Alcohol abuse, uncomplicated: F10.10

## 2016-04-11 HISTORY — DX: Cocaine use, unspecified, uncomplicated: F14.90

## 2016-04-11 LAB — URINALYSIS, ROUTINE W REFLEX MICROSCOPIC
BILIRUBIN URINE: NEGATIVE
Glucose, UA: >1000 mg/dL — AB
KETONES UR: 40 mg/dL — AB
NITRITE: NEGATIVE
Protein, ur: 30 mg/dL — AB
Specific Gravity, Urine: 1.028 (ref 1.005–1.030)
pH: 6 (ref 5.0–8.0)

## 2016-04-11 LAB — URINE MICROSCOPIC-ADD ON

## 2016-04-11 LAB — CBC
HCT: 33 % — ABNORMAL LOW (ref 36.0–46.0)
Hemoglobin: 11.2 g/dL — ABNORMAL LOW (ref 12.0–15.0)
MCH: 26.7 pg (ref 26.0–34.0)
MCHC: 33.9 g/dL (ref 30.0–36.0)
MCV: 78.6 fL (ref 78.0–100.0)
PLATELETS: 341 10*3/uL (ref 150–400)
RBC: 4.2 MIL/uL (ref 3.87–5.11)
RDW: 16.4 % — ABNORMAL HIGH (ref 11.5–15.5)
WBC: 23.5 10*3/uL — ABNORMAL HIGH (ref 4.0–10.5)

## 2016-04-11 LAB — COMPREHENSIVE METABOLIC PANEL
ALBUMIN: 3.3 g/dL — AB (ref 3.5–5.0)
ALT: 14 U/L (ref 14–54)
ANION GAP: 13 (ref 5–15)
AST: 13 U/L — ABNORMAL LOW (ref 15–41)
Alkaline Phosphatase: 139 U/L — ABNORMAL HIGH (ref 38–126)
BUN: 14 mg/dL (ref 6–20)
CHLORIDE: 93 mmol/L — AB (ref 101–111)
CO2: 22 mmol/L (ref 22–32)
Calcium: 8.9 mg/dL (ref 8.9–10.3)
Creatinine, Ser: 0.89 mg/dL (ref 0.44–1.00)
GFR calc non Af Amer: >60 mL/min (ref >60)
GLUCOSE: 488 mg/dL — AB (ref 65–99)
Potassium: 3.8 mmol/L (ref 3.5–5.1)
SODIUM: 128 mmol/L — AB (ref 135–145)
Total Bilirubin: 0.9 mg/dL (ref 0.3–1.2)
Total Protein: 8.1 g/dL (ref 6.5–8.1)

## 2016-04-11 LAB — CBG MONITORING, ED
GLUCOSE-CAPILLARY: 541 mg/dL — AB (ref 65–99)
Glucose-Capillary: 275 mg/dL — ABNORMAL HIGH (ref 65–99)

## 2016-04-11 LAB — LIPASE, BLOOD: Lipase: 21 U/L (ref 11–51)

## 2016-04-11 LAB — I-STAT CG4 LACTIC ACID, ED: LACTIC ACID, VENOUS: 1.93 mmol/L (ref 0.5–2.0)

## 2016-04-11 MED ORDER — SODIUM CHLORIDE 0.9 % IV BOLUS (SEPSIS)
1000.0000 mL | Freq: Once | INTRAVENOUS | Status: AC
Start: 2016-04-11 — End: 2016-04-11
  Administered 2016-04-11: 1000 mL via INTRAVENOUS

## 2016-04-11 MED ORDER — SODIUM CHLORIDE 0.9 % IV BOLUS (SEPSIS)
1000.0000 mL | Freq: Once | INTRAVENOUS | Status: AC
  Administered 2016-04-11: 1000 mL via INTRAVENOUS

## 2016-04-11 MED ORDER — IOPAMIDOL (ISOVUE-300) INJECTION 61%
100.0000 mL | Freq: Once | INTRAVENOUS | Status: AC | PRN
  Administered 2016-04-11: 100 mL via INTRAVENOUS

## 2016-04-11 MED ORDER — HYDROMORPHONE HCL 1 MG/ML IJ SOLN
0.5000 mg | Freq: Once | INTRAMUSCULAR | Status: AC
  Administered 2016-04-11: 0.5 mg via INTRAVENOUS
  Filled 2016-04-11: qty 1

## 2016-04-11 MED ORDER — MORPHINE SULFATE (PF) 4 MG/ML IV SOLN
4.0000 mg | Freq: Once | INTRAVENOUS | Status: AC
  Administered 2016-04-11: 4 mg via INTRAVENOUS
  Filled 2016-04-11: qty 1

## 2016-04-11 MED ORDER — CEPHALEXIN 500 MG PO CAPS: 500.0000 mg | ORAL_CAPSULE | Freq: Four times a day (QID) | ORAL | Status: DC

## 2016-04-11 MED ORDER — DEXTROSE 5 % IV SOLN
1.0000 g | Freq: Once | INTRAVENOUS | Status: AC
  Administered 2016-04-11: 1 g via INTRAVENOUS
  Filled 2016-04-11: qty 10

## 2016-04-11 NOTE — ED Notes (Addendum)
Pt walking around lobby, NAD.  No coughing.

## 2016-04-11 NOTE — Discharge Instructions (Signed)
Please read and follow all provided instructions.  Your diagnoses today include:  1. Pyelonephritis    Tests performed today include:  Vital signs. See below for your results today.   Medications prescribed:   Take as prescribed   Home care instructions:  Follow any educational materials contained in this packet.  Follow-up instructions: Please follow-up with your primary care provider in the next week for further evaluation of symptoms and treatment   Return instructions:   Please return to the Emergency Department if you do not get better, if you get worse, or new symptoms OR  - Fever (temperature greater than 101.54F)  - Bleeding that does not stop with holding pressure to the area    -Severe pain (please note that you may be more sore the day after your accident)  - Chest Pain  - Difficulty breathing  - Severe nausea or vomiting  - Inability to tolerate food and liquids  - Passing out  - Skin becoming red around your wounds  - Change in mental status (confusion or lethargy)  - New numbness or weakness     Please return if you have any other emergent concerns.  Additional Information:  Your vital signs today were: BP 160/102 mmHg   Pulse 115   Temp(Src) 98.6 F (37 C) (Oral)   Resp 20   Ht 5' 4.5" (1.638 m)   Wt 56.7 kg   BMI 21.13 kg/m2   SpO2 99%   LMP 06/30/2013 If your blood pressure (BP) was elevated above 135/85 this visit, please have this repeated by your doctor within one month. ---------------

## 2016-04-11 NOTE — ED Notes (Addendum)
C/o abd pain, nausea, urinary incontinence x 1 week

## 2016-04-11 NOTE — ED Provider Notes (Signed)
CSN: [id number]     Arrival date & time 04/11/16  1627 History   First MD Initiated Contact with Patient 04/11/16 1702     Chief Complaint  Patient presents with  . Abdominal Pain   (Consider location/radiation/quality/duration/timing/severity/associated sxs/prior Treatment) HPI 55 y.o. female with a hx of HTN, DM, Bipolar, ETOH Abuse, Substance Abuse, presents to the Emergency Department today complaining of abdominal pain and cough x 1-2 weeks. Notes pain is 9/10 and generalized abdomen. Notes cough as well with Shortness of breath for the past 2 weeks as well. Associated rhinorrhea, sore throat, congestion, and sick contacts. No fevers. Notes nausea with no vomiting. No headaches. No vision changes. Has tried Ibuprofen with minimal relief. Notes decrease in PO intake due to the abdominal pain as well. Notes dysuria with no hematuria. No vaginal bleeding or discharge.    Past Medical History  Diagnosis Date  . Diabetes mellitus without complication (HCC)   . Hypertension   . Asthma   . Arthritis   . Bipolar 1 disorder, depressed (HCC)   . Substance abuse   . ETOH abuse   . Crack cocaine use    Past Surgical History  Procedure Laterality Date  . C section     No family history on file. Social History  Substance Use Topics  . Smoking status: Current Some Day Smoker -- 1.00 packs/day    Types: Cigarettes  . Smokeless tobacco: Never Used  . Alcohol Use: Yes     Comment: occ   OB History    No data available     Review of Systems ROS reviewed and all are negative for acute change except as noted in the HPI.  Allergies  Aspirin  Home Medications   Prior to Admission medications   Medication Sig Start Date End Date Taking? Authorizing Provider  acyclovir (ZOVIRAX) 400 MG tablet Take 1 tablet (400 mg total) by mouth 3 (three) times daily. 12/22/15   Kristen N Ward, DO  benztropine (COGENTIN) 1 MG tablet Take 1 tablet (1 mg total) by mouth at bedtime. 05/25/15   Adonis Brook, NP  citalopram (CELEXA) 40 MG tablet Take 1 tablet (40 mg total) by mouth daily. 05/25/15   Adonis Brook, NP  doxycycline (VIBRAMYCIN) 100 MG capsule Take 1 capsule (100 mg total) by mouth 2 (two) times daily. One po bid x 7 days 12/02/15   Joycie Peek, PA-C  haloperidol (HALDOL) 5 MG tablet Take 1 tablet (5 mg total) by mouth at bedtime. 05/25/15   Adonis Brook, NP  HYDROcodone-acetaminophen (NORCO/VICODIN) 5-325 MG tablet Take 1 tablet by mouth every 4 (four) hours as needed. 01/06/16   Garlon Hatchet, PA-C  insulin aspart (NOVOLOG) 100 UNIT/ML injection Inject 4-15 Units into the skin daily. Diabetes management 05/25/15   Adonis Brook, NP  insulin glargine (LANTUS) 100 UNIT/ML injection Inject 0.25 mLs (25 Units total) into the skin at bedtime. 05/25/15   Adonis Brook, NP  lidocaine (XYLOCAINE) 2 % solution Use as directed 10 mLs in the mouth or throat every 4 (four) hours as needed for mouth pain. Swish in mouth until tongue is numb and then spit out 12/22/15   Kristen N Ward, DO  lisinopril (PRINIVIL,ZESTRIL) 10 MG tablet Take 1 tablet (10 mg total) by mouth daily. 05/25/15   Adonis Brook, NP  metFORMIN (GLUCOPHAGE) 1000 MG tablet Take 1 tablet (1,000 mg total) by mouth 2 (two) times daily with a meal. 05/25/15   Adonis Brook, NP  simvastatin (ZOCOR) 40  MG tablet Take 1 tablet (40 mg total) by mouth every evening. 05/25/15   Adonis BrookSheila Agustin, NP  traZODone (DESYREL) 100 MG tablet Take 1 tablet (100 mg total) by mouth at bedtime. 05/25/15   Adonis BrookSheila Agustin, NP   BP 160/102 mmHg  Pulse 115  Temp(Src) 98.6 F (37 C) (Oral)  Resp 20  Ht 5' 4.5" (1.638 m)  Wt 56.7 kg  BMI 21.13 kg/m2  SpO2 99%  LMP 06/30/2013   Physical Exam  Constitutional: She is oriented to person, place, and time. She appears well-developed and well-nourished.  HENT:  Head: Normocephalic and atraumatic.  Eyes: EOM are normal. Pupils are equal, round, and reactive to light.  Neck: Normal range of motion.  Neck supple. No tracheal deviation present.  Cardiovascular: Normal rate, regular rhythm, normal heart sounds and intact distal pulses.   No murmur heard. Pulmonary/Chest: Effort normal and breath sounds normal. No respiratory distress. She has no wheezes. She has no rales. She exhibits no tenderness.  Abdominal: Soft. Normal appearance and bowel sounds are normal. There is generalized tenderness. There is no rigidity, no rebound, no guarding, no CVA tenderness, no tenderness at McBurney's point and negative Murphy's sign.  Abdomen soft  Musculoskeletal: Normal range of motion.  Neurological: She is alert and oriented to person, place, and time.  Skin: Skin is warm and dry.  Psychiatric: She has a normal mood and affect. Her behavior is normal. Thought content normal.  Nursing note and vitals reviewed.  ED Course  Procedures (including critical care time) Labs Review Labs Reviewed  URINALYSIS, ROUTINE W REFLEX MICROSCOPIC (NOT [facility]) - Abnormal; Notable for the following:    APPearance CLOUDY (*)    Glucose, UA >1000 (*)    Hgb urine dipstick SMALL (*)    Ketones, ur 40 (*)    Protein, ur 30 (*)    Leukocytes, UA TRACE (*)    All other components within normal limits  CBC - Abnormal; Notable for the following:    WBC 23.5 (*)    Hemoglobin 11.2 (*)    HCT 33.0 (*)    RDW 16.4 (*)    All other components within normal limits  COMPREHENSIVE METABOLIC PANEL - Abnormal; Notable for the following:    Sodium 128 (*)    Chloride 93 (*)    Glucose, Bld 488 (*)    Albumin 3.3 (*)    AST 13 (*)    Alkaline Phosphatase 139 (*)    All other components within normal limits  URINE MICROSCOPIC-ADD ON - Abnormal; Notable for the following:    Squamous Epithelial / LPF 0-5 (*)    Bacteria, UA RARE (*)    All other components within normal limits  CBG MONITORING, ED - Abnormal; Notable for the following:    Glucose-Capillary 541 (*)    All other components within normal limits  LIPASE,  BLOOD  I-STAT CG4 LACTIC ACID, ED   Imaging Review Dg Chest 2 View  04/11/2016  CLINICAL DATA:  Cough for 2 weeks EXAM: CHEST  2 VIEW COMPARISON:  11/09/2013 FINDINGS: The heart size and mediastinal contours are within normal limits. Both lungs are clear. The visualized skeletal structures are unremarkable. IMPRESSION: No active cardiopulmonary disease. Electronically Signed   By: Alcide CleverMark  Lukens M.D.   On: 04/11/2016 17:57   Ct Abdomen Pelvis W Contrast  04/11/2016  CLINICAL DATA:  Lower abdominal pain for 4 days, nausea, constipation, dysuria, hypertension, diabetes mellitus, asthma, history of ethanol and crack cocaine abuse,  smoker EXAM: CT ABDOMEN AND PELVIS WITH CONTRAST TECHNIQUE: Multidetector CT imaging of the abdomen and pelvis was performed using the standard protocol following bolus administration of intravenous contrast. Sagittal and coronal MPR images reconstructed from axial data set. CONTRAST:  ISOVUE-300 IOPAMIDOL (ISOVUE-300) INJECTION 61% IV. Dilute oral contrast. COMPARISON:  02/14/2009 FINDINGS: Minimal dependent bibasilar atelectasis. Minimal inhomogeneity of nephrograms bilaterally, could potentially represent subtle pyelonephritis. Slight increased enhancement of the walls of the RIGHT renal pelvis also noted. Liver, gallbladder, spleen, pancreas, kidneys, and adrenal glands otherwise normal appearance. Stomach incompletely distended, unable to exclude wall thickening. Normal appendix. Large and small bowel loops grossly unremarkable. Leiomyomata within uterus some calcified. Unremarkable adnexa, bladder, and ureters. Umbilical hernia containing fat, unchanged. Scattered atherosclerotic calcifications. No mass, adenopathy, free air, free fluid, or inflammatory process. Bones unremarkable. IMPRESSION: Umbilical hernia containing fat. Subtle inhomogeneity of BILATERAL nephrograms, with enhancement of the wall of the RIGHT renal pelvis, cannot exclude urinary tract  infection/pyelonephritis; recommend correlation with urinalysis. Uterine leiomyomata. No other intra-abdominal or intrapelvic abnormalities. Electronically Signed   By: Ulyses Southward M.D.   On: 04/11/2016 20:38   I have personally reviewed and evaluated these images and lab results as part of my medical decision-making.   EKG Interpretation None      MDM  I have reviewed and evaluated the relevant laboratory values I have reviewed and evaluated the relevant imaging studies.  I have reviewed the relevant previous healthcare records. I obtained HPI from historian. Patient discussed with supervising physician  ED Course:  Assessment: Patient is a 55yF presents with abdominal pain x 1-2 weeks. On exam, nontoxic, nonseptic appearing, in no apparent distress. Patient's pain and other symptoms adequately managed in emergency department.  Fluid bolus 1L NS x2 given due to glucose 541. No gap. MiId ketones in UA.  Labs and vitals reviewed.  Lactate negative. WBC 23.5. Due to elevation, CT ordered, which showed questionable Pyelo with correlation with UTI. UA showed UTI. Will tx pyelo with ABX. Given 1 g Rocephin in ED. Patient does not meet the SIRS or Sepsis criteria.  Consulted with medicine at [facility] and agreed for admission.   Disposition/Plan:  Admit pt to [facility]  Pt acknowledges and agrees with plan  Supervising Physician Gwyneth Sprout, MD   Final diagnoses:  Pyelonephritis       Audry Pili, PA-C 04/11/16 1610  Gwyneth Sprout, MD 04/12/16 [id number]

## 2016-04-11 NOTE — ED Notes (Signed)
Patient transported to CT 

## 2016-05-30 IMAGING — CR DG FINGER INDEX 2+V*R*
3 series · 3 of 3 positions shown · non-contrast
Comparison: None.

CLINICAL DATA: Fall 1 week prior with persistent pain and swelling

EXAM:
RIGHT SECOND FINGER 2+V

[x finger pa right]
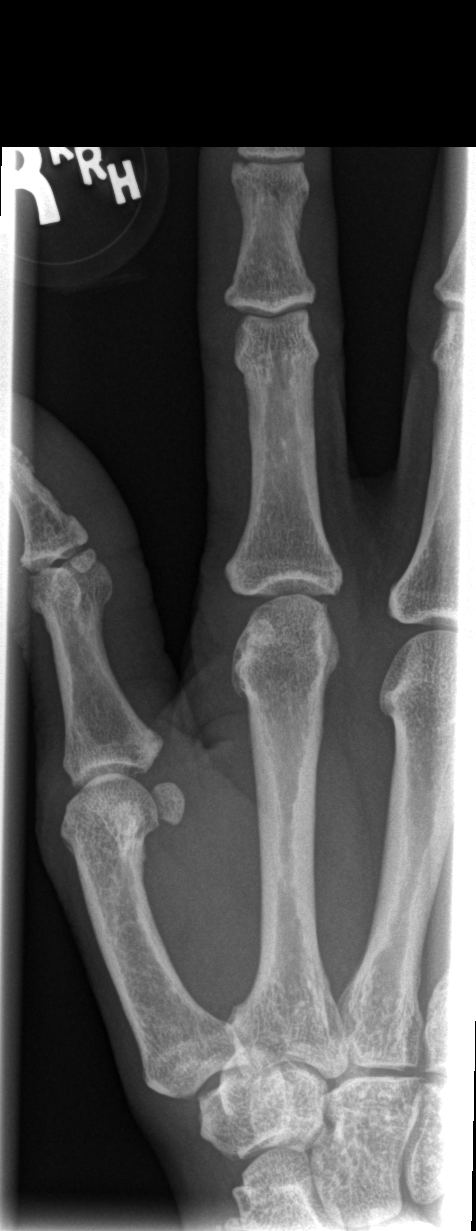

[x finger obl. right]
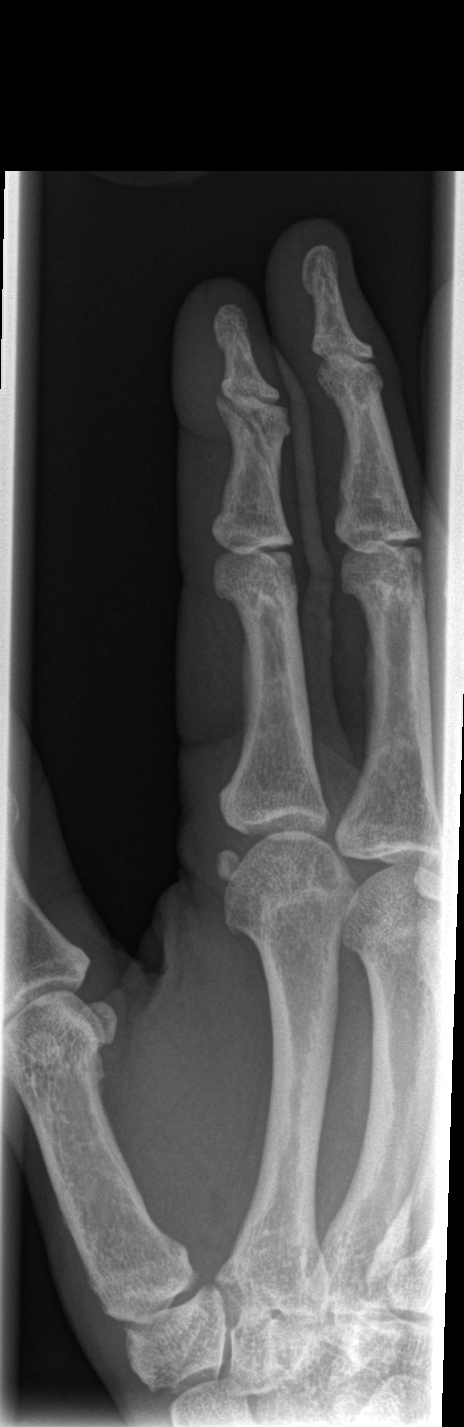

[x finger lateral right]
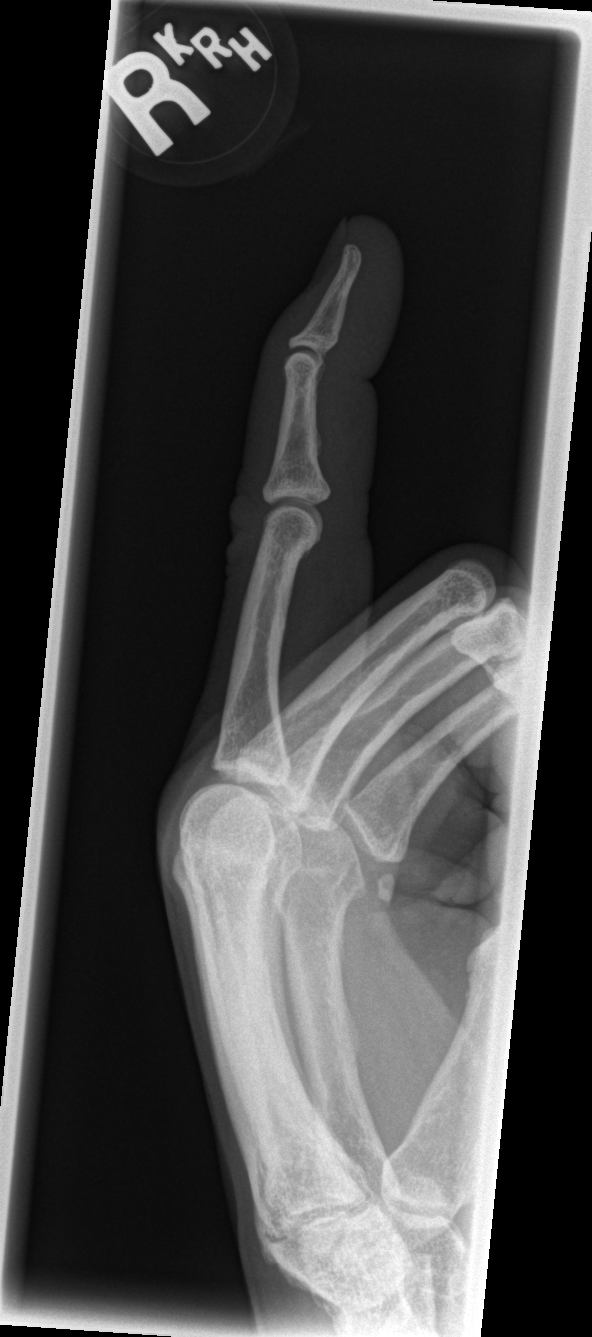

[3 of 3 positions shown; findings below may reference images not displayed]

FINDINGS: Frontal, oblique, and lateral views were obtained. There is an
obliquely oriented mildly displaced fracture of the distal aspect of
the second middle phalanx with fracture extending into the lateral
aspect of the second DIP joint. No other fracture. No dislocation.
The joint spaces appear intact.
IMPRESSION: Slightly displaced fracture distal aspect second middle phalanx with
fracture extending into the lateral aspect of the second DIP joint.
No other fracture. No appreciable arthropathy.

## 2018-04-01 ENCOUNTER — Encounter (HOSPITAL_BASED_OUTPATIENT_CLINIC_OR_DEPARTMENT_OTHER): Payer: Self-pay | Admitting: *Deleted

## 2018-04-01 ENCOUNTER — Emergency Department (HOSPITAL_BASED_OUTPATIENT_CLINIC_OR_DEPARTMENT_OTHER)
Admission: EM | Admit: 2018-04-01 | Discharge: 2018-04-01 | Disposition: A | Discharge status: Home / Self Care | Payer: Self-pay | Attending: Emergency Medicine | Admitting: Emergency Medicine

## 2018-04-01 ENCOUNTER — Emergency Department (HOSPITAL_BASED_OUTPATIENT_CLINIC_OR_DEPARTMENT_OTHER): Payer: Self-pay

## 2018-04-01 ENCOUNTER — Other Ambulatory Visit: Payer: Self-pay

## 2018-04-01 DIAGNOSIS — Z5321 Procedure and treatment not carried out due to patient leaving prior to being seen by health care provider: Secondary | ICD-10-CM | POA: Insufficient documentation

## 2018-04-01 DIAGNOSIS — R079 Chest pain, unspecified: Secondary | ICD-10-CM | POA: Insufficient documentation

## 2018-04-01 DIAGNOSIS — J029 Acute pharyngitis, unspecified: Secondary | ICD-10-CM | POA: Insufficient documentation

## 2018-04-01 DIAGNOSIS — R05 Cough: Secondary | ICD-10-CM | POA: Insufficient documentation

## 2018-04-01 LAB — RAPID STREP SCREEN (MED CTR MEBANE ONLY): STREPTOCOCCUS, GROUP A SCREEN (DIRECT): NEGATIVE

## 2018-04-01 NOTE — ED Triage Notes (Signed)
Sore throat, tongue is sore, productive cough with green sputum and chest soreness.

## 2018-04-04 LAB — CULTURE, GROUP A STREP (THRC)

## 2019-10-01 ENCOUNTER — Other Ambulatory Visit: Payer: Self-pay

## 2019-10-01 ENCOUNTER — Emergency Department (HOSPITAL_BASED_OUTPATIENT_CLINIC_OR_DEPARTMENT_OTHER)
Admission: EM | Admit: 2019-10-01 | Discharge: 2019-10-01 | Disposition: A | Discharge status: Home / Self Care | Payer: No Typology Code available for payment source | Attending: Emergency Medicine | Admitting: Emergency Medicine

## 2019-10-01 ENCOUNTER — Encounter (HOSPITAL_BASED_OUTPATIENT_CLINIC_OR_DEPARTMENT_OTHER): Payer: Self-pay

## 2019-10-01 DIAGNOSIS — Y92481 Parking lot as the place of occurrence of the external cause: Secondary | ICD-10-CM | POA: Diagnosis not present

## 2019-10-01 DIAGNOSIS — S199XXA Unspecified injury of neck, initial encounter: Secondary | ICD-10-CM | POA: Diagnosis present

## 2019-10-01 DIAGNOSIS — I10 Essential (primary) hypertension: Secondary | ICD-10-CM | POA: Insufficient documentation

## 2019-10-01 DIAGNOSIS — Z79899 Other long term (current) drug therapy: Secondary | ICD-10-CM | POA: Diagnosis not present

## 2019-10-01 DIAGNOSIS — S86811A Strain of other muscle(s) and tendon(s) at lower leg level, right leg, initial encounter: Secondary | ICD-10-CM | POA: Diagnosis not present

## 2019-10-01 DIAGNOSIS — E119 Type 2 diabetes mellitus without complications: Secondary | ICD-10-CM | POA: Insufficient documentation

## 2019-10-01 DIAGNOSIS — Y93I9 Activity, other involving external motion: Secondary | ICD-10-CM | POA: Diagnosis not present

## 2019-10-01 DIAGNOSIS — S46812A Strain of other muscles, fascia and tendons at shoulder and upper arm level, left arm, initial encounter: Secondary | ICD-10-CM | POA: Diagnosis not present

## 2019-10-01 DIAGNOSIS — Z794 Long term (current) use of insulin: Secondary | ICD-10-CM | POA: Insufficient documentation

## 2019-10-01 DIAGNOSIS — F17210 Nicotine dependence, cigarettes, uncomplicated: Secondary | ICD-10-CM | POA: Insufficient documentation

## 2019-10-01 DIAGNOSIS — S86911A Strain of unspecified muscle(s) and tendon(s) at lower leg level, right leg, initial encounter: Secondary | ICD-10-CM

## 2019-10-01 DIAGNOSIS — J45909 Unspecified asthma, uncomplicated: Secondary | ICD-10-CM | POA: Diagnosis not present

## 2019-10-01 DIAGNOSIS — Y999 Unspecified external cause status: Secondary | ICD-10-CM | POA: Diagnosis not present

## 2019-10-01 MED ORDER — ACETAMINOPHEN 500 MG PO TABS
1000.0000 mg | ORAL_TABLET | Freq: Once | ORAL | Status: AC
  Administered 2019-10-01: 1000 mg via ORAL
  Filled 2019-10-01: qty 2

## 2019-10-01 MED ORDER — OXYCODONE HCL 5 MG PO TABS
5.0000 mg | ORAL_TABLET | Freq: Once | ORAL | Status: AC
  Administered 2019-10-01: 5 mg via ORAL
  Filled 2019-10-01: qty 1

## 2019-10-01 MED ORDER — KETOROLAC TROMETHAMINE 15 MG/ML IJ SOLN
15.0000 mg | Freq: Once | INTRAMUSCULAR | Status: AC
  Administered 2019-10-01: 15 mg via INTRAMUSCULAR
  Filled 2019-10-01: qty 1
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
## 2019-10-01 NOTE — Discharge Instructions (Signed)
Take 4 over the counter ibuprofen tablets 3 times a day or 2 over-the-counter naproxen tablets twice a day for pain. Also take tylenol 1000mg(2 extra strength) four times a day.    
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
## 2020-02-11 ENCOUNTER — Encounter (HOSPITAL_BASED_OUTPATIENT_CLINIC_OR_DEPARTMENT_OTHER): Payer: Self-pay

## 2020-02-11 ENCOUNTER — Emergency Department (HOSPITAL_BASED_OUTPATIENT_CLINIC_OR_DEPARTMENT_OTHER)
Admission: EM | Admit: 2020-02-11 | Discharge: 2020-02-11 | Disposition: A | Discharge status: Home / Self Care | Payer: Medicaid Other | Attending: Emergency Medicine | Admitting: Emergency Medicine

## 2020-02-11 ENCOUNTER — Other Ambulatory Visit: Payer: Self-pay

## 2020-02-11 DIAGNOSIS — I10 Essential (primary) hypertension: Secondary | ICD-10-CM | POA: Insufficient documentation

## 2020-02-11 DIAGNOSIS — J45909 Unspecified asthma, uncomplicated: Secondary | ICD-10-CM | POA: Insufficient documentation

## 2020-02-11 DIAGNOSIS — Z794 Long term (current) use of insulin: Secondary | ICD-10-CM | POA: Insufficient documentation

## 2020-02-11 DIAGNOSIS — J069 Acute upper respiratory infection, unspecified: Secondary | ICD-10-CM

## 2020-02-11 DIAGNOSIS — Z79899 Other long term (current) drug therapy: Secondary | ICD-10-CM | POA: Insufficient documentation

## 2020-02-11 DIAGNOSIS — Z886 Allergy status to analgesic agent status: Secondary | ICD-10-CM | POA: Insufficient documentation

## 2020-02-11 DIAGNOSIS — L0390 Cellulitis, unspecified: Secondary | ICD-10-CM

## 2020-02-11 DIAGNOSIS — E119 Type 2 diabetes mellitus without complications: Secondary | ICD-10-CM | POA: Insufficient documentation

## 2020-02-11 DIAGNOSIS — F17210 Nicotine dependence, cigarettes, uncomplicated: Secondary | ICD-10-CM | POA: Insufficient documentation

## 2020-02-11 DIAGNOSIS — L03116 Cellulitis of left lower limb: Secondary | ICD-10-CM | POA: Insufficient documentation

## 2020-02-11 DIAGNOSIS — Z20822 Contact with and (suspected) exposure to covid-19: Secondary | ICD-10-CM | POA: Insufficient documentation

## 2020-02-11 HISTORY — DX: Schizoaffective disorder, bipolar type: F25.0

## 2020-02-11 LAB — SARS CORONAVIRUS 2 (TAT 6-24 HRS): SARS Coronavirus 2: NEGATIVE

## 2020-02-11 MED ORDER — AMOXICILLIN-POT CLAVULANATE 500-125 MG PO TABS: 1.0000 | ORAL_TABLET | Freq: Three times a day (TID) | ORAL | 0 refills | Status: DC

## 2020-02-11 MED ORDER — BACITRACIN ZINC 500 UNIT/GM EX OINT: TOPICAL_OINTMENT | Freq: Two times a day (BID) | CUTANEOUS | Status: DC

## 2020-02-11 MED ORDER — ACETAMINOPHEN 325 MG PO TABS
650.0000 mg | ORAL_TABLET | Freq: Once | ORAL | Status: AC
  Administered 2020-02-11: 650 mg via ORAL
  Filled 2020-02-11: qty 2

## 2020-02-11 NOTE — ED Triage Notes (Signed)
Pt states she scratched left LE ~2 weeks ago and now has a black area-also c/o prod cough x 1 month-NAD-steady gait

## 2020-02-11 NOTE — Discharge Instructions (Addendum)
Begin taking Augmentin as prescribed.  Local wound care with bacitracin and dressing changes twice daily.  Isolate at home until the results of your Covid test are known.  This will likely be within the next 24 hours.

## 2020-02-11 NOTE — ED Provider Notes (Signed)
[facility] EMERGENCY DEPARTMENT Provider Note   CSN: [id number] Arrival date & time: 02/11/20  1113     History Chief Complaint  Patient presents with  . Wound Check  . Cough    Diana Santos is a 59 y.o. female.  Patient is a 59 year old female with history of bipolar disorder, diabetes, hypertension.  She presents today for evaluation of a leg wound.  Patient struck her leg on a brick wall 2 weeks ago.  The wound has continued to enlarge and the patient is concerned about infection.  She also reports her right great toenail fell off several days ago.  Patient has neuropathy and denies experiencing any discomfort with this.  She also reports recent chest congestion and cough that has been ongoing for the past several weeks.  It is unrelieved with over-the-counter medications.  She denies chest pain, fevers, or chills.  She denies known exposures to COVID-19.  The history is provided by the patient.  Wound Check This is a new problem. The problem occurs constantly. The problem has been gradually worsening. Nothing aggravates the symptoms. Nothing relieves the symptoms.       Past Medical History:  Diagnosis Date  . Arthritis   . Asthma   . Bipolar 1 disorder, depressed ([place])   . Crack cocaine use   . Diabetes mellitus without complication ([place])   . ETOH abuse   . Hypertension   . Substance abuse [facility])     Patient Active Problem List   Diagnosis Date Noted  . Suicidal ideation   . Cocaine dependence with cocaine-induced mood disorder ([place])   . Severe recurrent major depressive disorder with psychotic features ([place])   . Substance abuse ([place])   . Alcohol use disorder, severe, dependence ([place]) 02/25/2015  . Diabetes ([place]) 04/08/2014  . Major depression 04/07/2014  . Alcohol dependence ([place]) 08/26/2013  . Cocaine dependence ([place]) 08/26/2013  . Severe major depression with psychotic features ([place]) 08/26/2013    Past Surgical History:  Procedure Laterality  Date  . C section    . CESAREAN SECTION       OB History   No obstetric history on file.     No family history on file.  Social History   Tobacco Use  . Smoking status: Current Some Day Smoker    Packs/day: 1.00    Types: Cigarettes  . Smokeless tobacco: Never Used  Substance Use Topics  . Alcohol use: Yes    Comment: occ  . Drug use: No    Types: "Crack" cocaine    Home Medications Prior to Admission medications   Medication Sig Start Date End Date Taking? Authorizing Provider  acyclovir (ZOVIRAX) 400 MG tablet Take 1 tablet (400 mg total) by mouth 3 (three) times daily. 12/22/15   Ward, Delice Bison, DO  benztropine (COGENTIN) 1 MG tablet Take 1 tablet (1 mg total) by mouth at bedtime. 05/25/15   Kerrie Buffalo, NP  citalopram (CELEXA) 40 MG tablet Take 1 tablet (40 mg total) by mouth daily. 05/25/15   Kerrie Buffalo, NP  doxycycline (VIBRAMYCIN) 100 MG capsule Take 1 capsule (100 mg total) by mouth 2 (two) times daily. One po bid x 7 days 12/02/15   Comer Locket, PA-C  haloperidol (HALDOL) 5 MG tablet Take 1 tablet (5 mg total) by mouth at bedtime. 05/25/15   Kerrie Buffalo, NP  HYDROcodone-acetaminophen (NORCO/VICODIN) 5-325 MG tablet Take 1 tablet by mouth every 4 (four) hours as needed. 01/06/16   Larene Pickett,  PA-C  insulin aspart (NOVOLOG) 100 UNIT/ML injection Inject 4-15 Units into the skin daily. Diabetes management 05/25/15   Adonis Brook, NP  insulin glargine (LANTUS) 100 UNIT/ML injection Inject 0.25 mLs (25 Units total) into the skin at bedtime. 05/25/15   Adonis Brook, NP  lidocaine (XYLOCAINE) 2 % solution Use as directed 10 mLs in the mouth or throat every 4 (four) hours as needed for mouth pain. Swish in mouth until tongue is numb and then spit out 12/22/15   Ward, Layla Maw, DO  lisinopril (PRINIVIL,ZESTRIL) 10 MG tablet Take 1 tablet (10 mg total) by mouth daily. 05/25/15   Adonis Brook, NP  metFORMIN (GLUCOPHAGE) 1000 MG tablet Take 1 tablet (1,000 mg  total) by mouth 2 (two) times daily with a meal. 05/25/15   Adonis Brook, NP  simvastatin (ZOCOR) 40 MG tablet Take 1 tablet (40 mg total) by mouth every evening. 05/25/15   Adonis Brook, NP  traZODone (DESYREL) 100 MG tablet Take 1 tablet (100 mg total) by mouth at bedtime. 05/25/15   Adonis Brook, NP    Allergies    Aspirin  Review of Systems   Review of Systems  All other systems reviewed and are negative.   Physical Exam Updated Vital Signs BP (!) 165/93 (BP Location: Left Arm)   Pulse 100   Temp 98.3 F (36.8 C) (Oral)   Resp 18   Ht 5\' 4"  (1.626 m)   Wt 58.1 kg   LMP 06/30/2013   SpO2 100%   BMI 21.97 kg/m   Physical Exam Vitals and nursing note reviewed.  Constitutional:      General: She is not in acute distress.    Appearance: She is well-developed. She is not diaphoretic.  HENT:     Head: Normocephalic and atraumatic.  Cardiovascular:     Rate and Rhythm: Normal rate and regular rhythm.     Heart sounds: No murmur. No friction rub. No gallop.   Pulmonary:     Effort: Pulmonary effort is normal. No respiratory distress.     Breath sounds: Normal breath sounds. No wheezing.  Abdominal:     General: Bowel sounds are normal. There is no distension.     Palpations: Abdomen is soft.     Tenderness: There is no abdominal tenderness.  Musculoskeletal:        General: Normal range of motion.     Cervical back: Normal range of motion and neck supple.     Comments: There is a 1 cm x 2 cm scabbed, mildly erythematous area to the mid anterior lower leg.  There is no purulent drainage noted.  The right great toenail is missing.  There is no purulent drainage, erythema, or other obvious concerning finding.  Skin:    General: Skin is warm and dry.  Neurological:     Mental Status: She is alert and oriented to person, place, and time.     ED Results / Procedures / Treatments   Labs (all labs ordered are listed, but only abnormal results are displayed) Labs  Reviewed  SARS CORONAVIRUS 2 (TAT 6-24 HRS)    EKG None  Radiology No results found.  Procedures Procedures (including critical care time)  Medications Ordered in ED Medications  acetaminophen (TYLENOL) tablet 650 mg (has no administration in time range)  bacitracin ointment (has no administration in time range)    ED Course  I have reviewed the triage vital signs and the nursing notes.  Pertinent labs & imaging results that  were available during my care of the patient were reviewed by me and considered in my medical decision making (see chart for details).    MDM Rules/Calculators/A&P  Patient will be treated with Augmentin for possible early cellulitis/prolonged URI.  Patient advised to perform wound care and return to the ER as needed.  She has also had a Covid test obtained and is currently pending.  Patient to isolate at home until these results are known.  Diana Santos was evaluated in Emergency Department on 02/11/2020 for the symptoms described in the history of present illness. She was evaluated in the context of the global COVID-19 pandemic, which necessitated consideration that the patient might be at risk for infection with the SARS-CoV-2 virus that causes COVID-19. Institutional protocols and algorithms that pertain to the evaluation of patients at risk for COVID-19 are in a state of rapid change based on information released by regulatory bodies including the CDC and federal and state organizations. These policies and algorithms were followed during the patient's care in the ED.  Final Clinical Impression(s) / ED Diagnoses Final diagnoses:  None    Rx / DC Orders ED Discharge Orders    None       Geoffery Lyons, MD 02/11/20 1142

## 2020-02-16 ENCOUNTER — Telehealth: Payer: Self-pay | Admitting: *Deleted

## 2020-02-16 NOTE — Telephone Encounter (Signed)
Pt calling for covid results, negative.  Pt verbalizes understanding. 

## 2023-11-26 ENCOUNTER — Other Ambulatory Visit: Payer: Self-pay

## 2023-11-26 ENCOUNTER — Emergency Department (HOSPITAL_BASED_OUTPATIENT_CLINIC_OR_DEPARTMENT_OTHER): Payer: 59

## 2023-11-26 ENCOUNTER — Emergency Department (HOSPITAL_BASED_OUTPATIENT_CLINIC_OR_DEPARTMENT_OTHER)
Admission: EM | Admit: 2023-11-26 | Discharge: 2023-11-26 | Disposition: A | Discharge status: Home / Self Care | Payer: 59 | Attending: Emergency Medicine | Admitting: Emergency Medicine

## 2023-11-26 ENCOUNTER — Encounter (HOSPITAL_BASED_OUTPATIENT_CLINIC_OR_DEPARTMENT_OTHER): Payer: Self-pay | Admitting: Emergency Medicine

## 2023-11-26 DIAGNOSIS — R110 Nausea: Secondary | ICD-10-CM | POA: Insufficient documentation

## 2023-11-26 DIAGNOSIS — R531 Weakness: Secondary | ICD-10-CM | POA: Insufficient documentation

## 2023-11-26 DIAGNOSIS — Z794 Long term (current) use of insulin: Secondary | ICD-10-CM | POA: Insufficient documentation

## 2023-11-26 DIAGNOSIS — Z20822 Contact with and (suspected) exposure to covid-19: Secondary | ICD-10-CM | POA: Insufficient documentation

## 2023-11-26 DIAGNOSIS — J069 Acute upper respiratory infection, unspecified: Secondary | ICD-10-CM | POA: Diagnosis not present

## 2023-11-26 DIAGNOSIS — R059 Cough, unspecified: Secondary | ICD-10-CM | POA: Diagnosis present

## 2023-11-26 DIAGNOSIS — Z20828 Contact with and (suspected) exposure to other viral communicable diseases: Secondary | ICD-10-CM

## 2023-11-26 HISTORY — DX: Atherosclerotic heart disease of native coronary artery without angina pectoris: I25.10

## 2023-11-26 LAB — URINALYSIS, ROUTINE W REFLEX MICROSCOPIC
Bilirubin Urine: NEGATIVE
Glucose, UA: >=500 mg/dL — AB
Hgb urine dipstick: NEGATIVE
Ketones, ur: NEGATIVE mg/dL
Leukocytes,Ua: NEGATIVE
Nitrite: NEGATIVE
Protein, ur: NEGATIVE mg/dL
Specific Gravity, Urine: 1.02 (ref 1.005–1.030)
pH: 6.5 (ref 5.0–8.0)

## 2023-11-26 LAB — BASIC METABOLIC PANEL
Anion gap: 7 (ref 5–15)
BUN: 22 mg/dL (ref 8–23)
CO2: 25 mmol/L (ref 22–32)
Calcium: 8.6 mg/dL — ABNORMAL LOW (ref 8.9–10.3)
Chloride: 104 mmol/L (ref 98–111)
Creatinine, Ser: 1.13 mg/dL — ABNORMAL HIGH (ref 0.44–1.00)
GFR, Estimated: 55 mL/min — ABNORMAL LOW (ref >60)
Glucose, Bld: 265 mg/dL — ABNORMAL HIGH (ref 70–99)
Potassium: 4.2 mmol/L (ref 3.5–5.1)
Sodium: 136 mmol/L (ref 135–145)

## 2023-11-26 LAB — CBC WITH DIFFERENTIAL/PLATELET
Abs Immature Granulocytes: 0.02 10*3/uL (ref 0.00–0.07)
Basophils Absolute: 0.1 10*3/uL (ref 0.0–0.1)
Basophils Relative: 1 %
Eosinophils Absolute: 0.1 10*3/uL (ref 0.0–0.5)
Eosinophils Relative: 1 %
HCT: 32 % — ABNORMAL LOW (ref 36.0–46.0)
Hemoglobin: 10 g/dL — ABNORMAL LOW (ref 12.0–15.0)
Immature Granulocytes: 0 %
Lymphocytes Relative: 23 %
Lymphs Abs: 2 10*3/uL (ref 0.7–4.0)
MCH: 25 pg — ABNORMAL LOW (ref 26.0–34.0)
MCHC: 31.3 g/dL (ref 30.0–36.0)
MCV: 80 fL (ref 80.0–100.0)
Monocytes Absolute: 0.5 10*3/uL (ref 0.1–1.0)
Monocytes Relative: 6 %
Neutro Abs: 5.8 10*3/uL (ref 1.7–7.7)
Neutrophils Relative %: 69 %
Platelets: 334 10*3/uL (ref 150–400)
RBC: 4 MIL/uL (ref 3.87–5.11)
RDW: 16.8 % — ABNORMAL HIGH (ref 11.5–15.5)
WBC: 8.5 10*3/uL (ref 4.0–10.5)
nRBC: 0 % (ref 0.0–0.2)

## 2023-11-26 LAB — RESP PANEL BY RT-PCR (RSV, FLU A&B, COVID)  RVPGX2
Influenza A by PCR: NEGATIVE
Influenza B by PCR: NEGATIVE
Resp Syncytial Virus by PCR: NEGATIVE
SARS Coronavirus 2 by RT PCR: NEGATIVE

## 2023-11-26 LAB — URINALYSIS, MICROSCOPIC (REFLEX)

## 2023-11-26 MED ORDER — ONDANSETRON 4 MG PO TBDP
4.0000 mg | ORAL_TABLET | Freq: Once | ORAL | Status: AC
  Administered 2023-11-26: 4 mg via ORAL
  Filled 2023-11-26: qty 1

## 2023-11-26 MED ORDER — ACETAMINOPHEN 325 MG PO TABS
650.0000 mg | ORAL_TABLET | Freq: Once | ORAL | Status: AC
  Administered 2023-11-26: 650 mg via ORAL
  Filled 2023-11-26: qty 2

## 2023-11-26 MED ORDER — BENZONATATE 100 MG PO CAPS
100.0000 mg | ORAL_CAPSULE | Freq: Once | ORAL | Status: AC
  Administered 2023-11-26: 100 mg via ORAL
  Filled 2023-11-26: qty 1

## 2023-11-26 NOTE — Discharge Instructions (Signed)
You likely have the flu like your family  I written you for a few medications to help with your symptoms  Tessalon Perles this is a medication help with cough Zofran this helps with nausea and vomiting Naproxen this helps the body aches and pains  Make sure to rest you may also take Tylenol at home  Follow-up outpatient, return for new or worsening symptoms

## 2023-11-26 NOTE — ED Triage Notes (Signed)
Nasal congestion, cough, generalized weakness, unknown fever, some chills for 3-4 days.

## 2023-11-26 NOTE — ED Provider Notes (Cosign Needed)
[facility] DE[facility]MENT AT [facility] Provider Note   CSN: [id number] Arrival date & time: 11/26/23  1635     History  Chief Complaint  Patient presents with   URI    Elexus Murrell is a 62 y.o. female with multiple medical problems here for evaluation of feeling unwell.  She has had cough, congestion, rhinorrhea, chills, generalized weakness.  Her whole family is sick since Christmas.  She has intermittent headaches however none currently.  No neck pain, chest pain, shortness of breath, abdominal pain.  She has had some intermittent nausea.  No diarrhea.  She is not taking any medication for symptoms.  She is here with 3 of her family members with similar symptoms, all family members have the FLU  HPI     Home Medications Prior to Admission medications   Medication Sig Start Date End Date Taking? Authorizing Provider  acyclovir (ZOVIRAX) 400 MG tablet Take 1 tablet (400 mg total) by mouth 3 (three) times daily. 12/22/15   Ward, Layla Maw, DO  benztropine (COGENTIN) 1 MG tablet Take 1 tablet (1 mg total) by mouth at bedtime. 05/25/15   Adonis Brook, NP  citalopram (CELEXA) 40 MG tablet Take 1 tablet (40 mg total) by mouth daily. 05/25/15   Adonis Brook, NP  haloperidol (HALDOL) 5 MG tablet Take 1 tablet (5 mg total) by mouth at bedtime. 05/25/15   Adonis Brook, NP  HYDROcodone-acetaminophen (NORCO/VICODIN) 5-325 MG tablet Take 1 tablet by mouth every 4 (four) hours as needed. 01/06/16   Garlon Hatchet, PA-C  insulin aspart (NOVOLOG) 100 UNIT/ML injection Inject 4-15 Units into the skin daily. Diabetes management 05/25/15   Adonis Brook, NP  insulin glargine (LANTUS) 100 UNIT/ML injection Inject 0.25 mLs (25 Units total) into the skin at bedtime. 05/25/15   Adonis Brook, NP  lidocaine (XYLOCAINE) 2 % solution Use as directed 10 mLs in the mouth or throat every 4 (four) hours as needed for mouth pain. Swish in mouth until tongue is numb and then spit out  12/22/15   Ward, Layla Maw, DO  lisinopril (PRINIVIL,ZESTRIL) 10 MG tablet Take 1 tablet (10 mg total) by mouth daily. 05/25/15   Adonis Brook, NP  metFORMIN (GLUCOPHAGE) 1000 MG tablet Take 1 tablet (1,000 mg total) by mouth 2 (two) times daily with a meal. 05/25/15   Adonis Brook, NP  simvastatin (ZOCOR) 40 MG tablet Take 1 tablet (40 mg total) by mouth every evening. 05/25/15   Adonis Brook, NP  traZODone (DESYREL) 100 MG tablet Take 1 tablet (100 mg total) by mouth at bedtime. 05/25/15   Adonis Brook, NP      Allergies    Aspirin and Naproxen sodium    Review of Systems   Review of Systems  Constitutional:  Positive for activity change, appetite change, chills and fatigue.  HENT:  Positive for congestion, postnasal drip, rhinorrhea, sinus pressure and sinus pain. Negative for sore throat, tinnitus, trouble swallowing and voice change.   Respiratory:  Positive for cough. Negative for apnea, choking, chest tightness, shortness of breath, wheezing and stridor.   Cardiovascular: Negative.   Gastrointestinal:  Positive for nausea. Negative for abdominal distention, abdominal pain, anal bleeding, blood in stool, constipation, diarrhea, rectal pain and vomiting.  Genitourinary: Negative.   Musculoskeletal: Negative.   Neurological:  Positive for weakness (generalized) and headaches. Negative for dizziness, tremors, seizures, syncope, facial asymmetry, speech difficulty, light-headedness and numbness.  All other systems reviewed and are negative.   Physical Exam Updated  Vital Signs BP (!) 188/73 (BP Location: Right Arm)   Pulse 83   Temp 98.9 F (37.2 C) (Oral)   Resp 14   Ht 5\' 4"  (1.626 m)   Wt 56.7 kg   LMP 06/30/2013   SpO2 97%   BMI 21.46 kg/m  Physical Exam Vitals and nursing note reviewed.  Constitutional:      General: She is not in acute distress.    Appearance: She is well-developed. She is not ill-appearing, toxic-appearing or diaphoretic.  HENT:     Head:  Normocephalic and atraumatic.     Nose: Congestion and rhinorrhea present.     Mouth/Throat:     Mouth: Mucous membranes are moist.  Eyes:     Pupils: Pupils are equal, round, and reactive to light.  Cardiovascular:     Rate and Rhythm: Normal rate.     Pulses: Normal pulses.     Heart sounds: Normal heart sounds.  Pulmonary:     Effort: Pulmonary effort is normal. No respiratory distress.     Breath sounds: Normal breath sounds.  Abdominal:     General: Bowel sounds are normal. There is no distension.     Palpations: Abdomen is soft. There is no mass.     Tenderness: There is no abdominal tenderness. There is no right CVA tenderness, left CVA tenderness, guarding or rebound.     Hernia: No hernia is present.  Musculoskeletal:        General: No swelling or deformity. Normal range of motion.     Cervical back: Normal range of motion.     Right lower leg: No edema.     Left lower leg: No edema.  Skin:    General: Skin is warm and dry.     Capillary Refill: Capillary refill takes less than 2 seconds.  Neurological:     General: No focal deficit present.     Mental Status: She is alert.  Psychiatric:        Mood and Affect: Mood normal.     ED Results / Procedures / Treatments   Labs (all labs ordered are listed, but only abnormal results are displayed) Labs Reviewed  CBC WITH DIFFERENTIAL/PLATELET - Abnormal; Notable for the following components:      Result Value   Hemoglobin 10.0 (*)    HCT 32.0 (*)    MCH 25.0 (*)    RDW 16.8 (*)    All other components within normal limits  BASIC METABOLIC PANEL - Abnormal; Notable for the following components:   Glucose, Bld 265 (*)    Creatinine, Ser 1.13 (*)    Calcium 8.6 (*)    GFR, Estimated 55 (*)    All other components within normal limits  URINALYSIS, ROUTINE W REFLEX MICROSCOPIC - Abnormal; Notable for the following components:   Glucose, UA >=500 (*)    All other components within normal limits  URINALYSIS,  MICROSCOPIC (REFLEX) - Abnormal; Notable for the following components:   Bacteria, UA RARE (*)    All other components within normal limits  RESP PANEL BY RT-PCR (RSV, FLU A&B, COVID)  [id number]    EKG None  Radiology DG Chest 2 View Result Date: 11/26/2023 CLINICAL DATA:  Cough.  Congestion and weakness. EXAM: CHEST - 2 VIEW COMPARISON:  04/06/2023 FINDINGS: Stable heart size.The cardiomediastinal contours are normal. The lungs are clear. Pulmonary vasculature is normal. No consolidation, pleural effusion, or pneumothorax. No acute osseous abnormalities are seen. IMPRESSION: No active cardiopulmonary disease. Electronically Signed  By: Narda Rutherford M.D.   On: 11/26/2023 19:19    Procedures Procedures    Medications Ordered in ED Medications  acetaminophen (TYLENOL) tablet 650 mg (650 mg Oral Given 11/26/23 2201)  ondansetron (ZOFRAN-ODT) disintegrating tablet 4 mg (4 mg Oral Given 11/26/23 2201)  benzonatate (TESSALON) capsule 100 mg (100 mg Oral Given 11/26/23 2206)    ED Course/ Medical Decision Making/ A&P   62 year old multiple medical problems here for evaluation of feeling unwell.  She is here with multiple family members who all same symptoms and were diagnosed with influenza.  Patient with cough, congestion, rhinorrhea, generalized weakness, chills.  She states she has no appetite.  No headache, neck pain, chest pain, shortness of breath, abdominal pain.  No meds PTA.  Labs and imaging personally viewed and interpreted:  CBC without leukocytosis, hemoglobin 10--similar to baseline Metabolic panel glucose 265, creatinine 1.13--last creatinine 0.97 at [facility] UA negative for infection Chest x-ray without cardiomegaly, pulm edema, pneumothorax Viral panel negative EKG without ischemic changes  I discussed results with patient, family in room.  Given they all stayed together I suspect patient had a poor swab and she actually does have influenza like the rest of her  family members.  She is outside the treatment window for Tamiflu.  Will treat symptomatically.  She is without any tachycardia, tachypnea or hypoxia.  She is tolerating p.o. intake here.  Will have her follow-up outpatient, return for new or worsening symptoms  The patient has been appropriately medically screened and/or stabilized in the ED. I have low suspicion for any other emergent medical condition which would require further screening, evaluation or treatment in the ED or require inpatient management.  Patient is hemodynamically stable and in no acute distress.  Patient able to ambulate in department prior to ED.  Evaluation does not show acute pathology that would require ongoing or additional emergent interventions while in the emergency department or further inpatient treatment.  I have discussed the diagnosis with the patient and answered all questions.  Pain is been managed while in the emergency department and patient has no further complaints prior to discharge.  Patient is comfortable with plan discussed in room and is stable for discharge at this time.  I have discussed strict return precautions for returning to the emergency department.  Patient was encouraged to follow-up with PCP/specialist refer to at discharge.                                Medical Decision Making Amount and/or Complexity of Data Reviewed Independent Historian:     Details: Daughter in room External Data Reviewed: labs, radiology, ECG and notes. Labs: ordered. Decision-making details documented in ED Course. Radiology: ordered and independent interpretation performed. Decision-making details documented in ED Course. ECG/medicine tests: ordered and independent interpretation performed. Decision-making details documented in ED Course.  Risk OTC drugs. Prescription drug management. Decision regarding hospitalization. Diagnosis or treatment significantly limited by social determinants of  health.          Final Clinical Impression(s) / ED Diagnoses Final diagnoses:  Viral URI with cough  Exposure to the flu    Rx / DC Orders ED Discharge Orders     None         Dmetrius Ambs A, PA-C 11/26/23 2310

## 2024-02-29 ENCOUNTER — Other Ambulatory Visit: Payer: Self-pay

## 2024-02-29 ENCOUNTER — Inpatient Hospital Stay (HOSPITAL_BASED_OUTPATIENT_CLINIC_OR_DEPARTMENT_OTHER)
Admission: EM | Admit: 2024-02-29 | Discharge: 2024-03-04 | DRG: 303 | Disposition: A | Discharge status: Home / Self Care | Attending: Internal Medicine | Admitting: Internal Medicine

## 2024-02-29 ENCOUNTER — Emergency Department (HOSPITAL_BASED_OUTPATIENT_CLINIC_OR_DEPARTMENT_OTHER)

## 2024-02-29 ENCOUNTER — Encounter (HOSPITAL_BASED_OUTPATIENT_CLINIC_OR_DEPARTMENT_OTHER): Payer: Self-pay | Admitting: Urology

## 2024-02-29 DIAGNOSIS — I10 Essential (primary) hypertension: Secondary | ICD-10-CM | POA: Diagnosis present

## 2024-02-29 DIAGNOSIS — R739 Hyperglycemia, unspecified: Secondary | ICD-10-CM | POA: Diagnosis not present

## 2024-02-29 DIAGNOSIS — F250 Schizoaffective disorder, bipolar type: Secondary | ICD-10-CM | POA: Diagnosis present

## 2024-02-29 DIAGNOSIS — Z886 Allergy status to analgesic agent status: Secondary | ICD-10-CM

## 2024-02-29 DIAGNOSIS — Z91148 Patient's other noncompliance with medication regimen for other reason: Secondary | ICD-10-CM

## 2024-02-29 DIAGNOSIS — M79672 Pain in left foot: Secondary | ICD-10-CM | POA: Diagnosis present

## 2024-02-29 DIAGNOSIS — E1165 Type 2 diabetes mellitus with hyperglycemia: Secondary | ICD-10-CM | POA: Diagnosis present

## 2024-02-29 DIAGNOSIS — M79671 Pain in right foot: Secondary | ICD-10-CM | POA: Diagnosis present

## 2024-02-29 DIAGNOSIS — I2510 Atherosclerotic heart disease of native coronary artery without angina pectoris: Secondary | ICD-10-CM | POA: Diagnosis not present

## 2024-02-29 DIAGNOSIS — R079 Chest pain, unspecified: Principal | ICD-10-CM | POA: Diagnosis present

## 2024-02-29 DIAGNOSIS — Z794 Long term (current) use of insulin: Secondary | ICD-10-CM

## 2024-02-29 DIAGNOSIS — K029 Dental caries, unspecified: Secondary | ICD-10-CM | POA: Diagnosis present

## 2024-02-29 DIAGNOSIS — Z955 Presence of coronary angioplasty implant and graft: Secondary | ICD-10-CM

## 2024-02-29 DIAGNOSIS — F17210 Nicotine dependence, cigarettes, uncomplicated: Secondary | ICD-10-CM | POA: Diagnosis present

## 2024-02-29 DIAGNOSIS — F1910 Other psychoactive substance abuse, uncomplicated: Secondary | ICD-10-CM | POA: Diagnosis present

## 2024-02-29 DIAGNOSIS — R0789 Other chest pain: Secondary | ICD-10-CM | POA: Diagnosis present

## 2024-02-29 DIAGNOSIS — D509 Iron deficiency anemia, unspecified: Secondary | ICD-10-CM | POA: Diagnosis present

## 2024-02-29 DIAGNOSIS — Y900 Blood alcohol level of less than 20 mg/100 ml: Secondary | ICD-10-CM | POA: Diagnosis present

## 2024-02-29 DIAGNOSIS — Z79899 Other long term (current) drug therapy: Secondary | ICD-10-CM

## 2024-02-29 DIAGNOSIS — K219 Gastro-esophageal reflux disease without esophagitis: Secondary | ICD-10-CM | POA: Diagnosis present

## 2024-02-29 DIAGNOSIS — N179 Acute kidney failure, unspecified: Secondary | ICD-10-CM | POA: Diagnosis present

## 2024-02-29 DIAGNOSIS — Z7902 Long term (current) use of antithrombotics/antiplatelets: Secondary | ICD-10-CM

## 2024-02-29 DIAGNOSIS — Z7984 Long term (current) use of oral hypoglycemic drugs: Secondary | ICD-10-CM

## 2024-02-29 DIAGNOSIS — Z5900 Homelessness unspecified: Secondary | ICD-10-CM

## 2024-02-29 DIAGNOSIS — F1010 Alcohol abuse, uncomplicated: Secondary | ICD-10-CM | POA: Diagnosis present

## 2024-02-29 LAB — CBC WITH DIFFERENTIAL/PLATELET
Abs Immature Granulocytes: 0.03 10*3/uL (ref 0.00–0.07)
Basophils Absolute: 0.1 10*3/uL (ref 0.0–0.1)
Basophils Relative: 1 %
Eosinophils Absolute: 0.1 10*3/uL (ref 0.0–0.5)
Eosinophils Relative: 1 %
HCT: 37.3 % (ref 36.0–46.0)
Hemoglobin: 12 g/dL (ref 12.0–15.0)
Immature Granulocytes: 0 %
Lymphocytes Relative: 30 %
Lymphs Abs: 2.5 10*3/uL (ref 0.7–4.0)
MCH: 25.2 pg — ABNORMAL LOW (ref 26.0–34.0)
MCHC: 32.2 g/dL (ref 30.0–36.0)
MCV: 78.4 fL — ABNORMAL LOW (ref 80.0–100.0)
Monocytes Absolute: 0.6 10*3/uL (ref 0.1–1.0)
Monocytes Relative: 7 %
Neutro Abs: 5.1 10*3/uL (ref 1.7–7.7)
Neutrophils Relative %: 61 %
Platelets: 301 10*3/uL (ref 150–400)
RBC: 4.76 MIL/uL (ref 3.87–5.11)
RDW: 18.8 % — ABNORMAL HIGH (ref 11.5–15.5)
WBC: 8.3 10*3/uL (ref 4.0–10.5)
nRBC: 0 % (ref 0.0–0.2)

## 2024-02-29 LAB — COMPREHENSIVE METABOLIC PANEL WITH GFR
ALT: 19 U/L (ref 0–44)
AST: 19 U/L (ref 15–41)
Albumin: 3.8 g/dL (ref 3.5–5.0)
Alkaline Phosphatase: 107 U/L (ref 38–126)
Anion gap: 11 (ref 5–15)
BUN: 23 mg/dL (ref 8–23)
CO2: 24 mmol/L (ref 22–32)
Calcium: 9.4 mg/dL (ref 8.9–10.3)
Chloride: 98 mmol/L (ref 98–111)
Creatinine, Ser: 1.35 mg/dL — ABNORMAL HIGH (ref 0.44–1.00)
GFR, Estimated: 44 mL/min — ABNORMAL LOW (ref >60)
Glucose, Bld: 409 mg/dL — ABNORMAL HIGH (ref 70–99)
Potassium: 4.6 mmol/L (ref 3.5–5.1)
Sodium: 133 mmol/L — ABNORMAL LOW (ref 135–145)
Total Bilirubin: 0.8 mg/dL (ref 0.0–1.2)
Total Protein: 8.2 g/dL — ABNORMAL HIGH (ref 6.5–8.1)

## 2024-02-29 LAB — CBG MONITORING, ED: Glucose-Capillary: 212 mg/dL — ABNORMAL HIGH (ref 70–99)

## 2024-02-29 LAB — LIPASE, BLOOD: Lipase: 43 U/L (ref 11–51)

## 2024-02-29 LAB — TROPONIN I (HIGH SENSITIVITY)
Troponin I (High Sensitivity): 89 ng/L — ABNORMAL HIGH (ref <18)
Troponin I (High Sensitivity): 82 ng/L — ABNORMAL HIGH (ref <18)

## 2024-02-29 LAB — ETHANOL: Alcohol, Ethyl (B): 10 mg/dL — ABNORMAL HIGH (ref <10)

## 2024-02-29 MED ORDER — NITROGLYCERIN 0.4 MG SL SUBL
0.4000 mg | SUBLINGUAL_TABLET | SUBLINGUAL | Status: DC | PRN
  Administered 2024-02-29: 0.4 mg via SUBLINGUAL
  Filled 2024-02-29: qty 1

## 2024-02-29 MED ORDER — IOHEXOL 350 MG/ML SOLN
75.0000 mL | Freq: Once | INTRAVENOUS | Status: AC | PRN
  Administered 2024-02-29: 75 mL via INTRAVENOUS

## 2024-02-29 MED ORDER — SODIUM CHLORIDE 0.9 % IV BOLUS
1000.0000 mL | Freq: Once | INTRAVENOUS | Status: AC
  Administered 2024-02-29: 1000 mL via INTRAVENOUS

## 2024-02-29 NOTE — ED Notes (Addendum)
 Per pt request, placed pt's car keys in a labeled bag and gave to registration for pt's daughter France Ravens to come pick up as pt is being admitted to [facility] and her car is here at the [facility] facility.

## 2024-02-29 NOTE — Progress Notes (Signed)
 Plan of Care Note for accepted transfer   Patient: Diana Santos MRN: [id number]   DOA: 02/29/2024  Facility requesting transfer: [facility]   Requesting Provider: Elpidio Anis, PA   Reason for transfer: Chest pain, hx of CAD   Facility course: 63 yr old female with HTN, T2DM, CAD, and drug and alcohol abuse who presents with chest pain.   She reports weeks of cough and more recent chest pain. She is also inquiring about getting into a rehabilitation program for drugs and alcohol.   She afebrile with stable vitals. SCr is 1.35, glucose 409, and troponin was 89 and then 82. CTA chest is negative for PE or other acute findings.   Cardiology (Dr. Cristal Deer) was consulted by ED PA and the patient was treated with nitroglycerin.   Plan of care: The patient is accepted for admission to Telemetry unit, at [facility].   Author: Briscoe Deutscher, MD 02/29/2024  Check [email or web address] for on-call coverage.  Nursing staff, Please call [facility] Admits & Consults System-Wide number on Amion as soon as patient's arrival, so appropriate admitting provider can evaluate the pt.

## 2024-02-29 NOTE — ED Triage Notes (Signed)
 Pt states productive cough x 1-2 weeks with associated chest pain   Trying to get into rehab for drugs and etoh  States h/o 2 stents placed   Last ETOH today  Crack use last at 0200

## 2024-02-29 NOTE — ED Notes (Signed)
 [facility] called for transport.

## 2024-02-29 NOTE — ED Provider Notes (Signed)
 [place] [facility] AT [facility] Provider Note   CSN: [id number] Arrival date & time: 02/29/24  1456     History  Chief Complaint  Patient presents with   Cough    Diana Santos is a 63 y.o. female.  63 year old female presents today for concern of productive cough which she states has been ongoing for months.  She states the primary reason she came in today was for the associated chest pain that she developed as well as for seeking rehab for her alcohol and drug abuse.  She does have history of CAD.  She had 2 stents placed about 1 year ago.    The history is provided by the patient. No language interpreter was used.       Home Medications Prior to Admission medications   Medication Sig Start Date End Date Taking? Authorizing Provider  acyclovir (ZOVIRAX) 400 MG tablet Take 1 tablet (400 mg total) by mouth 3 (three) times daily. 12/22/15   Ward, Layla Maw, DO  benztropine (COGENTIN) 1 MG tablet Take 1 tablet (1 mg total) by mouth at bedtime. 05/25/15   Adonis Brook, NP  citalopram (CELEXA) 40 MG tablet Take 1 tablet (40 mg total) by mouth daily. 05/25/15   Adonis Brook, NP  haloperidol (HALDOL) 5 MG tablet Take 1 tablet (5 mg total) by mouth at bedtime. 05/25/15   Adonis Brook, NP  HYDROcodone-acetaminophen (NORCO/VICODIN) 5-325 MG tablet Take 1 tablet by mouth every 4 (four) hours as needed. 01/06/16   Garlon Hatchet, PA-C  insulin aspart (NOVOLOG) 100 UNIT/ML injection Inject 4-15 Units into the skin daily. Diabetes management 05/25/15   Adonis Brook, NP  insulin glargine (LANTUS) 100 UNIT/ML injection Inject 0.25 mLs (25 Units total) into the skin at bedtime. 05/25/15   Adonis Brook, NP  lidocaine (XYLOCAINE) 2 % solution Use as directed 10 mLs in the mouth or throat every 4 (four) hours as needed for mouth pain. Swish in mouth until tongue is numb and then spit out 12/22/15   Ward, Layla Maw, DO  lisinopril (PRINIVIL,ZESTRIL) 10 MG tablet Take  1 tablet (10 mg total) by mouth daily. 05/25/15   Adonis Brook, NP  metFORMIN (GLUCOPHAGE) 1000 MG tablet Take 1 tablet (1,000 mg total) by mouth 2 (two) times daily with a meal. 05/25/15   Adonis Brook, NP  simvastatin (ZOCOR) 40 MG tablet Take 1 tablet (40 mg total) by mouth every evening. 05/25/15   Adonis Brook, NP  traZODone (DESYREL) 100 MG tablet Take 1 tablet (100 mg total) by mouth at bedtime. 05/25/15   Adonis Brook, NP      Allergies    Aspirin and Naproxen sodium    Review of Systems   Review of Systems  Constitutional:  Negative for chills and fever.  Respiratory:  Positive for cough. Negative for shortness of breath.   Cardiovascular:  Positive for chest pain. Negative for palpitations and leg swelling.  Neurological:  Negative for light-headedness.  All other systems reviewed and are negative.   Physical Exam Updated Vital Signs BP 119/76 (BP Location: Left Arm)   Pulse 91   Temp 98.6 F (37 C)   Resp 20   Ht 5\' 4"  (1.626 m)   Wt 56.7 kg   LMP 06/30/2013   SpO2 94%   BMI 21.46 kg/m  Physical Exam Vitals and nursing note reviewed.  Constitutional:      General: She is not in acute distress.    Appearance: Normal appearance. She is  not ill-appearing.  HENT:     Head: Normocephalic and atraumatic.     Nose: Nose normal.  Eyes:     General: No scleral icterus.    Extraocular Movements: Extraocular movements intact.     Conjunctiva/sclera: Conjunctivae normal.  Cardiovascular:     Rate and Rhythm: Normal rate and regular rhythm.  Pulmonary:     Effort: Pulmonary effort is normal. No respiratory distress.     Breath sounds: Normal breath sounds. No wheezing.  Musculoskeletal:        General: Normal range of motion.     Cervical back: Normal range of motion.     Right lower leg: No edema.     Left lower leg: No edema.  Skin:    General: Skin is warm and dry.  Neurological:     General: No focal deficit present.     Mental Status: She is alert.  Mental status is at baseline.     ED Results / Procedures / Treatments   Labs (all labs ordered are listed, but only abnormal results are displayed) Labs Reviewed  CBC WITH DIFFERENTIAL/PLATELET - Abnormal; Notable for the following components:      Result Value   MCV 78.4 (*)    MCH 25.2 (*)    RDW 18.8 (*)    All other components within normal limits  COMPREHENSIVE METABOLIC PANEL WITH GFR - Abnormal; Notable for the following components:   Sodium 133 (*)    Glucose, Bld 409 (*)    Creatinine, Ser 1.35 (*)    Total Protein 8.2 (*)    GFR, Estimated 44 (*)    All other components within normal limits  ETHANOL - Abnormal; Notable for the following components:   Alcohol, Ethyl (B) 10 (*)    All other components within normal limits  TROPONIN I (HIGH SENSITIVITY) - Abnormal; Notable for the following components:   Troponin I (High Sensitivity) 89 (*)    All other components within normal limits  LIPASE, BLOOD  TROPONIN I (HIGH SENSITIVITY)    EKG EKG Interpretation Date/Time:  Friday February 29 2024 15:06:08 EDT Ventricular Rate:  101 PR Interval:  142 QRS Duration:  70 QT Interval:  358 QTC Calculation: 464 R Axis:   -59  Text Interpretation: Sinus tachycardia Left anterior fascicular block When compared with ECG of 26-Nov-2023 21:46, PREVIOUS ECG IS PRESENT Confirmed by Virgina Norfolk [phone]) on 02/29/2024 3:07:51 PM  Radiology DG Chest 2 View Result Date: 02/29/2024 CLINICAL DATA:  Cough. EXAM: CHEST - 2 VIEW COMPARISON:  November 26, 2023. FINDINGS: The heart size and mediastinal contours are within normal limits. Both lungs are clear. The visualized skeletal structures are unremarkable. IMPRESSION: No active cardiopulmonary disease. Electronically Signed   By: Lupita Raider M.D.   On: 02/29/2024 16:12    Procedures Procedures    Medications Ordered in ED Medications - No data to display  ED Course/ Medical Decision Making/ A&P                                  Medical Decision Making Amount and/or Complexity of Data Reviewed Labs: ordered. Radiology: ordered.  Risk Prescription drug management.   Medical Decision Making / ED Course   This patient presents to the ED for concern of chest pain, cough, this involves an extensive number of treatment options, and is a complaint that carries with it a high risk of complications and  morbidity.  The differential diagnosis includes pneumonia, PE, ACS, viral URI  MDM: 63 year old female presents today for concern of cough which has been ongoing for couple months.  She states she developed chest pain today.  History of CAD status post 2 drug-eluting stents.  Noncompliant with her medications for the past couple weeks due to homelessness.  Endorses history of alcohol and drug abuse.  States she also would like to seek rehab.  CBC without leukocytosis or anemia.  CMP with glucose of 409, creatinine 1.35 otherwise without acute concern.  Will give fluid bolus.  Alcohol level of 10.  Initial troponin of 89.  Repeat 82.  Lipase within normal.  Given her history concerning for cardiac origin of her chest pain.  Discussed with cardiology due to her elevated heart score.  They recommend medicine admission and they will consult.  Will obtain PE study to ensure there is no other cause of her troponin leak.  Signout to oncoming provider to follow-up on PE study and discuss with hospitalist for admission.  If PE study negative patient can be given aspirin.  She is not compliant with her dual antiplatelet therapy. Discussed with attending.    Additional history obtained: -Additional history obtained from care everywhere -External records from outside source obtained and reviewed including: Chart review including previous notes, labs, imaging, consultation notes   Lab Tests: -I ordered, reviewed, and interpreted labs.   The pertinent results include:   Labs Reviewed  CBC WITH DIFFERENTIAL/PLATELET -  Abnormal; Notable for the following components:      Result Value   MCV 78.4 (*)    MCH 25.2 (*)    RDW 18.8 (*)    All other components within normal limits  COMPREHENSIVE METABOLIC PANEL WITH GFR - Abnormal; Notable for the following components:   Sodium 133 (*)    Glucose, Bld 409 (*)    Creatinine, Ser 1.35 (*)    Total Protein 8.2 (*)    GFR, Estimated 44 (*)    All other components within normal limits  ETHANOL - Abnormal; Notable for the following components:   Alcohol, Ethyl (B) 10 (*)    All other components within normal limits  TROPONIN I (HIGH SENSITIVITY) - Abnormal; Notable for the following components:   Troponin I (High Sensitivity) 89 (*)    All other components within normal limits  TROPONIN I (HIGH SENSITIVITY) - Abnormal; Notable for the following components:   Troponin I (High Sensitivity) 82 (*)    All other components within normal limits  LIPASE, BLOOD      EKG  EKG Interpretation Date/Time:  Friday February 29 2024 15:06:08 EDT Ventricular Rate:  101 PR Interval:  142 QRS Duration:  70 QT Interval:  358 QTC Calculation: 464 R Axis:   -59  Text Interpretation: Sinus tachycardia Left anterior fascicular block When compared with ECG of 26-Nov-2023 21:46, PREVIOUS ECG IS PRESENT Confirmed by Virgina Norfolk [phone]) on 02/29/2024 3:07:51 PM         Imaging Studies ordered: I ordered imaging studies including chest x-ray I independently visualized and interpreted imaging. I agree with the radiologist interpretation   Medicines ordered and prescription drug management: Meds ordered this encounter  Medications   nitroGLYCERIN (NITROSTAT) SL tablet 0.4 mg   sodium chloride 0.9 % bolus 1,000 mL    -I have reviewed the patients home medicines and have made adjustments as needed  Consultations Obtained: I requested consultation with the Dr. Gwendlyn Deutscher,  and discussed lab and imaging  findings as well as pertinent plan - they recommend: As above  of   Cardiac Monitoring: The patient was maintained on a cardiac monitor.  I personally viewed and interpreted the cardiac monitored which showed an underlying rhythm of: Normal sinus rhythm   Social Determinants of Health:  Factors impacting patients care include: Homelessness   Reevaluation: After the interventions noted above, I reevaluated the patient and found that they have :improved  Co morbidities that complicate the patient evaluation  Past Medical History:  Diagnosis Date   Arthritis    Asthma    Bipolar 1 disorder, depressed (HCC)    Coronary artery disease    Crack cocaine use    Diabetes mellitus without complication (HCC)    ETOH abuse    Hypertension    Schizoaffective disorder, bipolar type (HCC)    Substance abuse (HCC)       Dispostion: Signout to oncoming provider to follow-up on PE study and discussed with hospitalist for admission.   Final Clinical Impression(s) / ED Diagnoses Final diagnoses:  Chest pain, unspecified type    Rx / DC Orders ED Discharge Orders     None         Marita Kansas, PA-C 02/29/24 1835    Virgina Norfolk, DO 02/29/24 2003

## 2024-02-29 NOTE — ED Provider Notes (Signed)
  Physical Exam  BP (!) 157/94   Pulse 87   Temp 98.6 F (37 C)   Resp 19   Ht 5\' 4"  (1.626 m)   Wt 56.7 kg   LMP 06/30/2013   SpO2 99%   BMI 21.46 kg/m   Physical Exam  Procedures  Procedures  ED Course / MDM    Medical Decision Making Amount and/or Complexity of Data Reviewed Labs: ordered. Radiology: ordered.  Risk Prescription drug management.   H./o cad - stents x 2 one year ago Cough for weeks, chest pain today Troponin 89, 82 Per cards - admit to medicine, cards to consult Pending CTA PE Pain free currently. Also reports homelessness, as well as polysubstance abuse and desire to go to rehab.  Admit to [facility]  CTA PE negative for pulmonary embolus. Instructed to give ASA if negative PE study, however, patient list anaphylactic allergy to ASA. Will hold until further investigated.   Hospitalist paged for admission to [facility]. Repeat CBG pending.  Discussed with Dr. Antionette Char who accepts for admission.       Elpidio Anis, PA-C 02/29/24 2016    Virgina Norfolk, DO 02/29/24 2022

## 2024-03-01 DIAGNOSIS — Z794 Long term (current) use of insulin: Secondary | ICD-10-CM | POA: Diagnosis not present

## 2024-03-01 DIAGNOSIS — F1910 Other psychoactive substance abuse, uncomplicated: Secondary | ICD-10-CM | POA: Diagnosis present

## 2024-03-01 DIAGNOSIS — R079 Chest pain, unspecified: Secondary | ICD-10-CM

## 2024-03-01 DIAGNOSIS — M79671 Pain in right foot: Secondary | ICD-10-CM | POA: Diagnosis present

## 2024-03-01 DIAGNOSIS — Z955 Presence of coronary angioplasty implant and graft: Secondary | ICD-10-CM | POA: Diagnosis not present

## 2024-03-01 DIAGNOSIS — Z5900 Homelessness unspecified: Secondary | ICD-10-CM | POA: Diagnosis not present

## 2024-03-01 DIAGNOSIS — D509 Iron deficiency anemia, unspecified: Secondary | ICD-10-CM | POA: Diagnosis present

## 2024-03-01 DIAGNOSIS — K029 Dental caries, unspecified: Secondary | ICD-10-CM | POA: Diagnosis present

## 2024-03-01 DIAGNOSIS — Z7902 Long term (current) use of antithrombotics/antiplatelets: Secondary | ICD-10-CM | POA: Diagnosis not present

## 2024-03-01 DIAGNOSIS — R0789 Other chest pain: Secondary | ICD-10-CM | POA: Diagnosis present

## 2024-03-01 DIAGNOSIS — N179 Acute kidney failure, unspecified: Secondary | ICD-10-CM | POA: Diagnosis present

## 2024-03-01 DIAGNOSIS — Z91148 Patient's other noncompliance with medication regimen for other reason: Secondary | ICD-10-CM | POA: Diagnosis not present

## 2024-03-01 DIAGNOSIS — R739 Hyperglycemia, unspecified: Secondary | ICD-10-CM | POA: Diagnosis present

## 2024-03-01 DIAGNOSIS — I10 Essential (primary) hypertension: Secondary | ICD-10-CM | POA: Diagnosis present

## 2024-03-01 DIAGNOSIS — M79672 Pain in left foot: Secondary | ICD-10-CM | POA: Diagnosis present

## 2024-03-01 DIAGNOSIS — Y900 Blood alcohol level of less than 20 mg/100 ml: Secondary | ICD-10-CM | POA: Diagnosis present

## 2024-03-01 DIAGNOSIS — E1165 Type 2 diabetes mellitus with hyperglycemia: Secondary | ICD-10-CM | POA: Diagnosis present

## 2024-03-01 DIAGNOSIS — Z79899 Other long term (current) drug therapy: Secondary | ICD-10-CM | POA: Diagnosis not present

## 2024-03-01 DIAGNOSIS — F250 Schizoaffective disorder, bipolar type: Secondary | ICD-10-CM | POA: Diagnosis present

## 2024-03-01 DIAGNOSIS — Z886 Allergy status to analgesic agent status: Secondary | ICD-10-CM | POA: Diagnosis not present

## 2024-03-01 DIAGNOSIS — K219 Gastro-esophageal reflux disease without esophagitis: Secondary | ICD-10-CM | POA: Diagnosis present

## 2024-03-01 DIAGNOSIS — I2510 Atherosclerotic heart disease of native coronary artery without angina pectoris: Secondary | ICD-10-CM | POA: Diagnosis present

## 2024-03-01 DIAGNOSIS — Z7984 Long term (current) use of oral hypoglycemic drugs: Secondary | ICD-10-CM | POA: Diagnosis not present

## 2024-03-01 DIAGNOSIS — M79673 Pain in unspecified foot: Secondary | ICD-10-CM | POA: Diagnosis not present

## 2024-03-01 DIAGNOSIS — F1010 Alcohol abuse, uncomplicated: Secondary | ICD-10-CM | POA: Diagnosis present

## 2024-03-01 DIAGNOSIS — F17210 Nicotine dependence, cigarettes, uncomplicated: Secondary | ICD-10-CM | POA: Diagnosis present

## 2024-03-01 LAB — CBC
HCT: 30.8 % — ABNORMAL LOW (ref 36.0–46.0)
Hemoglobin: 9.9 g/dL — ABNORMAL LOW (ref 12.0–15.0)
MCH: 25.1 pg — ABNORMAL LOW (ref 26.0–34.0)
MCHC: 32.1 g/dL (ref 30.0–36.0)
MCV: 78.2 fL — ABNORMAL LOW (ref 80.0–100.0)
Platelets: 274 10*3/uL (ref 150–400)
RBC: 3.94 MIL/uL (ref 3.87–5.11)
RDW: 18.9 % — ABNORMAL HIGH (ref 11.5–15.5)
WBC: 7.6 10*3/uL (ref 4.0–10.5)
nRBC: 0 % (ref 0.0–0.2)

## 2024-03-01 LAB — GLUCOSE, CAPILLARY
Glucose-Capillary: 349 mg/dL — ABNORMAL HIGH (ref 70–99)
Glucose-Capillary: 138 mg/dL — ABNORMAL HIGH (ref 70–99)
Glucose-Capillary: 241 mg/dL — ABNORMAL HIGH (ref 70–99)
Glucose-Capillary: 347 mg/dL — ABNORMAL HIGH (ref 70–99)
Glucose-Capillary: 126 mg/dL — ABNORMAL HIGH (ref 70–99)

## 2024-03-01 LAB — BASIC METABOLIC PANEL WITH GFR
Anion gap: 9 (ref 5–15)
BUN: 17 mg/dL (ref 8–23)
CO2: 23 mmol/L (ref 22–32)
Calcium: 8.8 mg/dL — ABNORMAL LOW (ref 8.9–10.3)
Chloride: 104 mmol/L (ref 98–111)
Creatinine, Ser: 1.06 mg/dL — ABNORMAL HIGH (ref 0.44–1.00)
GFR, Estimated: 59 mL/min — ABNORMAL LOW (ref >60)
Glucose, Bld: 392 mg/dL — ABNORMAL HIGH (ref 70–99)
Potassium: 3.7 mmol/L (ref 3.5–5.1)
Sodium: 136 mmol/L (ref 135–145)

## 2024-03-01 LAB — LIPID PANEL
Cholesterol: 176 mg/dL (ref 0–200)
HDL: 75 mg/dL (ref >40)
LDL Cholesterol: 74 mg/dL (ref 0–99)
Total CHOL/HDL Ratio: 2.3 ratio
Triglycerides: 135 mg/dL (ref <150)
VLDL: 27 mg/dL (ref 0–40)

## 2024-03-01 LAB — HEMOGLOBIN A1C
Hgb A1c MFr Bld: 11.4 % — ABNORMAL HIGH (ref 4.8–5.6)
Mean Plasma Glucose: 280.48 mg/dL

## 2024-03-01 LAB — URIC ACID: Uric Acid, Serum: 5.6 mg/dL (ref 2.5–7.1)

## 2024-03-01 LAB — HIV ANTIBODY (ROUTINE TESTING W REFLEX): HIV Screen 4th Generation wRfx: NONREACTIVE

## 2024-03-01 LAB — PHOSPHORUS: Phosphorus: 4.4 mg/dL (ref 2.5–4.6)

## 2024-03-01 LAB — MAGNESIUM: Magnesium: 2.2 mg/dL (ref 1.7–2.4)

## 2024-03-01 MED ORDER — LORAZEPAM 2 MG/ML IJ SOLN: 1.0000 mg | INTRAMUSCULAR | Status: AC | PRN

## 2024-03-01 MED ORDER — ACETAMINOPHEN 325 MG PO TABS
650.0000 mg | ORAL_TABLET | Freq: Four times a day (QID) | ORAL | Status: DC | PRN
  Administered 2024-03-01 – 2024-03-04 (×9): 650 mg via ORAL
  Filled 2024-03-01 (×8): qty 2

## 2024-03-01 MED ORDER — INSULIN ASPART 100 UNIT/ML IJ SOLN
0.0000 [IU] | Freq: Three times a day (TID) | INTRAMUSCULAR | Status: DC
  Administered 2024-03-01 (×2): 7 [IU] via SUBCUTANEOUS
  Administered 2024-03-02: 5 [IU] via SUBCUTANEOUS
  Administered 2024-03-02: 3 [IU] via SUBCUTANEOUS
  Administered 2024-03-02 – 2024-03-04 (×5): 5 [IU] via SUBCUTANEOUS

## 2024-03-01 MED ORDER — FOLIC ACID 1 MG PO TABS
1.0000 mg | ORAL_TABLET | Freq: Every day | ORAL | Status: DC
Start: 2024-03-01 — End: 2024-03-04
  Administered 2024-03-01 – 2024-03-04 (×4): 1 mg via ORAL
  Filled 2024-03-01 (×4): qty 1

## 2024-03-01 MED ORDER — LORAZEPAM 1 MG PO TABS: 1.0000 mg | ORAL_TABLET | ORAL | Status: AC | PRN

## 2024-03-01 MED ORDER — INSULIN GLARGINE-YFGN 100 UNIT/ML ~~LOC~~ SOLN
10.0000 [IU] | Freq: Every day | SUBCUTANEOUS | Status: DC
  Administered 2024-03-01 – 2024-03-02 (×2): 10 [IU] via SUBCUTANEOUS
  Filled 2024-03-01 (×2): qty 0.1

## 2024-03-01 MED ORDER — ENOXAPARIN SODIUM 40 MG/0.4ML IJ SOSY
40.0000 mg | PREFILLED_SYRINGE | Freq: Every day | INTRAMUSCULAR | Status: DC
  Administered 2024-03-01 – 2024-03-04 (×4): 40 mg via SUBCUTANEOUS
  Filled 2024-03-01 (×4): qty 0.4

## 2024-03-01 MED ORDER — THIAMINE MONONITRATE 100 MG PO TABS
100.0000 mg | ORAL_TABLET | Freq: Every day | ORAL | Status: DC
  Administered 2024-03-01 – 2024-03-04 (×3): 100 mg via ORAL
  Filled 2024-03-01 (×4): qty 1

## 2024-03-01 MED ORDER — SIMVASTATIN 20 MG PO TABS
40.0000 mg | ORAL_TABLET | Freq: Every evening | ORAL | Status: DC
  Administered 2024-03-01 – 2024-03-03 (×3): 40 mg via ORAL
  Filled 2024-03-01 (×3): qty 2

## 2024-03-01 MED ORDER — POLYETHYLENE GLYCOL 3350 17 G PO PACK: 17.0000 g | PACK | Freq: Every day | ORAL | Status: DC | PRN

## 2024-03-01 MED ORDER — CLOPIDOGREL BISULFATE 75 MG PO TABS
75.0000 mg | ORAL_TABLET | Freq: Every day | ORAL | Status: DC
  Administered 2024-03-02 – 2024-03-04 (×3): 75 mg via ORAL
  Filled 2024-03-01 (×3): qty 1

## 2024-03-01 MED ORDER — ADULT MULTIVITAMIN W/MINERALS CH
1.0000 | ORAL_TABLET | Freq: Every day | ORAL | Status: DC
  Administered 2024-03-01 – 2024-03-04 (×4): 1 via ORAL
  Filled 2024-03-01 (×4): qty 1

## 2024-03-01 MED ORDER — MELATONIN 5 MG PO TABS: 5.0000 mg | ORAL_TABLET | Freq: Every evening | ORAL | Status: DC | PRN

## 2024-03-01 MED ORDER — PROCHLORPERAZINE EDISYLATE 10 MG/2ML IJ SOLN: 5.0000 mg | Freq: Four times a day (QID) | INTRAMUSCULAR | Status: DC | PRN

## 2024-03-01 MED ORDER — THIAMINE HCL 100 MG/ML IJ SOLN
100.0000 mg | Freq: Every day | INTRAMUSCULAR | Status: DC
  Administered 2024-03-02: 100 mg via INTRAVENOUS
  Filled 2024-03-01 (×2): qty 2

## 2024-03-01 MED ORDER — SODIUM CHLORIDE 0.9 % IV SOLN: INTRAVENOUS | Status: DC

## 2024-03-01 MED ORDER — CLOPIDOGREL BISULFATE 75 MG PO TABS
75.0000 mg | ORAL_TABLET | Freq: Once | ORAL | Status: AC
  Administered 2024-03-01: 75 mg via ORAL
  Filled 2024-03-01: qty 1

## 2024-03-01 MED ORDER — INSULIN ASPART 100 UNIT/ML IJ SOLN
0.0000 [IU] | Freq: Every day | INTRAMUSCULAR | Status: DC
  Administered 2024-03-03: 4 [IU] via SUBCUTANEOUS

## 2024-03-01 NOTE — Plan of Care (Signed)

## 2024-03-01 NOTE — Progress Notes (Signed)
 [facility] PROGRESS NOTE   Towanda Hornstein ZOX:[id number] DOB: July 28, 1961 DOA: 02/29/2024  PCP: Patient, No Pcp Per  Brief History: 63 y.o. female with medical history significant for coronary artery disease status post PCI with stent placement a year ago, polysubstance abuse including alcohol and tobacco, who presented at [facility] ED with complaints of intermittent chest pain pressure-like similar to prior symptoms that led to PCI with stent placement.  Associated with a persistent cough.  In the ER, troponin were flat and peaked at 89.  No evidence of acute ischemia on 12-lead EKG. noted to be significantly hyperglycemic.  She was hospitalized for further management.    Consultants: None.  Procedures: Echocardiogram is pending    Subjective/Interval History: Patient denies any chest pain currently.  She is concerned about her homelessness status and also wants to go through alcohol rehab.    Assessment/Plan:  Chest pain in the setting of known history of coronary artery disease Symptoms were somewhat atypical. Had only minimal elevation in troponin.  EKG was nonischemic. Denies any chest pain this morning. CT angiogram did not show any PE. No echocardiogram in our system.  We will obtain an echocardiogram. Patient reports allergy to aspirin with mention of anaphylaxis.  Looks like she was given 1 dose of Plavix.  Continue Plavix for now. Noted to be on statin. Prior to admission medication list reviewed.  Lisinopril as mentioned.  Compliance is questionable.  Supposed to be on statin as well.  No beta-blocker noted. Care everywhere reviewed.  She [facility].  Showed significant stenosis in the posterior lateral branch of the RCA.  Treated with 2 drug-eluting stents.  Aspirin and Plavix was recommended at that time.  She has probably not been compliant.  Microcytic anemia Hemoglobin close to baseline.  Initial value was likely  hemoconcentrated. Check anemia panel.  Diabetes mellitus type 2, uncontrolled with hyperglycemia Prior to admission [facility] suggest that she was on Lantus, NovoLog and metformin.  Compliance is questionable. Came with a glucose level of 409.  Patient currently on SSI.  CBGs have improved. HbA1c 11.4.  Diabetes coordinator to see.  Elevated creatinine Creatinine was 1.35 at admission.  Noted to be 1.06 this morning.  Unclear if she has CKD.  Polysubstance abuse including tobacco alcohol CIWA protocol in place.  TOC will be consulted to provide outpatient resources.  DVT Prophylaxis: Lovenox Code Status: Full code Family Communication: Discussed with patient Disposition Plan: To be determined  Status is: Observation The patient will require care spanning > 2 midnights and should be moved to inpatient because: Chest pain, concern for alcohol withdrawal    Medications: Scheduled:  clopidogrel  75 mg Oral Once   enoxaparin (LOVENOX) injection  40 mg Subcutaneous Daily   folic acid  1 mg Oral Daily   insulin aspart  0-5 Units Subcutaneous QHS   insulin aspart  0-9 Units Subcutaneous TID WC   multivitamin with minerals  1 tablet Oral Daily   simvastatin  40 mg Oral QPM   thiamine  100 mg Oral Daily   Or   thiamine  100 mg Intravenous Daily   Continuous:  sodium chloride 50 mL/hr at 03/01/24 0213   WJX:[id number], LORazepam **OR** LORazepam, melatonin, nitroGLYCERIN, polyethylene glycol, prochlorperazine   Objective:  Vital Signs  Vitals:   02/29/24 2100 02/29/24 2221 03/01/24 0009 03/01/24 0538  BP: 126/80 (!) 159/85 (!) 177/98 (!) 149/97  Pulse: 90  75   Resp: 14 17 18  18  Temp:  97.8 F (36.6 C) (!) 97.5 F (36.4 C) 98 F (36.7 C)  TempSrc:  Oral Oral Oral  SpO2: 99% 100% 97% 100%  Weight:  54.5 kg  54.5 kg  Height:        Intake/Output Summary (Last 24 hours) at 03/01/2024 1016 Last data filed at 02/29/2024 2019 Gross per 24 hour  Intake 1000 ml  Output --   Net 1000 ml   Filed Weights   02/29/24 1503 02/29/24 2221 03/01/24 0538  Weight: 56.7 kg 54.5 kg 54.5 kg    General appearance: Awake alert.  In no distress Resp: Clear to auscultation bilaterally.  Normal effort Cardio: S1-S2 is normal regular.  No S3-S4.  No rubs murmurs or bruit GI: Abdomen is soft.  Nontender nondistended.  Bowel sounds are present normal.  No masses organomegaly Extremities: No edema.  Full range of motion of lower extremities. Neurologic: Alert and oriented x3.  No focal neurological deficits.    Lab Results:  Data Reviewed: I have personally reviewed following labs and reports of the imaging studies  CBC: Recent Labs  Lab 02/29/24 1532 03/01/24 0239  WBC 8.3 7.6  NEUTROABS 5.1  --   HGB 12.0 9.9*  HCT 37.3 30.8*  MCV 78.4* 78.2*  PLT 301 274    Basic Metabolic Panel: Recent Labs  Lab 02/29/24 1532 03/01/24 0239  NA 133* 136  K 4.6 3.7  CL 98 104  CO2 24 23  GLUCOSE 409* 392*  BUN 23 17  CREATININE 1.35* 1.06*  CALCIUM 9.4 8.8*  MG  --  2.2  PHOS  --  4.4    GFR: Estimated Creatinine Clearance: 46.7 mL/min (A) (by C-G formula based on SCr of 1.06 mg/dL (H)).  Liver Function Tests: Recent Labs  Lab 02/29/24 1532  AST 19  ALT 19  ALKPHOS 107  BILITOT 0.8  PROT 8.2*  ALBUMIN 3.8    Recent Labs  Lab 02/29/24 1532  LIPASE 43    HbA1C: Recent Labs    03/01/24 0239  HGBA1C 11.4*    CBG: Recent Labs  Lab 02/29/24 2017 03/01/24 0607 03/01/24 0813  GLUCAP 212* 349* 138*     Radiology Studies: CT Angio Chest PE W and/or Wo Contrast Result Date: 02/29/2024 CLINICAL DATA:  Productive cough and chest pain EXAM: CT ANGIOGRAPHY CHEST WITH CONTRAST TECHNIQUE: Multidetector CT imaging of the chest was performed using the standard protocol during bolus administration of intravenous contrast. Multiplanar CT image reconstructions and MIPs were obtained to evaluate the vascular anatomy. RADIATION DOSE REDUCTION: This exam was  performed according to the departmental dose-optimization program which includes automated exposure control, adjustment of the mA and/or kV according to patient size and/or use of iterative reconstruction technique. CONTRAST:  75mL OMNIPAQUE IOHEXOL 350 MG/ML SOLN COMPARISON:  Chest x-ray 02/29/2024 FINDINGS: Cardiovascular: Satisfactory opacification of the pulmonary arteries to the segmental level. No evidence of pulmonary embolism. Normal heart size. No pericardial effusion. Nonaneurysmal aorta. Mild atherosclerosis. Mediastinum/Nodes: No enlarged mediastinal, hilar, or axillary lymph nodes. Thyroid gland, trachea, and esophagus demonstrate no significant findings. Lungs/Pleura: Lungs are clear. No pleural effusion or pneumothorax. Upper Abdomen: No acute abnormality. Musculoskeletal: No chest wall abnormality. No acute or significant osseous findings. Review of the MIP images confirms the above findings. IMPRESSION: 1. Negative for acute pulmonary embolus. 2. Clear lung fields Aortic Atherosclerosis (ICD10-I70.0). Electronically Signed   By: Jasmine Pang M.D.   On: 02/29/2024 19:39   DG Chest 2 View Result Date:  02/29/2024 CLINICAL DATA:  Cough. EXAM: CHEST - 2 VIEW COMPARISON:  November 26, 2023. FINDINGS: The heart size and mediastinal contours are within normal limits. Both lungs are clear. The visualized skeletal structures are unremarkable. IMPRESSION: No active cardiopulmonary disease. Electronically Signed   By: Lupita Raider M.D.   On: 02/29/2024 16:12       LOS: 0 days   Chinmayi Rumer Rito Ehrlich  [facility] Pager on [email or web address]  03/01/2024, 10:16 AM

## 2024-03-01 NOTE — Plan of Care (Signed)
   Problem: Education: Goal: Knowledge of General Education information will improve Description Including pain rating scale, medication(s)/side effects and non-pharmacologic comfort measures Outcome: Progressing

## 2024-03-01 NOTE — Plan of Care (Signed)
 Pt vital signs and assessment unremarkable

## 2024-03-01 NOTE — H&P (Addendum)
 History and Physical  Diana Santos ZOX:[id number] DOB: 1960-12-02 DOA: 02/29/2024  Referring physician: Accepted by Dr. Sherrell Puller, hospitalist service. PCP: Patient, No Pcp Per  Outpatient Specialists: Cardiologist. Patient coming from: Home through [facility] ED  Chief Complaint: Chest pain  HPI: Diana Santos is a 63 y.o. female with medical history significant for coronary artery disease status post PCI with stent placement a year ago, polysubstance abuse including alcohol and tobacco, who initially presented at [facility] ED with complaints of intermittent chest pain pressure-like similar to prior symptoms that led to PCI with stent placement.  Associated with a persistent cough.  The pain was worse today and she decided to present to the ER for further evaluation.  In the ER, troponin were flat and peaked at 89.  No evidence of acute ischemia on 12-lead EKG.  Significantly hyperglycemic with serum glucose of greater than 400.  EDP consulted cardiology.  Admitted by [facility], hospitalist service.  Accepted by Dr. Antionette Char and transferred to [facility] telemetry cardiac unit as observation status.  At the time of the visit, the patient's symptoms had improved.  ED Course: Temperature 98.  BP 149/97.  Pulse 62.  Respiratory rate 18, saturation 100% on room air.  Review of Systems: Review of systems as noted in the HPI. All other systems reviewed and are negative.   Past Medical History:  Diagnosis Date   Arthritis    Asthma    Bipolar 1 disorder, depressed (HCC)    Coronary artery disease    Crack cocaine use    Diabetes mellitus without complication (HCC)    ETOH abuse    Hypertension    Schizoaffective disorder, bipolar type (HCC)    Substance abuse (HCC)    Past Surgical History:  Procedure Laterality Date   C section     CESAREAN SECTION     CORONARY STENT PLACEMENT      Social History:  reports that she has been smoking cigarettes. She has never used smokeless tobacco. She  reports current alcohol use. She reports that she does not currently use drugs after having used the following drugs: "Crack" cocaine.   Allergies  Allergen Reactions   Aspirin Anaphylaxis and Swelling    Tolerates ibuprofen   Naproxen Sodium Anaphylaxis    Throat swelled up and closed and she had to go to hospital.  York Spaniel she took aspirin in 2023 with no ill effect.    Family history: None reported.  Prior to Admission medications   Medication Sig Start Date End Date Taking? Authorizing Provider  acyclovir (ZOVIRAX) 400 MG tablet Take 1 tablet (400 mg total) by mouth 3 (three) times daily. 12/22/15   Ward, Layla Maw, DO  benztropine (COGENTIN) 1 MG tablet Take 1 tablet (1 mg total) by mouth at bedtime. 05/25/15   Adonis Brook, NP  citalopram (CELEXA) 40 MG tablet Take 1 tablet (40 mg total) by mouth daily. 05/25/15   Adonis Brook, NP  haloperidol (HALDOL) 5 MG tablet Take 1 tablet (5 mg total) by mouth at bedtime. 05/25/15   Adonis Brook, NP  HYDROcodone-acetaminophen (NORCO/VICODIN) 5-325 MG tablet Take 1 tablet by mouth every 4 (four) hours as needed. 01/06/16   Garlon Hatchet, PA-C  insulin aspart (NOVOLOG) 100 UNIT/ML injection Inject 4-15 Units into the skin daily. Diabetes management 05/25/15   Adonis Brook, NP  insulin glargine (LANTUS) 100 UNIT/ML injection Inject 0.25 mLs (25 Units total) into the skin at bedtime. 05/25/15   Adonis Brook, NP  lidocaine (XYLOCAINE)  2 % solution Use as directed 10 mLs in the mouth or throat every 4 (four) hours as needed for mouth pain. Swish in mouth until tongue is numb and then spit out 12/22/15   Ward, Layla Maw, DO  lisinopril (PRINIVIL,ZESTRIL) 10 MG tablet Take 1 tablet (10 mg total) by mouth daily. 05/25/15   Adonis Brook, NP  metFORMIN (GLUCOPHAGE) 1000 MG tablet Take 1 tablet (1,000 mg total) by mouth 2 (two) times daily with a meal. 05/25/15   Adonis Brook, NP  simvastatin (ZOCOR) 40 MG tablet Take 1 tablet (40 mg total) by  mouth every evening. 05/25/15   Adonis Brook, NP  traZODone (DESYREL) 100 MG tablet Take 1 tablet (100 mg total) by mouth at bedtime. 05/25/15   Adonis Brook, NP    Physical Exam: BP (!) 177/98 (BP Location: Right Arm)   Pulse 90   Temp (!) 97.5 F (36.4 C) (Oral)   Resp 18   Ht 5\' 4"  (1.626 m)   Wt 54.5 kg   LMP 06/30/2013   SpO2 97%   BMI 20.62 kg/m   General: 63 y.o. year-old female well developed well nourished in no acute distress.  Alert and oriented x3. Cardiovascular: Regular rate and rhythm with no rubs or gallops.  No thyromegaly or JVD noted.  No lower extremity edema. 2/4 pulses in all 4 extremities. Respiratory: Clear to auscultation with no wheezes or rales. Good inspiratory effort. Abdomen: Soft nontender nondistended with normal bowel sounds x4 quadrants. Muskuloskeletal: No cyanosis, clubbing or edema noted bilaterally Neuro: CN II-XII intact, strength, sensation, reflexes Skin: No ulcerative lesions noted or rashes Psychiatry: Judgement and insight appear normal. Mood is appropriate for condition and setting          Labs on Admission:  Basic Metabolic Panel: Recent Labs  Lab 02/29/24 1532  NA 133*  K 4.6  CL 98  CO2 24  GLUCOSE 409*  BUN 23  CREATININE 1.35*  CALCIUM 9.4   Liver Function Tests: Recent Labs  Lab 02/29/24 1532  AST 19  ALT 19  ALKPHOS 107  BILITOT 0.8  PROT 8.2*  ALBUMIN 3.8   Recent Labs  Lab 02/29/24 1532  LIPASE 43   No results for input(s): "AMMONIA" in the last 168 hours. CBC: Recent Labs  Lab 02/29/24 1532  WBC 8.3  NEUTROABS 5.1  HGB 12.0  HCT 37.3  MCV 78.4*  PLT 301   Cardiac Enzymes: No results for input(s): "CKTOTAL", "CKMB", "CKMBINDEX", "TROPONINI" in the last 168 hours.  BNP (last 3 results) No results for input(s): "BNP" in the last 8760 hours.  ProBNP (last 3 results) No results for input(s): "PROBNP" in the last 8760 hours.  CBG: Recent Labs  Lab 02/29/24 2017  GLUCAP 212*     Radiological Exams on Admission: CT Angio Chest PE W and/or Wo Contrast Result Date: 02/29/2024 CLINICAL DATA:  Productive cough and chest pain EXAM: CT ANGIOGRAPHY CHEST WITH CONTRAST TECHNIQUE: Multidetector CT imaging of the chest was performed using the standard protocol during bolus administration of intravenous contrast. Multiplanar CT image reconstructions and MIPs were obtained to evaluate the vascular anatomy. RADIATION DOSE REDUCTION: This exam was performed according to the departmental dose-optimization program which includes automated exposure control, adjustment of the mA and/or kV according to patient size and/or use of iterative reconstruction technique. CONTRAST:  75mL OMNIPAQUE IOHEXOL 350 MG/ML SOLN COMPARISON:  Chest x-ray 02/29/2024 FINDINGS: Cardiovascular: Satisfactory opacification of the pulmonary arteries to the segmental level. No evidence of pulmonary  embolism. Normal heart size. No pericardial effusion. Nonaneurysmal aorta. Mild atherosclerosis. Mediastinum/Nodes: No enlarged mediastinal, hilar, or axillary lymph nodes. Thyroid gland, trachea, and esophagus demonstrate no significant findings. Lungs/Pleura: Lungs are clear. No pleural effusion or pneumothorax. Upper Abdomen: No acute abnormality. Musculoskeletal: No chest wall abnormality. No acute or significant osseous findings. Review of the MIP images confirms the above findings. IMPRESSION: 1. Negative for acute pulmonary embolus. 2. Clear lung fields Aortic Atherosclerosis (ICD10-I70.0). Electronically Signed   By: Jasmine Pang M.D.   On: 02/29/2024 19:39   DG Chest 2 View Result Date: 02/29/2024 CLINICAL DATA:  Cough. EXAM: CHEST - 2 VIEW COMPARISON:  November 26, 2023. FINDINGS: The heart size and mediastinal contours are within normal limits. Both lungs are clear. The visualized skeletal structures are unremarkable. IMPRESSION: No active cardiopulmonary disease. Electronically Signed   By: Lupita Raider M.D.   On:  02/29/2024 16:12    EKG: I independently viewed the EKG done and my findings are as followed: Sinus tachycardia rate of 101.  Nonspecific ST-T changes.  QTc 464.  Assessment/Plan Present on Admission:  Chest pain  Principal Problem:   Chest pain  Atypical chest pain Troponin flat, peaked at 89 Follow 2D echo and lipid panel Monitor on telemetry  Polysubstance abuse including tobacco and alcohol Recommend complete cessation CIWA protocol in place  Coronary artery disease status post PCI with stent placement Resume home regimen once home meds are reconciled. 75 mg Plavix given Allergic to aspirin   Time: 75 minutes.   DVT prophylaxis: Subcu Lovenox daily  Code Status: Full code  Family Communication: None at bedside  Disposition Plan: Admitted to telemetry cardiac unit  Consults called: Cardiology consulted by EDP  Admission status: Observation   Status is: Observation    Darlin Drop MD [facility] Pager [phone]  If 7PM-7AM, please contact night-coverage [email or web address] Password [facility]  03/01/2024, 12:42 AM

## 2024-03-01 NOTE — Inpatient Diabetes Management (Signed)
 Inpatient Diabetes Program Recommendations  AACE/ADA: New Consensus Statement on Inpatient Glycemic Control (2015)  Target Ranges:  Prepandial:   less than 140 mg/dL      Peak postprandial:   less than 180 mg/dL (1-2 hours)      Critically ill patients:  140 - 180 mg/dL   Lab Results  Component Value Date   GLUCAP 138 (H) 03/01/2024   HGBA1C 11.4 (H) 03/01/2024    Review of Glycemic Control  Diabetes history: DM2 Outpatient Diabetes medications: Lantus 25 units hs, Novolog 4-15 units tid Current orders for Inpatient glycemic control: Novolog 0-9 units tid, 0-5 units hs  Inpatient Diabetes Program Recommendations:   Spoke with patient via phone (DM coordinator on weekend call working remotely). Pt states she has not been taking her insulin. Living in car, homeless, wanting to get into a rehab program and get straightened out. States she needs to get her Medicaid restarted-ran out end of February.  Please consider: -Semglee 10 units now and daily  Thank you, Billy Fischer. Donisha Hoch, RN, MSN, CDCES  Diabetes Coordinator Inpatient Glycemic Control Team Team Pager [phone] (8am-5pm) 03/01/2024 12:19 PM

## 2024-03-02 ENCOUNTER — Inpatient Hospital Stay (HOSPITAL_COMMUNITY)

## 2024-03-02 DIAGNOSIS — R079 Chest pain, unspecified: Secondary | ICD-10-CM

## 2024-03-02 DIAGNOSIS — M79673 Pain in unspecified foot: Secondary | ICD-10-CM

## 2024-03-02 DIAGNOSIS — E1165 Type 2 diabetes mellitus with hyperglycemia: Secondary | ICD-10-CM

## 2024-03-02 DIAGNOSIS — Z794 Long term (current) use of insulin: Secondary | ICD-10-CM

## 2024-03-02 LAB — COMPREHENSIVE METABOLIC PANEL WITH GFR
ALT: 14 U/L (ref 0–44)
AST: 15 U/L (ref 15–41)
Albumin: 2.8 g/dL — ABNORMAL LOW (ref 3.5–5.0)
Alkaline Phosphatase: 98 U/L (ref 38–126)
Anion gap: 8 (ref 5–15)
BUN: 21 mg/dL (ref 8–23)
CO2: 21 mmol/L — ABNORMAL LOW (ref 22–32)
Calcium: 8.5 mg/dL — ABNORMAL LOW (ref 8.9–10.3)
Chloride: 103 mmol/L (ref 98–111)
Creatinine, Ser: 1.02 mg/dL — ABNORMAL HIGH (ref 0.44–1.00)
GFR, Estimated: >60 mL/min (ref >60)
Glucose, Bld: 334 mg/dL — ABNORMAL HIGH (ref 70–99)
Potassium: 4.1 mmol/L (ref 3.5–5.1)
Sodium: 132 mmol/L — ABNORMAL LOW (ref 135–145)
Total Bilirubin: 0.4 mg/dL (ref 0.0–1.2)
Total Protein: 6.2 g/dL — ABNORMAL LOW (ref 6.5–8.1)

## 2024-03-02 LAB — CBC
HCT: 31.4 % — ABNORMAL LOW (ref 36.0–46.0)
Hemoglobin: 10.1 g/dL — ABNORMAL LOW (ref 12.0–15.0)
MCH: 25.4 pg — ABNORMAL LOW (ref 26.0–34.0)
MCHC: 32.2 g/dL (ref 30.0–36.0)
MCV: 78.9 fL — ABNORMAL LOW (ref 80.0–100.0)
Platelets: 276 10*3/uL (ref 150–400)
RBC: 3.98 MIL/uL (ref 3.87–5.11)
RDW: 18.6 % — ABNORMAL HIGH (ref 11.5–15.5)
WBC: 8.1 10*3/uL (ref 4.0–10.5)
nRBC: 0 % (ref 0.0–0.2)

## 2024-03-02 LAB — GLUCOSE, CAPILLARY
Glucose-Capillary: 265 mg/dL — ABNORMAL HIGH (ref 70–99)
Glucose-Capillary: 204 mg/dL — ABNORMAL HIGH (ref 70–99)
Glucose-Capillary: 206 mg/dL — ABNORMAL HIGH (ref 70–99)
Glucose-Capillary: 158 mg/dL — ABNORMAL HIGH (ref 70–99)

## 2024-03-02 LAB — RETICULOCYTES
Immature Retic Fract: 17.6 % — ABNORMAL HIGH (ref 2.3–15.9)
RBC.: 4 MIL/uL (ref 3.87–5.11)
Retic Count, Absolute: 54.8 10*3/uL (ref 19.0–186.0)
Retic Ct Pct: 1.4 % (ref 0.4–3.1)

## 2024-03-02 LAB — IRON AND TIBC
Iron: 60 ug/dL (ref 28–170)
Saturation Ratios: 14 % (ref 10.4–31.8)
TIBC: 444 ug/dL (ref 250–450)
UIBC: 384 ug/dL

## 2024-03-02 LAB — ECHOCARDIOGRAM COMPLETE
AR max vel: 1.85 cm2
AV Area VTI: 2.17 cm2
AV Area mean vel: 2.11 cm2
AV Mean grad: 4 mmHg
AV Peak grad: 8.6 mmHg
Ao pk vel: 1.47 m/s
Area-P 1/2: 2.76 cm2
Height: 64 in
S' Lateral: 2.4 cm
Weight: 1929.47 [oz_av]

## 2024-03-02 LAB — VITAMIN B12: Vitamin B-12: 439 pg/mL (ref 180–914)

## 2024-03-02 LAB — FOLATE: Folate: 16.2 ng/mL (ref >5.9)

## 2024-03-02 LAB — FERRITIN: Ferritin: 8 ng/mL — ABNORMAL LOW (ref 11–307)

## 2024-03-02 MED ORDER — AMLODIPINE BESYLATE 5 MG PO TABS
5.0000 mg | ORAL_TABLET | Freq: Every day | ORAL | Status: DC
  Administered 2024-03-02 – 2024-03-04 (×3): 5 mg via ORAL
  Filled 2024-03-02 (×3): qty 1

## 2024-03-02 MED ORDER — PANTOPRAZOLE SODIUM 40 MG PO TBEC
40.0000 mg | DELAYED_RELEASE_TABLET | Freq: Two times a day (BID) | ORAL | Status: DC
  Administered 2024-03-02 – 2024-03-04 (×5): 40 mg via ORAL
  Filled 2024-03-02: qty 2
  Filled 2024-03-02 (×4): qty 1

## 2024-03-02 MED ORDER — INSULIN GLARGINE-YFGN 100 UNIT/ML ~~LOC~~ SOLN
15.0000 [IU] | Freq: Every day | SUBCUTANEOUS | Status: DC
  Administered 2024-03-03 – 2024-03-04 (×2): 15 [IU] via SUBCUTANEOUS
  Filled 2024-03-02 (×2): qty 0.15

## 2024-03-02 MED ORDER — CARVEDILOL 3.125 MG PO TABS
3.1250 mg | ORAL_TABLET | Freq: Two times a day (BID) | ORAL | Status: DC
  Administered 2024-03-02 – 2024-03-04 (×5): 3.125 mg via ORAL
  Filled 2024-03-02 (×5): qty 1

## 2024-03-02 MED ORDER — OXYCODONE HCL 5 MG PO TABS
5.0000 mg | ORAL_TABLET | Freq: Once | ORAL | Status: AC
  Administered 2024-03-02: 5 mg via ORAL
  Filled 2024-03-02: qty 1

## 2024-03-02 MED ORDER — CITALOPRAM HYDROBROMIDE 20 MG PO TABS
40.0000 mg | ORAL_TABLET | Freq: Every day | ORAL | Status: DC
  Administered 2024-03-02 – 2024-03-04 (×3): 40 mg via ORAL
  Filled 2024-03-02 (×3): qty 2

## 2024-03-02 NOTE — Progress Notes (Addendum)
 [facility] PROGRESS NOTE   Diana Santos ZOX:[id number] DOB: [birth year] DOA: 02/29/2024  PCP: Patient, No Pcp Per  Brief History: 63 y.o. female with medical history significant for coronary artery disease status post PCI with stent placement a year ago, polysubstance abuse including alcohol and tobacco, who presented at [facility] ED with complaints of intermittent chest pain pressure-like similar to prior symptoms that led to PCI with stent placement.  Associated with a persistent cough.  In the ER, troponin were flat and peaked at 89.  No evidence of acute ischemia on 12-lead EKG. noted to be significantly hyperglycemic.  She was hospitalized for further management.    Consultants: None.  Procedures: Echocardiogram is pending.  ABIs are pending.    Subjective/Interval History: Patient denies any shortness of breath.  Did have some chest pain overnight but none currently.  No nausea vomiting.  Foot pain is about the same.    Assessment/Plan:  Chest pain in the setting of known history of coronary artery disease Symptoms were somewhat atypical. Care everywhere reviewed.  [facility].  Showed significant stenosis in the posterior lateral branch of the RCA.  Treated with 2 drug-eluting stents.  Aspirin and Plavix was recommended at that time.  She has probably not been compliant.  There is also report of allergy to aspirin with mention of anaphylaxis. Had only minimal elevation in troponin.  EKG was nonischemic. CT angiogram did not show any PE. Echocardiogram is pending. Continue Plavix. Noted to be on statin. Prior to admission medication list reviewed.  Lisinopril as mentioned.  Compliance is questionable.  Supposed to be on statin as well.  No beta-blocker noted. Mentions a lot of acid reflux.  Could be reason for her chest discomfort.  Will initiate PPI.  Bilateral foot pain No wounds noted.  Peripheral pulses are poorly  palpable.  No evidence for ischemia.  ABIs have been ordered and pending.  Microcytic anemia Hemoglobin close to baseline.  Initial value was likely hemoconcentrated. Anemia panel reviewed.  Ferritin 8, iron 60, TIBC 444, percent saturation 14.  Vitamin B12 439.  Folic acid 16.2. Will need further evaluation as outpatient for iron deficiency.  Benefit from iron supplements at discharge.  Diabetes mellitus type 2, uncontrolled with hyperglycemia Prior to admission [place] suggest that she was on Lantus, NovoLog and metformin.  Compliance is questionable. Came with a glucose level of 409.   Patient on glargine and SSI.  CBGs could be better controlled.  Will increase the dose of glargine slightly.   HbA1c 11.4.   Essential hypertension Poorly controlled blood pressures noted.  Home medication list reviewed.  Resume amlodipine and carvedilol.  Elevated creatinine Creatinine was 1.35 at admission.  Has improved to 1.02.  Unclear if she has CKD.  Polysubstance abuse including tobacco alcohol CIWA protocol in place.  TOC will be consulted to provide outpatient resources.  DVT Prophylaxis: Lovenox Code Status: Full code Family Communication: Discussed with patient Disposition Plan: To be determined   Medications: Scheduled:  clopidogrel  75 mg Oral Daily   enoxaparin (LOVENOX) injection  40 mg Subcutaneous Daily   folic acid  1 mg Oral Daily   insulin aspart  0-5 Units Subcutaneous QHS   insulin aspart  0-9 Units Subcutaneous TID WC   insulin glargine-yfgn  10 Units Subcutaneous Daily   multivitamin with minerals  1 tablet Oral Daily   simvastatin  40 mg Oral QPM   thiamine  100 mg Oral Daily  Or   thiamine  100 mg Intravenous Daily   Continuous:   ZOX:[id number], LORazepam **OR** LORazepam, melatonin, nitroGLYCERIN, polyethylene glycol, prochlorperazine   Objective:  Vital Signs  Vitals:   03/01/24 2054 03/01/24 2345 03/02/24 0402 03/02/24 0910  BP: (!) 134/92 (!)  162/88 (!) 188/96 136/89  Pulse: 67 74 65 64  Resp: 20  18 18   Temp: 98 F (36.7 C) (!) 97.5 F (36.4 C) 98.1 F (36.7 C) 98.1 F (36.7 C)  TempSrc: Oral Oral Oral Oral  SpO2: 99% 100% 99% 100%  Weight:   54.7 kg   Height:        Intake/Output Summary (Last 24 hours) at 03/02/2024 0927 Last data filed at 03/01/2024 2115 Gross per 24 hour  Intake 720 ml  Output 600 ml  Net 120 ml   Filed Weights   02/29/24 2221 03/01/24 0538 03/02/24 0402  Weight: 54.5 kg 54.5 kg 54.7 kg    General appearance: Awake alert.  In no distress Resp: Clear to auscultation bilaterally.  Normal effort Cardio: S1-S2 is normal regular.  No S3-S4.  No rubs murmurs or bruit GI: Abdomen is soft.  Nontender nondistended.  Bowel sounds are present normal.  No masses organomegaly Extremities: No edema.  Full range of motion of lower extremities. Neurologic: Alert and oriented x3.  No focal neurological deficits.    Lab Results:  Data Reviewed: I have personally reviewed following labs and reports of the imaging studies  CBC: Recent Labs  Lab 02/29/24 1532 03/01/24 0239 03/02/24 0241  WBC 8.3 7.6 8.1  NEUTROABS 5.1  --   --   HGB 12.0 9.9* 10.1*  HCT 37.3 30.8* 31.4*  MCV 78.4* 78.2* 78.9*  PLT 301 274 276    Basic Metabolic Panel: Recent Labs  Lab 02/29/24 1532 03/01/24 0239 03/02/24 0241  NA 133* 136 132*  K 4.6 3.7 4.1  CL 98 104 103  CO2 24 23 21*  GLUCOSE 409* 392* 334*  BUN 23 17 21   CREATININE 1.35* 1.06* 1.02*  CALCIUM 9.4 8.8* 8.5*  MG  --  2.2  --   PHOS  --  4.4  --     GFR: Estimated Creatinine Clearance: 48.7 mL/min (A) (by C-G formula based on SCr of 1.02 mg/dL (H)).  Liver Function Tests: Recent Labs  Lab 02/29/24 1532 03/02/24 0241  AST 19 15  ALT 19 14  ALKPHOS 107 98  BILITOT 0.8 0.4  PROT 8.2* 6.2*  ALBUMIN 3.8 2.8*    Recent Labs  Lab 02/29/24 1532  LIPASE 43    HbA1C: Recent Labs    03/01/24 0239  HGBA1C 11.4*    CBG: Recent Labs  Lab  03/01/24 0813 03/01/24 1148 03/01/24 1600 03/01/24 2053 03/02/24 0534  GLUCAP 138* 241* 347* 126* 265*     Radiology Studies: CT Angio Chest PE W and/or Wo Contrast Result Date: 02/29/2024 CLINICAL DATA:  Productive cough and chest pain EXAM: CT ANGIOGRAPHY CHEST WITH CONTRAST TECHNIQUE: Multidetector CT imaging of the chest was performed using the standard protocol during bolus administration of intravenous contrast. Multiplanar CT image reconstructions and MIPs were obtained to evaluate the vascular anatomy. RADIATION DOSE REDUCTION: This exam was performed according to the departmental dose-optimization program which includes automated exposure control, adjustment of the mA and/or kV according to patient size and/or use of iterative reconstruction technique. CONTRAST:  75mL OMNIPAQUE IOHEXOL 350 MG/ML SOLN COMPARISON:  Chest x-ray 02/29/2024 FINDINGS: Cardiovascular: Satisfactory opacification of the pulmonary arteries to the segmental  level. No evidence of pulmonary embolism. Normal heart size. No pericardial effusion. Nonaneurysmal aorta. Mild atherosclerosis. Mediastinum/Nodes: No enlarged mediastinal, hilar, or axillary lymph nodes. Thyroid gland, trachea, and esophagus demonstrate no significant findings. Lungs/Pleura: Lungs are clear. No pleural effusion or pneumothorax. Upper Abdomen: No acute abnormality. Musculoskeletal: No chest wall abnormality. No acute or significant osseous findings. Review of the MIP images confirms the above findings. IMPRESSION: 1. Negative for acute pulmonary embolus. 2. Clear lung fields Aortic Atherosclerosis (ICD10-I70.0). Electronically Signed   By: Jasmine Pang M.D.   On: 02/29/2024 19:39   DG Chest 2 View Result Date: 02/29/2024 CLINICAL DATA:  Cough. EXAM: CHEST - 2 VIEW COMPARISON:  November 26, 2023. FINDINGS: The heart size and mediastinal contours are within normal limits. Both lungs are clear. The visualized skeletal structures are unremarkable.  IMPRESSION: No active cardiopulmonary disease. Electronically Signed   By: Lupita Raider M.D.   On: 02/29/2024 16:12       LOS: 1 day   Diana Santos  [facility] Pager on [email or web address]  03/02/2024, 9:27 AM

## 2024-03-02 NOTE — Progress Notes (Signed)
 VASCULAR LAB    ABI has been performed.  See CV proc for preliminary results.   Seann Genther, RVT 03/02/2024, 3:56 PM

## 2024-03-02 NOTE — Inpatient Diabetes Management (Signed)
 Inpatient Diabetes Program Recommendations  AACE/ADA: New Consensus Statement on Inpatient Glycemic Control (2015)  Target Ranges:  Prepandial:   less than 140 mg/dL      Peak postprandial:   less than 180 mg/dL (1-2 hours)      Critically ill patients:  140 - 180 mg/dL   Lab Results  Component Value Date   GLUCAP 204 (H) 03/02/2024   HGBA1C 11.4 (H) 03/01/2024    Latest Reference Range & Units 03/01/24 11:48 03/01/24 16:00 03/01/24 20:53 03/02/24 05:34 03/02/24 13:11  Glucose-Capillary 70 - 99 mg/dL 098 (H) 119 (H) 147 (H) 265 (H) 204 (H)  (H): Data is abnormally high  Diabetes history: DM2 Outpatient Diabetes medications: Lantus 25 units hs, Novolog 4-15 units tid Current orders for Inpatient glycemic control: Lantus 10 units daily, Novolog 0-9 units tid, 0-5 units hs  Inpatient Diabetes Program Recommendations:   Please consider: -Increase Lantus to to 15 units daily  Thank you, Darel Hong E. Oree Hislop, RN, MSN, CDCES  Diabetes Coordinator Inpatient Glycemic Control Team Team Pager [phone] (8am-5pm) 03/02/2024 2:45 PM

## 2024-03-02 NOTE — Plan of Care (Signed)
   Problem: Education: Goal: Knowledge of General Education information will improve Description: Including pain rating scale, medication(s)/side effects and non-pharmacologic comfort measures Outcome: Progressing   Problem: Clinical Measurements: Goal: Ability to maintain clinical measurements within normal limits will improve Outcome: Progressing

## 2024-03-02 NOTE — Progress Notes (Signed)
 Echocardiogram 2D Echocardiogram has been performed.  Jiovani Mccammon N Curlee Bogan,RDCS 03/02/2024, 3:13 PM

## 2024-03-03 DIAGNOSIS — R079 Chest pain, unspecified: Secondary | ICD-10-CM | POA: Diagnosis not present

## 2024-03-03 LAB — CBC
HCT: 31.9 % — ABNORMAL LOW (ref 36.0–46.0)
Hemoglobin: 10.2 g/dL — ABNORMAL LOW (ref 12.0–15.0)
MCH: 25.3 pg — ABNORMAL LOW (ref 26.0–34.0)
MCHC: 32 g/dL (ref 30.0–36.0)
MCV: 79.2 fL — ABNORMAL LOW (ref 80.0–100.0)
Platelets: 293 10*3/uL (ref 150–400)
RBC: 4.03 MIL/uL (ref 3.87–5.11)
RDW: 19 % — ABNORMAL HIGH (ref 11.5–15.5)
WBC: 9 10*3/uL (ref 4.0–10.5)
nRBC: 0 % (ref 0.0–0.2)

## 2024-03-03 LAB — GLUCOSE, CAPILLARY
Glucose-Capillary: 254 mg/dL — ABNORMAL HIGH (ref 70–99)
Glucose-Capillary: 259 mg/dL — ABNORMAL HIGH (ref 70–99)
Glucose-Capillary: 292 mg/dL — ABNORMAL HIGH (ref 70–99)
Glucose-Capillary: 319 mg/dL — ABNORMAL HIGH (ref 70–99)

## 2024-03-03 LAB — VAS US ABI WITH/WO TBI
Left ABI: 0.76
Right ABI: 1.03

## 2024-03-03 LAB — COMPREHENSIVE METABOLIC PANEL WITH GFR
ALT: 13 U/L (ref 0–44)
AST: 14 U/L — ABNORMAL LOW (ref 15–41)
Albumin: 2.9 g/dL — ABNORMAL LOW (ref 3.5–5.0)
Alkaline Phosphatase: 71 U/L (ref 38–126)
Anion gap: 9 (ref 5–15)
BUN: 19 mg/dL (ref 8–23)
CO2: 22 mmol/L (ref 22–32)
Calcium: 8.7 mg/dL — ABNORMAL LOW (ref 8.9–10.3)
Chloride: 102 mmol/L (ref 98–111)
Creatinine, Ser: 0.82 mg/dL (ref 0.44–1.00)
GFR, Estimated: >60 mL/min (ref >60)
Glucose, Bld: 273 mg/dL — ABNORMAL HIGH (ref 70–99)
Potassium: 3.7 mmol/L (ref 3.5–5.1)
Sodium: 133 mmol/L — ABNORMAL LOW (ref 135–145)
Total Bilirubin: 0.5 mg/dL (ref 0.0–1.2)
Total Protein: 6.4 g/dL — ABNORMAL LOW (ref 6.5–8.1)

## 2024-03-03 NOTE — Plan of Care (Signed)

## 2024-03-03 NOTE — Progress Notes (Signed)
 Mobility Specialist Progress Note:   03/03/24 0921  Mobility  Activity Stood at bedside  Level of Assistance Contact guard assist, steadying assist  Assistive Device None  Distance Ambulated (ft)  (deferred to significant dizziness)  Activity Response Tolerated fair  Mobility Referral Yes  Mobility visit 1 Mobility  Mobility Specialist Start Time (ACUTE ONLY) [id number]  Mobility Specialist Stop Time (ACUTE ONLY) 0935  Mobility Specialist Time Calculation (min) (ACUTE ONLY) 14 min   Pt agreeable to mobility session. Once pt sat EOB, c/o dizziness. BP 115/74 (86) while sitting, 108/71 (82). Pt required contact assist to steady while static standing. Further mobility deferred at this time. Will return later today.  Addison Lank Mobility Specialist Please contact via SecureChat or  Rehab office at [phone]

## 2024-03-03 NOTE — Inpatient Diabetes Management (Addendum)
 Inpatient Diabetes Program Recommendations  AACE/ADA: New Consensus Statement on Inpatient Glycemic Control (2015)  Target Ranges:  Prepandial:   less than 140 mg/dL      Peak postprandial:   less than 180 mg/dL (1-2 hours)      Critically ill patients:  140 - 180 mg/dL    Latest Reference Range & Units 02/29/24 15:32  Glucose 70 - 99 mg/dL 784 (H)  (H): Data is abnormally high  Latest Reference Range & Units 03/01/24 02:39  Hemoglobin A1C 4.8 - 5.6 % 11.4 (H)  280 mg/dl  (H): Data is abnormally high  Latest Reference Range & Units 03/02/24 05:34 03/02/24 13:11 03/02/24 16:15 03/02/24 21:09  Glucose-Capillary 70 - 99 mg/dL 696 (H)  5 units Novolog  204 (H)  5 units Novolog @1135   10 units Semglee @1136   206 (H)  3 units Novolog  158 (H)  (H): Data is abnormally high  Latest Reference Range & Units 03/03/24 06:20  Glucose-Capillary 70 - 99 mg/dL 295 (H)  5 units Novolog   (H): Data is abnormally high   Admit with: CP/ Bilateral Foot Pain  History: DM  Home DM Meds: Lantus 25 units at HS     Novolog 4-15 units Daily  Current Orders: Semglee 15 units daily     Novolog Sensitive Correction Scale/ SSI (0-9 units) TID AC + HS    MD- Note Semglee increased to 15 units daily this AM  Please also consider starting Novolog Meal Coverage: Novolog 3 units TID with meals HOLD if pt NPO HOLD if pt eats <50% meals    Per Home Med Rec, Lantus and Novolog insulins could not be confirmed Last time insulins were prescribed was 2016 No dispense records  Per DM Coordinator note 04/05:  "Pt states she has not been taking her insulin. Living in car, homeless, wanting to get into a rehab program and get straightened out. States she needs to get her Medicaid restarted-ran out end of February."    Addendum 12:30pm--Met w/ pt at bedside.  She confirmed she has been living in her car on the streets in [facility].  Admits to drug use PTA (last Thursday) (not sure what drug she  used).  Has been taking insulin sporadically (keeps her belongings and meds in the car).  Wants to go to Drug Rehab and told me she does not want to lives on the streets due to fear of being tempted to use drugs again and fear of her safety.  Told me she has a son and daughter but cannot live with them.  Told me she could get public housing assistance through [place], however, needs her birth certificate from [facility].    We briefly talked about her A1c being high and her CBGs being high an admission.  Her basic living needs are taking priority at this point.  She will not be able to take insulin safely in her car once the temperature gets too hot as her insulin will denature in the heat.  Have contacted [facility] team to discuss all of the above issues.      --Will follow patient during hospitalization--  Ambrose Finland RN, MSN, CDCES Diabetes Coordinator Inpatient Glycemic Control Team Team Pager: [phone] (8a-5p)

## 2024-03-03 NOTE — TOC CM/SW Note (Signed)
 Transition of Care [facility]) - Inpatient Brief Assessment   Patient Details  Name: Diana Santos MRN: [id number] Date of Birth: Jul 19, 1961  [facility] CM/SW Contact:    Michaela Corner, LCSWA Phone Number: 03/03/2024, 3:04 PM   Clinical Narrative: CSW introduced self/role to patient and reviewed SDOH needs. CSW provided patient with resources for housing, transportation, and food. Patient inquired with CSW about substance use rehab placement. CSW provided patient with substance use resources for rehab.   TOC will continue to follow.    Transition of Care Asessment: Insurance and Status: Insurance coverage has been reviewed Patient has primary care physician: Yes Home environment has been reviewed: Homeless Prior level of function:: Independent Prior/Current Home Services: No current home services Social Drivers of Health Review: SDOH reviewed interventions complete Readmission risk has been reviewed: Yes Transition of care needs: no transition of care needs at this time

## 2024-03-03 NOTE — Progress Notes (Signed)
 [facility] PROGRESS NOTE   Diana Santos ZOX:[id number] DOB: 1961/06/12 DOA: 02/29/2024  PCP: Patient, No Pcp Per  Brief History: 63 y.o. female with medical history significant for coronary artery disease status post PCI with stent placement a year ago, polysubstance abuse including alcohol and tobacco, who presented at [facility] ED with complaints of intermittent chest pain pressure-like similar to prior symptoms that led to PCI with stent placement.  Associated with a persistent cough.  In the ER, troponin were flat and peaked at 89.  No evidence of acute ischemia on 12-lead EKG. noted to be significantly hyperglycemic.  She was hospitalized for further management.    Consultants: None.   Subjective/Interval History: Patient denies any shortness of breath or chest pain. She says she feels unwell, though unable to explain exactly what she feels. She does complain of a severe toothache and has dental caries.  She also states she has not been taking good care of herself and expresses her intent to undergo drug rehabilitation.   Assessment/Plan:  Chest pain in the setting of known history of coronary artery disease Symptoms were somewhat atypical. Care everywhere reviewed.  She [facility].  Showed significant stenosis in the posterior lateral branch of the RCA.  Treated with 2 drug-eluting stents.  Aspirin and Plavix was recommended at that time.  She has probably not been compliant.  There is also report of allergy to aspirin with mention of anaphylaxis. Had only minimal elevation in troponin.  EKG was nonischemic. CT angiogram did not show any PE. TTE revealed EF 60-65% with no wall motion abnormalities.  Continue Plavix. Noted to be on statin. Prior to admission medication list reviewed.  Lisinopril as mentioned.  Compliance is questionable.  Supposed to be on statin as well.  No beta-blocker noted. Mentions a lot of acid reflux.  Could be  reason for her chest discomfort.   - initiated PPI.  Bilateral foot pain No wounds noted.  Peripheral pulses are poorly palpable.  No evidence for ischemia.   ABIs completed and reveal moderate PAD  Microcytic anemia Hemoglobin close to baseline.  Initial value was likely hemoconcentrated. Anemia panel reviewed.  Ferritin 8, iron 60, TIBC 444, percent saturation 14.  Vitamin B12 439.  Folic acid 16.2. Will need further evaluation as outpatient for iron deficiency.  Benefit from iron supplements at discharge.  Diabetes mellitus type 2, uncontrolled with hyperglycemia Prior to admission [facility] suggest that she was on Lantus, NovoLog and metformin.  Compliance is questionable. Came with a glucose level of 409.   Patient on glargine and SSI.  CBGs could be better controlled.   HbA1c 11.4.  - Continue lantus insulin, sliding scale insulin.   Essential hypertension Poorly controlled blood pressures noted.  Home medication list reviewed.  Resumed amlodipine and carvedilol.  Elevated creatinine Creatinine was 1.35 at admission.  Has improved to 1.02.  Unclear if she has CKD.  Polysubstance abuse including tobacco alcohol CIWA protocol in place.  TOC will be consulted to provide outpatient resources.  DVT Prophylaxis: Lovenox Code Status: Full code Family Communication: Discussed with patient Disposition Plan: To be determined   Medications: Scheduled:  amLODipine  5 mg Oral Daily   carvedilol  3.125 mg Oral BID   citalopram  40 mg Oral Daily   clopidogrel  75 mg Oral Daily   enoxaparin (LOVENOX) injection  40 mg Subcutaneous Daily   folic acid  1 mg Oral Daily   insulin aspart  0-5 Units Subcutaneous QHS   insulin aspart  0-9 Units Subcutaneous TID WC   insulin glargine-yfgn  15 Units Subcutaneous Daily   multivitamin with minerals  1 tablet Oral Daily   pantoprazole  40 mg Oral BID   simvastatin  40 mg Oral QPM   thiamine  100 mg Oral Daily   Or   thiamine  100 mg  Intravenous Daily   Continuous:   [id number], LORazepam **OR** LORazepam, melatonin, nitroGLYCERIN, polyethylene glycol, prochlorperazine   Objective:  Vital Signs  Vitals:   03/03/24 0022 03/03/24 0328 03/03/24 0738 03/03/24 1128  BP: (!) 153/86 (!) 163/95 138/88 129/68  Pulse:   69 60  Resp:  17 16 20   Temp: 98.2 F (36.8 C) 98.7 F (37.1 C) 98.3 F (36.8 C) 97.8 F (36.6 C)  TempSrc: Oral Oral Oral Oral  SpO2: 97% 99% 98% 98%  Weight:  54.2 kg    Height:        Intake/Output Summary (Last 24 hours) at 03/03/2024 1542 Last data filed at 03/03/2024 0902 Gross per 24 hour  Intake 200 ml  Output --  Net 200 ml   Filed Weights   03/01/24 0538 03/02/24 0402 03/03/24 0328  Weight: 54.5 kg 54.7 kg 54.2 kg     General: Alert, oriented X3  Eyes: Pupils equal, reactive  Oral cavity: moist mucous membranes, dental caries ++ Head: Atraumatic, normocephalic  Neck: supple  Chest: clear to auscultation. No crackles, no wheezes  CVS: S1,S2 RRR. No murmurs  Abd: No distention, soft, non-tender. No masses palpable  Extr: No edema   MSK: No joint deformities or swelling  Neurological: Grossly intact.     Lab Results:  Data Reviewed: I have personally reviewed following labs and reports of the imaging studies  CBC: Recent Labs  Lab 02/29/24 1532 03/01/24 0239 03/02/24 0241 03/03/24 0247  WBC 8.3 7.6 8.1 9.0  NEUTROABS 5.1  --   --   --   HGB 12.0 9.9* 10.1* 10.2*  HCT 37.3 30.8* 31.4* 31.9*  MCV 78.4* 78.2* 78.9* 79.2*  PLT 301 274 276 293    Basic Metabolic Panel: Recent Labs  Lab 02/29/24 1532 03/01/24 0239 03/02/24 0241 03/03/24 0247  NA 133* 136 132* 133*  K 4.6 3.7 4.1 3.7  CL 98 104 103 102  CO2 24 23 21* 22  GLUCOSE 409* 392* 334* 273*  BUN 23 17 21 19   CREATININE 1.35* 1.06* 1.02* 0.82  CALCIUM 9.4 8.8* 8.5* 8.7*  MG  --  2.2  --   --   PHOS  --  4.4  --   --     GFR: Estimated Creatinine Clearance: 60.1 mL/min (by C-G formula  based on SCr of 0.82 mg/dL).  Liver Function Tests: Recent Labs  Lab 02/29/24 1532 03/02/24 0241 03/03/24 0247  AST 19 15 14*  ALT 19 14 13   ALKPHOS 107 98 71  BILITOT 0.8 0.4 0.5  PROT 8.2* 6.2* 6.4*  ALBUMIN 3.8 2.8* 2.9*    Recent Labs  Lab 02/29/24 1532  LIPASE 43    HbA1C: Recent Labs    03/01/24 0239  HGBA1C 11.4*    CBG: Recent Labs  Lab 03/02/24 1311 03/02/24 1615 03/02/24 2109 03/03/24 0620 03/03/24 1129  GLUCAP 204* 206* 158* 254* 259*        LOS: 2 days   MDALA-GAUSI, Parthenia Tellefsen AGATHA  [facility] Pager on [email or web address]  03/03/2024, 3:42 PM

## 2024-03-04 ENCOUNTER — Other Ambulatory Visit (HOSPITAL_COMMUNITY): Payer: Self-pay

## 2024-03-04 DIAGNOSIS — R079 Chest pain, unspecified: Secondary | ICD-10-CM | POA: Diagnosis not present

## 2024-03-04 LAB — GLUCOSE, CAPILLARY
Glucose-Capillary: 277 mg/dL — ABNORMAL HIGH (ref 70–99)
Glucose-Capillary: 242 mg/dL — ABNORMAL HIGH (ref 70–99)

## 2024-03-04 MED ORDER — SIMVASTATIN 40 MG PO TABS: 40.0000 mg | ORAL_TABLET | Freq: Every evening | ORAL | 1 refills | Status: DC

## 2024-03-04 MED ORDER — CLOPIDOGREL BISULFATE 75 MG PO TABS
75.0000 mg | ORAL_TABLET | Freq: Every day | ORAL | 1 refills | Status: DC
  Filled 2024-03-04: qty 30, 30d supply, fill #0

## 2024-03-04 MED ORDER — FERROUS SULFATE 325 (65 FE) MG PO TABS
325.0000 mg | ORAL_TABLET | Freq: Every day | ORAL | Status: DC
  Administered 2024-03-04: 325 mg via ORAL
  Filled 2024-03-04: qty 1

## 2024-03-04 MED ORDER — CARVEDILOL 3.125 MG PO TABS
3.1250 mg | ORAL_TABLET | Freq: Two times a day (BID) | ORAL | 1 refills | Status: DC
  Filled 2024-03-04: qty 60, 30d supply, fill #0

## 2024-03-04 MED ORDER — INSULIN GLARGINE-YFGN 100 UNIT/ML ~~LOC~~ SOPN
20.0000 [IU] | PEN_INJECTOR | Freq: Every day | SUBCUTANEOUS | 1 refills | Status: DC
  Filled 2024-03-04: qty 3, 15d supply, fill #0

## 2024-03-04 MED ORDER — INSULIN PEN NEEDLE 32G X 4 MM MISC
0 refills | Status: AC
  Filled 2024-03-04: qty 100, 25d supply, fill #0

## 2024-03-04 MED ORDER — ADULT MULTIVITAMIN W/MINERALS CH
1.0000 | ORAL_TABLET | Freq: Every day | ORAL | 1 refills | Status: AC
  Filled 2024-03-04: qty 90, 90d supply, fill #0

## 2024-03-04 MED ORDER — AMLODIPINE BESYLATE 5 MG PO TABS
5.0000 mg | ORAL_TABLET | Freq: Every day | ORAL | 1 refills | Status: DC
  Filled 2024-03-04: qty 30, 30d supply, fill #0

## 2024-03-04 MED ORDER — THIAMINE HCL 100 MG PO TABS
100.0000 mg | ORAL_TABLET | Freq: Every day | ORAL | 1 refills | Status: DC
  Filled 2024-03-04: qty 30, 30d supply, fill #0

## 2024-03-04 MED ORDER — INSULIN ASPART 100 UNIT/ML FLEXPEN
3.0000 [IU] | PEN_INJECTOR | Freq: Three times a day (TID) | SUBCUTANEOUS | 1 refills | Status: DC
  Filled 2024-03-04: qty 3, 28d supply, fill #0

## 2024-03-04 MED ORDER — PANTOPRAZOLE SODIUM 40 MG PO TBEC
40.0000 mg | DELAYED_RELEASE_TABLET | Freq: Every day | ORAL | 0 refills | Status: DC
  Filled 2024-03-04: qty 30, 30d supply, fill #0

## 2024-03-04 NOTE — Progress Notes (Signed)
 Discharge instructions (including medications) discussed with and copy provided to patient/caregiver  Patient going to the lounge for cab voucher and to wait for her medication from TOC.

## 2024-03-04 NOTE — Plan of Care (Signed)
  Problem: Education: Goal: Knowledge of General Education information will improve Description: Including pain rating scale, medication(s)/side effects and non-pharmacologic comfort measures Outcome: Progressing   Problem: Clinical Measurements: Goal: Will remain free from infection Outcome: Progressing   Problem: Clinical Measurements: Goal: Will remain free from infection Outcome: Progressing   Problem: Clinical Measurements: Goal: Diagnostic test results will improve Outcome: Progressing   Problem: Clinical Measurements: Goal: Respiratory complications will improve Outcome: Progressing

## 2024-03-04 NOTE — TOC Transition Note (Addendum)
 Transition of Care [facility]) - Discharge Note   Patient Details  Name: Diana Santos MRN: [id number] Date of Birth: 08/23/1961  [facility] CM/SW Contact:  Leone Haven, RN Phone Number: 03/04/2024, 10:55 AM   Clinical Narrative:    For dc today, Homeless she states she need transportation to her car at the [employer].  Patient states that she is going to [place]  as walk in.  TOC to fill meds, since she has Medicare A and B, but not part D, can not use Match Card, will see what the amount of her meds are.           Patient Goals and CMS Choice            Discharge Placement                       Discharge Plan and Services Additional resources added to the After Visit Summary for                                       Social Drivers of Health (SDOH) Interventions SDOH Screenings   Food Insecurity: Patient Declined (03/01/2024)  Housing: Patient Declined (03/01/2024)  Transportation Needs: Patient Declined (03/01/2024)  Utilities: Patient Declined (03/01/2024)  Tobacco Use: High Risk (02/29/2024)     Readmission Risk Interventions     No data to display

## 2024-03-04 NOTE — Inpatient Diabetes Management (Signed)
 Inpatient Diabetes Program Recommendations  AACE/ADA: New Consensus Statement on Inpatient Glycemic Control (2015)  Target Ranges:  Prepandial:   less than 140 mg/dL      Peak postprandial:   less than 180 mg/dL (1-2 hours)      Critically ill patients:  140 - 180 mg/dL   Lab Results  Component Value Date   GLUCAP 277 (H) 03/04/2024   HGBA1C 11.4 (H) 03/01/2024    Review of Glycemic Control  Latest Reference Range & Units 03/03/24 06:20 03/03/24 11:29 03/03/24 16:11 03/03/24 21:38 03/04/24 06:21  Glucose-Capillary 70 - 99 mg/dL 540 (H) 981 (H) 191 (H) 319 (H) 277 (H)  (H): Data is abnormally high  Diabetes history: DM2 Outpatient Diabetes medications: Lantus 25 units at bedtime, Novolog 4 units mctid + 0-6 units correction TID Current orders for Inpatient glycemic control: Semglee 15 units every day, Novolog 0-9 units TID & 0-5 units QHS  Inpatient Diabetes Program Recommendations:    Pease consider:  Semglee 20 units QAM Novolog 3 units TID with meals if she consumes at least 50%  Will continue to follow while inpatient.  Thank you, Dulce Sellar, MSN, CDCES Diabetes Coordinator Inpatient Diabetes Program [phone] (team pager from 8a-5p)

## 2024-03-04 NOTE — Discharge Summary (Signed)
 Physician Discharge Summary   Patient: Diana Santos MRN: [id number] DOB: 11-Apr-1961  Admit date:     02/29/2024  Discharge date: 03/04/24  Discharge Physician: MDALA-GAUSI, Gwenette Greet   PCP: Patient, No Pcp Per   Recommendations at discharge:   Follow-up with dentistry. Follow-up with PCP  Discharge Diagnoses: Principal Problem:   Chest pain  Resolved Problems:   * No resolved hospital problems. *  Hospital Course: 63 y.o. female with medical history significant for coronary artery disease status post PCI with stent placement a year ago, polysubstance abuse including alcohol and tobacco, who presented at [facility] ED with complaints of intermittent chest pain pressure-like similar to prior symptoms that led to PCI with stent placement.  Associated with a persistent cough.  In the ER, troponin were flat and peaked at 89.  No evidence of acute ischemia on 12-lead EKG. noted to be significantly hyperglycemic.  She was hospitalized for further management.    The hospital course is in problem-based format below:    Assessment and Plan: Chest pain in the setting of known history of coronary artery disease Symptoms were somewhat atypical. Care everywhere reviewed.  She underwent cardiac [facility]ization [facility] in May 2024.  Showed significant stenosis in the posterior lateral branch of the RCA.  Treated with 2 drug-eluting stents.  Aspirin and Plavix was recommended at that time.  She has probably not been compliant.  There is also report of allergy to aspirin with mention of anaphylaxis. Had only minimal elevation in troponin.  EKG was nonischemic. CT angiogram did not show any PE. TTE revealed EF 60-65% with no wall motion abnormalities.  Continue Plavix. Noted to be on statin. Prior to admission medication list reviewed.  Lisinopril as mentioned.  Compliance is questionable.  Supposed to be on statin as well.  No beta-blocker noted. Mentions a lot of acid reflux.  Could be reason for  her chest discomfort.   PPI was initiated. Patient was given new prescriptions for all her medications at discharge.  Filled at [facility] pharmacy.   Bilateral foot pain No wounds noted.  Peripheral pulses are poorly palpable.  No evidence for ischemia.   ABIs completed and reveal moderate PAD   Microcytic anemia Hemoglobin close to baseline.  Initial value was likely hemoconcentrated. Anemia panel reviewed.  Ferritin 8, iron 60, TIBC 444, percent saturation 14.  Vitamin B12 439.  Folic acid 16.2. Will need further evaluation as outpatient for iron deficiency.    Diabetes mellitus type 2, uncontrolled with hyperglycemia Came with a glucose level of 409.   Patient on glargine and SSI but compliance questionable. HbA1c 11.4.  Treated with Lantus insulin, sliding scale insulin while hospitalized.  Discharged home on Lantus and NovoLog insulin.    Essential hypertension Poorly controlled blood pressures noted.  Home medication list reviewed.  Resumed amlodipine and carvedilol.   Acute kidney injury Creatinine was 1.35 at admission.  Likely prerenal due to poor p.o. intake. Creatinine trended down and was 0.82 at discharge.   Polysubstance abuse including tobacco alcohol CIWA protocol in place.  TOC was consulted to provide outpatient resources.      Consultants: n/a Procedures performed: n/a  Disposition: Home Diet recommendation:  Discharge Diet Orders (From admission, onward)     Start     Ordered   03/04/24 0000  Diet Carb Modified        03/04/24 1007           Carb modified diet DISCHARGE MEDICATION: Allergies as of 03/04/2024  Reactions   Aspirin Anaphylaxis, Swelling   Tolerates ibuprofen   Naproxen Sodium Anaphylaxis   Throat swelled up and closed and she had to go to hospital.  York Spaniel she took aspirin in 2023 with no ill effect.        Medication List     STOP taking these medications    benazepril-hydrochlorthiazide 10-12.5 MG tablet Commonly known  as: LOTENSIN HCT   insulin aspart 100 UNIT/ML injection Commonly known as: novoLOG Replaced by: NovoLOG FlexPen 100 UNIT/ML FlexPen   insulin glargine 100 UNIT/ML injection Commonly known as: LANTUS   lisinopril 10 MG tablet Commonly known as: ZESTRIL       TAKE these medications    amLODipine 5 MG tablet Commonly known as: NORVASC Take 1 tablet (5 mg total) by mouth daily.   carvedilol 3.125 MG tablet Commonly known as: COREG Take 1 tablet (3.125 mg total) by mouth 2 (two) times daily.   CertaVite/Antioxidants Tabs Take 1 tablet by mouth daily. Start taking on: March 05, 2024   citalopram 40 MG tablet Commonly known as: CELEXA Take 1 tablet (40 mg total) by mouth daily.   clopidogrel 75 MG tablet Commonly known as: PLAVIX Take 1 tablet (75 mg total) by mouth daily.   insulin glargine-yfgn 100 UNIT/ML Pen Commonly known as: SEMGLEE Inject 20 Units into the skin daily. Start taking on: March 05, 2024   NovoLOG FlexPen 100 UNIT/ML FlexPen Generic drug: insulin aspart Inject 3 Units into the skin 3 (three) times daily with meals. Replaces: insulin aspart 100 UNIT/ML injection   pantoprazole 40 MG tablet Commonly known as: PROTONIX Take 1 tablet (40 mg total) by mouth daily.   simvastatin 40 MG tablet Commonly known as: ZOCOR Take 1 tablet (40 mg total) by mouth every evening.   thiamine 100 MG tablet Commonly known as: VITAMIN B1 Take 1 tablet (100 mg total) by mouth daily. Start taking on: March 05, 2024        Follow-up Information     [place], [place], [place] Follow up.   Specialty: Physician Assistant Why: April 14th @ 2:40PM ID  Insurance Cards  List of Medications Contact information: [address] [place] [address] [phone]                Discharge Exam: Ceasar Mons Weights   03/02/24 0402 03/03/24 0328 03/04/24 0353  Weight: 54.7 kg 54.2 kg 57.3 kg     General: Alert, oriented X3  Eyes: Pupils equal, reactive  Oral  cavity: moist mucous membranes, poor dentition  Head: Atraumatic, normocephalic  Neck: supple  Chest: clear to auscultation. No crackles, no wheezes  CVS: S1,S2 RRR. No murmurs  Abd: No distention, soft, non-tender. No masses palpable  Extr: No edema   MSK: No joint deformities or swelling  Neurological: Grossly intact.    Condition at discharge: fair  The results of significant diagnostics from this hospitalization (including imaging, microbiology, ancillary and laboratory) are listed below for reference.   Imaging Studies: VAS [place] ABI WITH/WO TBI Result Date: 03/03/2024  LOWER EXTREMITY DOPPLER STUDY Patient Name:  KYLEA BERRONG  Date of Exam:   03/02/2024 Medical Rec #: [id number]        Accession #:    [id number] Date of Birth: 1961/09/29         Patient Gender: F Patient Age:   88 years Exam Location:  [facility] Procedure:      VAS [place] ABI WITH/WO TBI Referring Phys: Osvaldo Shipper --------------------------------------------------------------------------------  Indications: Pain. Poorly palpable pulses. Tech noted dark areas on toes and              bottom of feet L > R High Risk         Hypertension, Diabetes, current smoker, coronary artery Factors:          disease. Other Factors: Microcytic anemia.  Limitations: Today's exam was limited due to Mechanical interference. Comparison Study: No prior stud Performing Technologist: Sherren Kerns RVS  Examination Guidelines: A complete evaluation includes at minimum, Doppler waveform signals and systolic blood pressure reading at the level of bilateral brachial, anterior tibial, and posterior tibial arteries, when vessel segments are accessible. Bilateral testing is considered an integral part of a complete examination. Photoelectric Plethysmograph (PPG) waveforms and toe systolic pressure readings are included as required and additional duplex testing as needed. Limited examinations for reoccurring indications may be performed as noted.  ABI  Findings: +---------+------------------+-----+-----------+--------+ Right    Rt Pressure (mmHg)IndexWaveform   Comment  +---------+------------------+-----+-----------+--------+ Brachial 177                    multiphasic         +---------+------------------+-----+-----------+--------+ PTA      182               1.03 monophasic          +---------+------------------+-----+-----------+--------+ DP       149               0.84 monophasic          +---------+------------------+-----+-----------+--------+ Great Toe71                0.40 Abnormal            +---------+------------------+-----+-----------+--------+ +---------+------------------+-----+-----------+-------+ Left     Lt Pressure (mmHg)IndexWaveform   Comment +---------+------------------+-----+-----------+-------+ Brachial 161                    multiphasic        +---------+------------------+-----+-----------+-------+ PTA      130               0.73 monophasic         +---------+------------------+-----+-----------+-------+ DP       134               0.76 monophasic         +---------+------------------+-----+-----------+-------+ Great Toe69                0.39 Abnormal           +---------+------------------+-----+-----------+-------+ +-------+-----------+-----------+------------+------------+ ABI/TBIToday's ABIToday's TBIPrevious ABIPrevious TBI +-------+-----------+-----------+------------+------------+ Right  1.03       0.40                                +-------+-----------+-----------+------------+------------+ Left   0.76       0.39                                +-------+-----------+-----------+------------+------------+ Arterial wall calcification precludes accurate ankle pressures and ABIs.  Summary: Right: Resting right ankle-brachial index is within normal range. The right toe-brachial index is abnormal. Although ankle brachial indices are within normal limits  (0.95-1.29), arterial Doppler waveforms at the ankle suggest some component of arterial occlusive disease. Left: Resting left ankle-brachial index indicates moderate left lower extremity arterial disease. The left toe-brachial index is abnormal. *See  table(s) above for measurements and observations.  Electronically signed by Coral Else MD on 03/03/2024 at 1:30:43 PM.    Final    ECHOCARDIOGRAM COMPLETE Result Date: 03/02/2024    ECHOCARDIOGRAM REPORT   Patient Name:   ANALEIA ISMAEL Date of Exam: 03/02/2024 Medical Rec #:  [id number]       Height:       64.0 in Accession #:    [id number]      Weight:       120.6 lb Date of Birth:  [birth year]        BSA:          1.578 m Patient Age:    63 years        BP:           123/80 mmHg Patient Gender: F               HR:           66 bpm. Exam Location:  Inpatient Procedure: 2D Echo, Color Doppler, Cardiac Doppler, 3D Echo and Strain Analysis            (Both Spectral and Color Flow Doppler were utilized during            procedure). Indications:    Chest Pain  History:        Patient has no prior history of Echocardiogram examinations.                 CAD; Risk Factors:Diabetes, Hypertension and Current Smoker.  Sonographer:    Raeford Razor RDCS Referring Phys: [id number] Osvaldo Shipper IMPRESSIONS  1. Left ventricular ejection fraction, by estimation, is 60 to 65%. Left ventricular ejection fraction by 3D volume is 61 %. The left ventricle has normal function. The left ventricle has no regional wall motion abnormalities. There is mild left ventricular hypertrophy. Left ventricular diastolic parameters are consistent with Grade I diastolic dysfunction (impaired relaxation). The average left ventricular global longitudinal strain is -23.6 %. The global longitudinal strain is normal.  2. Right ventricular systolic function is normal. The right ventricular size is normal.  3. Left atrial size was mildly dilated.  4. The mitral valve is normal in structure. No evidence of mitral valve  regurgitation. No evidence of mitral stenosis. Moderate mitral annular calcification.  5. The aortic valve is normal in structure. Aortic valve regurgitation is not visualized. No aortic stenosis is present.  6. The inferior vena cava is normal in size with greater than 50% respiratory variability, suggesting right atrial pressure of 3 mmHg. FINDINGS  Left Ventricle: Left ventricular ejection fraction, by estimation, is 60 to 65%. Left ventricular ejection fraction by 3D volume is 61 %. The left ventricle has normal function. The left ventricle has no regional wall motion abnormalities. The average left ventricular global longitudinal strain is -23.6 %. Strain was performed and the global longitudinal strain is normal. The left ventricular internal cavity size was normal in size. There is mild left ventricular hypertrophy. Left ventricular diastolic parameters are consistent with Grade I diastolic dysfunction (impaired relaxation). Right Ventricle: The right ventricular size is normal. No increase in right ventricular wall thickness. Right ventricular systolic function is normal. Left [facility]: Left atrial size was mildly dilated. Right [facility]: Right atrial size was normal in size. Pericardium: There is no evidence of pericardial effusion. Mitral Valve: The mitral valve is normal in structure. Moderate mitral annular calcification. No evidence of mitral valve regurgitation. No evidence of mitral valve stenosis. Tricuspid Valve: The  tricuspid valve is normal in structure. Tricuspid valve regurgitation is not demonstrated. No evidence of tricuspid stenosis. Aortic Valve: The aortic valve is normal in structure. Aortic valve regurgitation is not visualized. No aortic stenosis is present. Aortic valve mean gradient measures 4.0 mmHg. Aortic valve peak gradient measures 8.6 mmHg. Aortic valve area, by VTI measures 2.17 cm. Pulmonic Valve: The pulmonic valve was normal in structure. Pulmonic valve regurgitation is not  visualized. No evidence of pulmonic stenosis. Aorta: The aortic root is normal in size and structure. Venous: The inferior vena cava is normal in size with greater than 50% respiratory variability, suggesting right atrial pressure of 3 mmHg. IAS/Shunts: No atrial level shunt detected by color flow Doppler. Additional Comments: 3D was performed not requiring image post processing on an independent workstation and was normal.  LEFT VENTRICLE PLAX 2D LVIDd:         3.20 cm         Diastology LVIDs:         2.40 cm         LV e' medial:    3.92 cm/s LV PW:         1.10 cm         LV E/e' medial:  24.4 LV IVS:        1.20 cm         LV e' lateral:   6.74 cm/s LVOT diam:     1.80 cm         LV E/e' lateral: 14.2 LV SV:         59 LV SV Index:   37              2D Longitudinal LVOT Area:     2.54 cm        Strain                                2D Strain GLS   -23.6 %                                Avg:                                 3D Volume EF                                LV 3D EF:    Left                                             ventricul                                             ar                                             ejection  fraction                                             by 3D                                             volume is                                             61 %.                                 3D Volume EF:                                3D EF:        61 %                                LV EDV:       117 ml                                LV ESV:       46 ml                                LV SV:        71 ml RIGHT VENTRICLE             IVC RV Basal diam:  3.30 cm     IVC diam: 1.00 cm RV S prime:     14.00 cm/s TAPSE (M-mode): 2.3 cm LEFT ATRIUM             Index        RIGHT ATRIUM           Index LA diam:        3.30 cm 2.09 cm/m   RA Area:     11.60 cm LA Vol (A2C):   63.9 ml 40.50 ml/m  RA Volume:   26.70 ml  16.92 ml/m LA Vol  (A4C):   42.7 ml 27.06 ml/m LA Biplane Vol: 53.7 ml 34.03 ml/m  AORTIC VALVE AV Area (Vmax):    1.85 cm AV Area (Vmean):   2.11 cm AV Area (VTI):     2.17 cm AV Vmax:           147.00 cm/s AV Vmean:          89.800 cm/s AV VTI:            0.272 m AV Peak Grad:      8.6 mmHg AV Mean Grad:      4.0 mmHg LVOT Vmax:         107.00 cm/s LVOT Vmean:        74.600 cm/s LVOT VTI:          0.232  m LVOT/AV VTI ratio: 0.85  AORTA Ao Root diam: 2.90 cm Ao Asc diam:  3.30 cm MITRAL VALVE MV Area (PHT): 2.76 cm     SHUNTS MV Decel Time: 275 msec     Systemic VTI:  0.23 m MV E velocity: 95.60 cm/s   Systemic Diam: 1.80 cm MV A velocity: 131.00 cm/s MV E/A ratio:  0.73 Donato Schultz MD Electronically signed by Donato Schultz MD Signature Date/Time: 03/02/2024/4:45:48 PM    Final    CT Angio Chest PE W and/or Wo Contrast Result Date: 02/29/2024 CLINICAL DATA:  Productive cough and chest pain EXAM: CT ANGIOGRAPHY CHEST WITH CONTRAST TECHNIQUE: Multidetector CT imaging of the chest was performed using the standard protocol during bolus administration of intravenous contrast. Multiplanar CT image reconstructions and MIPs were obtained to evaluate the vascular anatomy. RADIATION DOSE REDUCTION: This exam was performed according to the departmental dose-optimization program which includes automated exposure control, adjustment of the mA and/or kV according to patient size and/or use of iterative reconstruction technique. CONTRAST:  75mL OMNIPAQUE IOHEXOL 350 MG/ML SOLN COMPARISON:  Chest x-ray 02/29/2024 FINDINGS: Cardiovascular: Satisfactory opacification of the pulmonary arteries to the segmental level. No evidence of pulmonary embolism. Normal heart size. No pericardial effusion. Nonaneurysmal aorta. Mild atherosclerosis. Mediastinum/Nodes: No enlarged mediastinal, hilar, or axillary lymph nodes. Thyroid gland, trachea, and esophagus demonstrate no significant findings. Lungs/Pleura: Lungs are clear. No pleural effusion or  pneumothorax. Upper Abdomen: No acute abnormality. Musculoskeletal: No chest wall abnormality. No acute or significant osseous findings. Review of the MIP images confirms the above findings. IMPRESSION: 1. Negative for acute pulmonary embolus. 2. Clear lung fields Aortic Atherosclerosis (ICD10-I70.0). Electronically Signed   By: Jasmine Pang M.D.   On: 02/29/2024 19:39   DG Chest 2 View Result Date: 02/29/2024 CLINICAL DATA:  Cough. EXAM: CHEST - 2 VIEW COMPARISON:  November 26, 2023. FINDINGS: The heart size and mediastinal contours are within normal limits. Both lungs are clear. The visualized skeletal structures are unremarkable. IMPRESSION: No active cardiopulmonary disease. Electronically Signed   By: Lupita Raider M.D.   On: 02/29/2024 16:12    Microbiology: Results for orders placed or performed during the hospital encounter of 11/26/23  Resp panel by RT-PCR (RSV, Flu A&B, Covid) Anterior Nasal Swab     Status: None   Collection Time: 11/26/23  4:42 PM   Specimen: Anterior Nasal Swab  Result Value Ref Range Status   SARS Coronavirus 2 by RT PCR NEGATIVE NEGATIVE Final    Comment: (NOTE) SARS-CoV-2 target nucleic acids are NOT DETECTED.  The SARS-CoV-2 RNA is generally detectable in upper respiratory specimens during the acute phase of infection. The lowest concentration of SARS-CoV-2 viral copies this assay can detect is 138 copies/mL. A negative result does not preclude SARS-Cov-2 infection and should not be used as the sole basis for treatment or other patient management decisions. A negative result may occur with  improper specimen collection/handling, submission of specimen other than nasopharyngeal swab, presence of viral mutation(s) within the areas targeted by this assay, and inadequate number of viral copies(<138 copies/mL). A negative result must be combined with clinical observations, patient history, and epidemiological information. The expected result is  Negative.  Fact Sheet for Patients:  BloggerCourse.com  Fact Sheet for Healthcare Providers:  SeriousBroker.it  This test is no t yet approved or cleared by the [place] FDA and  has been authorized for detection and/or diagnosis of SARS-CoV-2 by FDA under an Emergency Use Authorization (EUA). This EUA will  remain  in effect (meaning this test can be used) for the duration of the COVID-19 declaration under Section 564(b)(1) of the Act, 21 U.S.C.section 360bbb-3(b)(1), unless the authorization is terminated  or revoked sooner.       Influenza A by PCR NEGATIVE NEGATIVE Final   Influenza B by PCR NEGATIVE NEGATIVE Final    Comment: (NOTE) The Xpert Xpress SARS-CoV-2/FLU/RSV plus assay is intended as an aid in the diagnosis of influenza from Nasopharyngeal swab specimens and should not be used as a sole basis for treatment. Nasal washings and aspirates are unacceptable for Xpert Xpress SARS-CoV-2/FLU/RSV testing.  Fact Sheet for Patients: BloggerCourse.com  Fact Sheet for Healthcare Providers: SeriousBroker.it  This test is not yet approved or cleared by the [place] FDA and has been authorized for detection and/or diagnosis of SARS-CoV-2 by FDA under an Emergency Use Authorization (EUA). This EUA will remain in effect (meaning this test can be used) for the duration of the COVID-19 declaration under Section 564(b)(1) of the Act, 21 U.S.C. section 360bbb-3(b)(1), unless the authorization is terminated or revoked.     Resp Syncytial Virus by PCR NEGATIVE NEGATIVE Final    Comment: (NOTE) Fact Sheet for Patients: BloggerCourse.com  Fact Sheet for Healthcare Providers: SeriousBroker.it  This test is not yet approved or cleared by the [place] FDA and has been authorized for detection and/or diagnosis of  SARS-CoV-2 by FDA under an Emergency Use Authorization (EUA). This EUA will remain in effect (meaning this test can be used) for the duration of the COVID-19 declaration under Section 564(b)(1) of the Act, 21 U.S.C. section 360bbb-3(b)(1), unless the authorization is terminated or revoked.  Performed at [facility], [address] [place], [place] [address]     Labs: CBC: Recent Labs  Lab 02/29/24 1532 03/01/24 0239 03/02/24 0241 03/03/24 0247  WBC 8.3 7.6 8.1 9.0  NEUTROABS 5.1  --   --   --   HGB 12.0 9.9* 10.1* 10.2*  HCT 37.3 30.8* 31.4* 31.9*  MCV 78.4* 78.2* 78.9* 79.2*  PLT 301 274 276 293   Basic Metabolic Panel: Recent Labs  Lab 02/29/24 1532 03/01/24 0239 03/02/24 0241 03/03/24 0247  NA 133* 136 132* 133*  K 4.6 3.7 4.1 3.7  CL 98 104 103 102  CO2 24 23 21* 22  GLUCOSE 409* 392* 334* 273*  BUN 23 17 21 19   CREATININE 1.35* 1.06* 1.02* 0.82  CALCIUM 9.4 8.8* 8.5* 8.7*  MG  --  2.2  --   --   PHOS  --  4.4  --   --    Liver Function Tests: Recent Labs  Lab 02/29/24 1532 03/02/24 0241 03/03/24 0247  AST 19 15 14*  ALT 19 14 13   ALKPHOS 107 98 71  BILITOT 0.8 0.4 0.5  PROT 8.2* 6.2* 6.4*  ALBUMIN 3.8 2.8* 2.9*   CBG: Recent Labs  Lab 03/03/24 1129 03/03/24 1611 03/03/24 2138 03/04/24 0621 03/04/24 1120  GLUCAP 259* 292* 319* 277* 242*    Discharge time spent: greater than 30 minutes.  Signed: MDALA-GAUSI, Gwenette Greet, MD [facility] 03/04/2024

## 2024-03-04 NOTE — Plan of Care (Signed)

## 2024-03-10 ENCOUNTER — Ambulatory Visit: Admitting: Urgent Care

## 2024-03-10 VITALS — BP 134/74 | HR 74 | Ht 65.0 in | Wt 125.0 lb

## 2024-03-10 DIAGNOSIS — E785 Hyperlipidemia, unspecified: Secondary | ICD-10-CM

## 2024-03-10 DIAGNOSIS — Z789 Other specified health status: Secondary | ICD-10-CM

## 2024-03-10 DIAGNOSIS — Z794 Long term (current) use of insulin: Secondary | ICD-10-CM

## 2024-03-10 DIAGNOSIS — I2510 Atherosclerotic heart disease of native coronary artery without angina pectoris: Secondary | ICD-10-CM

## 2024-03-10 DIAGNOSIS — F332 Major depressive disorder, recurrent severe without psychotic features: Secondary | ICD-10-CM

## 2024-03-10 DIAGNOSIS — E1165 Type 2 diabetes mellitus with hyperglycemia: Secondary | ICD-10-CM | POA: Diagnosis not present

## 2024-03-10 DIAGNOSIS — K219 Gastro-esophageal reflux disease without esophagitis: Secondary | ICD-10-CM | POA: Diagnosis not present

## 2024-03-10 DIAGNOSIS — E1169 Type 2 diabetes mellitus with other specified complication: Secondary | ICD-10-CM | POA: Diagnosis not present

## 2024-03-10 DIAGNOSIS — D509 Iron deficiency anemia, unspecified: Secondary | ICD-10-CM

## 2024-03-10 DIAGNOSIS — F1011 Alcohol abuse, in remission: Secondary | ICD-10-CM

## 2024-03-10 DIAGNOSIS — F1911 Other psychoactive substance abuse, in remission: Secondary | ICD-10-CM

## 2024-03-10 DIAGNOSIS — N179 Acute kidney failure, unspecified: Secondary | ICD-10-CM

## 2024-03-10 LAB — GLUCOSE, POCT (MANUAL RESULT ENTRY): POC Glucose: 412 mg/dL — AB (ref 70–99)

## 2024-03-10 MED ORDER — DEXCOM G7 RECEIVER DEVI: 0 refills | Status: DC

## 2024-03-10 MED ORDER — NALTREXONE HCL (PAIN) 1.5 MG PO CAPS: 3.0000 | ORAL_CAPSULE | Freq: Every day | ORAL | 3 refills | Status: DC

## 2024-03-10 MED ORDER — CITALOPRAM HYDROBROMIDE 40 MG PO TABS
40.0000 mg | ORAL_TABLET | Freq: Every day | ORAL | 1 refills | Status: DC
  Filled 2024-03-12: qty 30, 30d supply, fill #0

## 2024-03-10 MED ORDER — PANTOPRAZOLE SODIUM 40 MG PO TBEC: 40.0000 mg | DELAYED_RELEASE_TABLET | Freq: Every day | ORAL | 1 refills | Status: DC

## 2024-03-10 MED ORDER — DEXCOM G7 SENSOR MISC: 11 refills | Status: DC

## 2024-03-10 NOTE — Patient Instructions (Addendum)
    Please use the above sliding scale as we discussed in office today to determine what your novolog scale should be. Check your sugar before eating three times daily.  I have sent in a sensor and reader for the Dexcom unit.  Continue all meds as ordered. Please re-start celexa; this should help with your depression.  Please start taking naltrexone 3 caps daily to help with carb craving.  Return in 2 weeks for follow up, recheck and labs.

## 2024-03-10 NOTE — Progress Notes (Unsigned)
 New Patient Office Visit  Subjective:  Patient ID: Diana Santos, female    DOB: 1961-07-17  Age: 63 y.o. MRN: [id number]  CC:  Chief Complaint  Patient presents with   Establish Care    New pt est care. Pt will need office notes sent to her rehab facility. She also needs a prescription for a glucometer.    HPI Diana Santos presents to establish care.  Discussed the use of AI scribe software for clinical note transcription with the patient, who gave verbal consent to proceed.  History of Present Illness   Diana Santos is a 63 year old female with coronary artery disease and type 2 diabetes who presents for follow-up after recent hospitalization for chest pain. She is accompanied by her son.  She was hospitalized from April 4th to April 8th due to chest pain. During this hospitalization, she underwent an evaluation for potential heart blockages, given her history of having two stents placed over a year ago. An ultrasound of her heart and leg was performed, revealing no new blockages. Since discharge, she has experienced no chest pain except for occasional muscle-related discomfort with certain movements.  She has a history of type 2 diabetes, which has been poorly controlled for several years. She is currently on insulin therapy, including insulin glargine and Novolog, but lacks a glucometer to regularly check her blood sugar levels. She experiences episodes of dizziness, which she attributes to possible insulin mismanagement. She has strong cravings for sweets and has gained weight recently, from 120 to 125 pounds since her hospitalization.  She has a history of depression and was previously on Celexa, which she thought she was still taking, but it has not been prescribed for over a year. She reports crying episodes and feeling depressed, which she attributes to not being on her medication. She also has a history of auditory and visual hallucinations in the past, likely related to  drug use, but denies any current hallucinations.  She has a history of drug and alcohol addiction, which led to significant personal losses, including her apartment and belongings. She has been sober for two weeks and does not smoke cigarettes. She reports dreams about drug use but no relapse.  She has a history of acid reflux for several years, for which she is taking Protonix, and she reports that it is effective. Additionally, she is on medications for high blood pressure, including carvedilol and amlodipine, and for high cholesterol, as well as Plavix for her stents.       Outpatient Encounter Medications as of 03/10/2024  Medication Sig   amLODipine (NORVASC) 5 MG tablet Take 1 tablet (5 mg total) by mouth daily.   carvedilol (COREG) 3.125 MG tablet Take 1 tablet (3.125 mg total) by mouth 2 (two) times daily.   clopidogrel (PLAVIX) 75 MG tablet Take 1 tablet (75 mg total) by mouth daily.   Continuous Glucose Receiver (DEXCOM G7 RECEIVER) DEVI Use daily with G7 sensor   Continuous Glucose Sensor (DEXCOM G7 SENSOR) MISC Apply sensor for 10 days.   insulin aspart (NOVOLOG) 100 UNIT/ML FlexPen Inject 3 Units into the skin 3 (three) times daily with meals.   insulin glargine-yfgn (SEMGLEE) 100 UNIT/ML Pen Inject 20 Units into the skin daily.   Insulin Pen Needle 32G X 4 MM MISC use as directed with insulin   Multiple Vitamin (MULTIVITAMIN WITH MINERALS) TABS tablet Take 1 tablet by mouth daily.   Naltrexone HCl, Pain, 1.5 MG CAPS Take 3 capsules by mouth daily.  simvastatin (ZOCOR) 40 MG tablet Take 1 tablet (40 mg total) by mouth every evening.   thiamine (VITAMIN B1) 100 MG tablet Take 1 tablet (100 mg total) by mouth daily.   [DISCONTINUED] citalopram (CELEXA) 40 MG tablet Take 1 tablet (40 mg total) by mouth daily.   [DISCONTINUED] pantoprazole (PROTONIX) 40 MG tablet Take 1 tablet (40 mg total) by mouth daily.   citalopram (CELEXA) 40 MG tablet Take 1 tablet (40 mg total) by mouth daily.    pantoprazole (PROTONIX) 40 MG tablet Take 1 tablet (40 mg total) by mouth daily.   No facility-administered encounter medications on file as of 03/10/2024.    Past Medical History:  Diagnosis Date   Arthritis    Asthma    Bipolar 1 disorder, depressed (HCC)    Coronary artery disease    Crack cocaine use    Diabetes mellitus without complication (HCC)    ETOH abuse    Hypertension    Schizoaffective disorder, bipolar type (HCC)    Substance abuse (HCC)     Past Surgical History:  Procedure Laterality Date   C section     CESAREAN SECTION     CORONARY STENT PLACEMENT      No family history on file.  Social History   Socioeconomic History   Marital status: Single    Spouse name: Not on file   Number of children: Not on file   Years of education: Not on file   Highest education level: Not on file  Occupational History   Not on file  Tobacco Use   Smoking status: Some Days    Current packs/day: 1.00    Types: Cigarettes   Smokeless tobacco: Never  Vaping Use   Vaping status: Never Used  Substance and Sexual Activity   Alcohol use: Yes    Comment: daily   Drug use: Not Currently    Types: "Crack" cocaine   Sexual activity: Not on file  Other Topics Concern   Not on file  Social History Narrative   Not on file   Social Drivers of Health   Financial Resource Strain: Not on file  Food Insecurity: Patient Declined (03/01/2024)   Hunger Vital Sign    Worried About Running Out of Food in the Last Year: Patient declined    Ran Out of Food in the Last Year: Patient declined  Transportation Needs: Patient Declined (03/01/2024)   PRAPARE - Administrator, Civil Service (Medical): Patient declined    Lack of Transportation (Non-Medical): Patient declined  Physical Activity: Not on file  Stress: Not on file  Social Connections: Not on file  Intimate Partner Violence: Patient Declined (03/01/2024)   Humiliation, Afraid, Rape, and Kick questionnaire    Fear  of Current or Ex-Partner: Patient declined    Emotionally Abused: Patient declined    Physically Abused: Patient declined    Sexually Abused: Patient declined    ROS: as noted in HPI  Objective:  BP 134/74   Pulse 74   Ht 5\' 5"  (1.651 m)   Wt 125 lb (56.7 kg)   LMP 06/30/2013   SpO2 100%   BMI 20.80 kg/m   Physical Exam  Last CBC Lab Results  Component Value Date   WBC 9.0 03/03/2024   HGB 10.2 (L) 03/03/2024   HCT 31.9 (L) 03/03/2024   MCV 79.2 (L) 03/03/2024   MCH 25.3 (L) 03/03/2024   RDW 19.0 (H) 03/03/2024   PLT 293 03/03/2024   Last metabolic  panel Lab Results  Component Value Date   GLUCOSE 273 (H) 03/03/2024   NA 133 (L) 03/03/2024   K 3.7 03/03/2024   CL 102 03/03/2024   CO2 22 03/03/2024   BUN 19 03/03/2024   CREATININE 0.82 03/03/2024   GFRNONAA >60 03/03/2024   CALCIUM 8.7 (L) 03/03/2024   PHOS 4.4 03/01/2024   PROT 6.4 (L) 03/03/2024   ALBUMIN 2.9 (L) 03/03/2024   BILITOT 0.5 03/03/2024   ALKPHOS 71 03/03/2024   AST 14 (L) 03/03/2024   ALT 13 03/03/2024   ANIONGAP 9 03/03/2024   Last lipids Lab Results  Component Value Date   CHOL 176 03/01/2024   HDL 75 03/01/2024   LDLCALC 74 03/01/2024   TRIG 135 03/01/2024   CHOLHDL 2.3 03/01/2024   Last hemoglobin A1c Lab Results  Component Value Date   HGBA1C 11.4 (H) 03/01/2024   Last thyroid functions No results found for: "TSH", "T3TOTAL", "T4TOTAL", "THYROIDAB" Last vitamin D No results found for: "25OHVITD2", "25OHVITD3", "VD25OH" Last vitamin B12 and Folate Lab Results  Component Value Date   VITAMINB12 439 03/02/2024   FOLATE 16.2 03/02/2024      Assessment & Plan:  Transition from acute care to long-term care  Severe episode of recurrent major depressive disorder, without psychotic features (HCC) -     Citalopram Hydrobromide; Take 1 tablet (40 mg total) by mouth daily.  Dispense: 90 tablet; Refill: 1  Hyperlipidemia associated with type 2 diabetes mellitus  (HCC)  Gastroesophageal reflux disease, unspecified whether esophagitis present -     Pantoprazole Sodium; Take 1 tablet (40 mg total) by mouth daily.  Dispense: 90 tablet; Refill: 1  History of alcohol abuse  History of drug abuse in remission [facility])  Coronary artery disease involving native heart without angina pectoris, unspecified vessel or lesion type  Type 2 diabetes mellitus with hyperglycemia, with long-term current use of insulin (HCC) -     Dexcom G7 Receiver; Use daily with G7 sensor  Dispense: 1 each; Refill: 0 -     Dexcom G7 Sensor; Apply sensor for 10 days.  Dispense: 3 each; Refill: 11 -     Naltrexone HCl (Pain); Take 3 capsules by mouth daily.  Dispense: 90 capsule; Refill: 3 -     POCT glucose (manual entry)  Acute kidney injury (HCC)  Microcytic anemia  Assessment and Plan    Chest Pain Recent hospitalization for chest pain; no new blockages found. Current muscle pain with movement, no chest pain. - Continue Plavix for stents.  Type 2 Diabetes Mellitus Poorly controlled diabetes with A1c of 11.4. Dizziness likely due to insulin without proper glucose monitoring. Discussed Dexcom for better glucose control. - Order Dexcom glucose monitoring system with receiver. - Educate on insulin glargine (20 units at night) and Novolog sliding scale. - Prescribe naltrexone 1.5 mg for sweet cravings, take three tablets daily.  Depression Crying episodes and emotional distress. No hallucinations. Discussed restarting Celexa to assess response. - Restart Celexa (citalopram). - Monitor response to Celexa before considering additional medications.  Hypertension Well-controlled with carvedilol and amlodipine.  Hyperlipidemia On medication for hyperlipidemia.  Gastroesophageal Reflux Disease (GERD) Well-controlled with Protonix. - Continue Protonix.  Substance Use Disorder in Remission Two weeks sober, experiencing drug-related dreams but no relapse. Discussed  importance of sobriety and support. - Provide support for continued sobriety. - Facilitate referral to [facility] for further support.        Return in about 2 weeks (around 03/24/2024).   Mandy Second, PA

## 2024-03-11 ENCOUNTER — Encounter: Payer: Self-pay | Admitting: Urgent Care

## 2024-03-11 ENCOUNTER — Ambulatory Visit: Payer: Self-pay

## 2024-03-11 NOTE — Telephone Encounter (Signed)
 Please advise. I will be faxing her office note to the facility today

## 2024-03-11 NOTE — Telephone Encounter (Signed)
  Chief Complaint: medication cost Symptoms: depression. Frequency: N/A Pertinent Negatives: Patient denies self harm thoughts or plans. Disposition: [] ED /[] Urgent Care (no appt availability in office) / [] Appointment(In office/virtual)/ []  [facility]/ [] Home Care/ [] Refused Recommended Disposition /[] [place] [facility]/ [x]  Follow-up with PCP Additional Notes: Patient saw Corita Diego PA yesterday. She was prescribed citalopram (Celexa), Naltrexone and pantoprazole, Dexcom. She states they are too expensive and can not afford the medications. Patient asking if the prescribed can change her prescriptions to something more affordable. Patient would also like an update on the referral to [facility]. Patient states she called in this morning and was told they were going to fax it. Patient has a referral on 03/02/24 with limited information, unable to give an update.  Copied from CRM [id number]. Topic: Clinical - Red Word Triage >> Mar 11, 2024  1:36 PM Diana Santos wrote: [facility] that prompted transfer to Nurse Triage: Patient called in regarding medication  citalopram (CELEXA) 40 MG tablet  , is crying and very upset because it was prescribed to her yesterday but she is unable to afford it and can't get it Reason for Disposition  [1] Follow-up call from patient regarding patient's clinical status AND [2] information NON-URGENT  Answer Assessment - Initial Assessment Questions 1. REASON FOR CALL or QUESTION: "What is your reason for calling today?" or "How can I best help you?" or "What question do you have that I can help answer?"     Patient states she can not afford her prescriptions that were sent in yesterday. She needs assistance with cost or more affordable options to be prescribed. She also would like the referral sent to [facility] as soon as possible.  2. CALLER: Document the source of call. (e.g., laboratory, patient).     Patient.  Protocols used: PCP Call - No Triage-A-AH

## 2024-03-11 NOTE — Telephone Encounter (Signed)
 Note is completed - let me know what else I need to do to help patient. Does [facility] have assistance programs?

## 2024-03-11 NOTE — Telephone Encounter (Signed)
 Spoke with pt and pharmacy. Pt does not have active medicaid and she does not have part D on her medicare policy. Pantoprazole and Celexa are $28 and there was an issue with Naltrexone. The pharmacy will send over info

## 2024-03-12 ENCOUNTER — Encounter: Payer: Self-pay | Admitting: Pharmacist

## 2024-03-12 ENCOUNTER — Other Ambulatory Visit (HOSPITAL_BASED_OUTPATIENT_CLINIC_OR_DEPARTMENT_OTHER): Payer: Self-pay

## 2024-03-12 NOTE — Progress Notes (Signed)
 03/12/2024 Name: Diana Santos MRN: [id number] DOB: 05-Jan-1961  Chief Complaint  Patient presents with   Medication Problem    Medication cost    Diana Santos is a 63 y.o. year old female who presented for a telephone visit.   They were referred to the pharmacist by PCP team for assistance in managing medication access.    Subjective:   Medication Access/Adherence  Received phone message that patient has not been able to afford her medications - specifically pantoprazole  40mg , citalopram  40mg  and naltrexone  1.5mg .  Diana Santos is awaiting acceptance into rehab hospital. She previously had Medicaid and Medicare coverage but she reports she lost Medicaid coverage in Feb 2025. She has not been able to afford to get some medications [facility].   Current Pharmacy:  [id number] - [place], [place] - [address] [place] [address] Phone: [phone] Fax: [phone]  [facility] [address] [place] [address] Phone: [phone] Fax: [phone]   Patient reports affordability concerns with their medications: Yes  Patient reports access/transportation concerns to their pharmacy: No  - son helps with picking up prescriptions for her.  Patient reports adherence concerns with their medications:  No      Objective:  Lab Results  Component Value Date   HGBA1C 11.4 (H) 03/01/2024    Lab Results  Component Value Date   CREATININE 0.82 03/03/2024   BUN 19 03/03/2024   NA 133 (L) 03/03/2024   K 3.7 03/03/2024   CL 102 03/03/2024   CO2 22 03/03/2024    Lab Results  Component Value Date   CHOL 176 03/01/2024   HDL 75 03/01/2024   LDLCALC 74 03/01/2024   TRIG 135 03/01/2024   CHOLHDL 2.3 03/01/2024    Medications Reviewed Today     Reviewed by Cecilie Coffee, RPH-CPP (Pharmacist) on 03/12/24 at [phone]  Med List Status: <None>   Medication Order Taking? Sig  Documenting Provider Last Dose Status Informant  amLODipine  (NORVASC ) 5 MG tablet [id number] No Take 1 tablet (5 mg total) by mouth daily. Mdala-Gausi, Masiku Agatha, MD Taking Active   carvedilol  (COREG ) 3.125 MG tablet [id number] No Take 1 tablet (3.125 mg total) by mouth 2 (two) times daily. Mdala-Gausi, Masiku Agatha, MD Taking Active   citalopram  (CELEXA ) 40 MG tablet [id number]  Take 1 tablet (40 mg total) by mouth daily. Crain, Whitney L, PA  Active   clopidogrel  (PLAVIX ) 75 MG tablet [id number] No Take 1 tablet (75 mg total) by mouth daily. Mdala-Gausi, Masiku Agatha, MD Taking Active   Continuous Glucose Receiver [facility] G7 RECEIVER) DEVI [id number]  Use daily with G7 sensor Devine, [place] L, [place]  Active   Continuous Glucose Sensor (DEXCOM G7 SENSOR) MISC [id number]  Apply sensor for 10 days. Crain, Whitney L, PA  Active   insulin  aspart (NOVOLOG ) 100 UNIT/ML FlexPen [id number] No Inject 3 Units into the skin 3 (three) times daily with meals. Mdala-Gausi, Masiku Agatha, MD Taking Active   insulin  glargine-yfgn (SEMGLEE ) 100 UNIT/ML Pen [id number] No Inject 20 Units into the skin daily. Mdala-Gausi, Jilda Most, MD Taking Active   Insulin  Pen Needle 32G X 4 MM MISC [id number] No use as directed with insulin  Elgergawy, Dawood S, MD Taking Active   Multiple Vitamin (MULTIVITAMIN WITH MINERALS) TABS tablet [id number] No Take 1 tablet by mouth daily. Mdala-Gausi, Masiku Agatha, MD Taking Active   Naltrexone  HCl, Pain, 1.5 MG CAPS  [id number]  Take 3 capsules by mouth daily. Crain, Whitney L, PA  Active   pantoprazole  (PROTONIX ) 40 MG tablet [id number]  Take 1 tablet (40 mg total) by mouth daily. Crain, Whitney L, PA  Active   simvastatin  (ZOCOR ) 40 MG tablet [id number] No Take 1 tablet (40 mg total) by mouth every evening. Mdala-Gausi, Jilda Most, MD Taking Active   thiamine  (VITAMIN B1) 100 MG tablet [id number] No Take 1 tablet (100 mg total) by mouth daily. Mdala-Gausi, Jilda Most, MD Taking Active                Assessment/Plan:   Medication Management / Access:  - Reviewed $5 list from [facility] - patient would like to have prescriptions for pantoprazole  and citalopram  transferred to [facility].  - Checked to see if [facility] was able to order naltrexone  1.5mg  (dose ofr pain / fibromylagia cheked) - this dose is not available thru their wholesaler. Patient might be able to check with a compounding pharmacy.  - Provided applications for Novolog  and Semglee  to son (he asked that I fax to [phone])   Cecilie Coffee, PharmD Clinical Pharmacist [facility]  Population Health [phone]

## 2024-03-12 NOTE — Telephone Encounter (Signed)
 Hey Tammy, So I was actually using the naltrexone for carb craving to help with her diabetes management. She is alcohol free without any adverse side effects. She also did not complain of pain. The usual dose I have done for this is 4mg , but I couldn't find that dose in Epic, so figured three 1.5mg  tabs would be close enough. Let me know if that makes sense, and if we are able to get it covered.  Thanks, [employer]

## 2024-03-24 ENCOUNTER — Ambulatory Visit (INDEPENDENT_AMBULATORY_CARE_PROVIDER_SITE_OTHER): Admitting: Urgent Care

## 2024-03-24 ENCOUNTER — Encounter: Payer: Self-pay | Admitting: Urgent Care

## 2024-03-24 ENCOUNTER — Ambulatory Visit (HOSPITAL_BASED_OUTPATIENT_CLINIC_OR_DEPARTMENT_OTHER)
Admission: RE | Admit: 2024-03-24 | Discharge: 2024-03-24 | Disposition: A | Discharge status: Home / Self Care | Source: Ambulatory Visit | Attending: Urgent Care | Admitting: Urgent Care

## 2024-03-24 ENCOUNTER — Other Ambulatory Visit (HOSPITAL_BASED_OUTPATIENT_CLINIC_OR_DEPARTMENT_OTHER): Payer: Self-pay

## 2024-03-24 VITALS — BP 140/88 | HR 76 | Wt 130.1 lb

## 2024-03-24 DIAGNOSIS — D509 Iron deficiency anemia, unspecified: Secondary | ICD-10-CM | POA: Diagnosis not present

## 2024-03-24 DIAGNOSIS — F332 Major depressive disorder, recurrent severe without psychotic features: Secondary | ICD-10-CM | POA: Diagnosis not present

## 2024-03-24 DIAGNOSIS — M2141 Flat foot [pes planus] (acquired), right foot: Secondary | ICD-10-CM

## 2024-03-24 DIAGNOSIS — M2142 Flat foot [pes planus] (acquired), left foot: Secondary | ICD-10-CM

## 2024-03-24 DIAGNOSIS — N179 Acute kidney failure, unspecified: Secondary | ICD-10-CM

## 2024-03-24 DIAGNOSIS — M25561 Pain in right knee: Secondary | ICD-10-CM | POA: Insufficient documentation

## 2024-03-24 DIAGNOSIS — E1165 Type 2 diabetes mellitus with hyperglycemia: Secondary | ICD-10-CM | POA: Diagnosis not present

## 2024-03-24 DIAGNOSIS — L84 Corns and callosities: Secondary | ICD-10-CM

## 2024-03-24 DIAGNOSIS — I2510 Atherosclerotic heart disease of native coronary artery without angina pectoris: Secondary | ICD-10-CM

## 2024-03-24 DIAGNOSIS — K219 Gastro-esophageal reflux disease without esophagitis: Secondary | ICD-10-CM

## 2024-03-24 DIAGNOSIS — F1911 Other psychoactive substance abuse, in remission: Secondary | ICD-10-CM

## 2024-03-24 DIAGNOSIS — E1169 Type 2 diabetes mellitus with other specified complication: Secondary | ICD-10-CM

## 2024-03-24 DIAGNOSIS — K029 Dental caries, unspecified: Secondary | ICD-10-CM

## 2024-03-24 DIAGNOSIS — G8929 Other chronic pain: Secondary | ICD-10-CM

## 2024-03-24 DIAGNOSIS — F1011 Alcohol abuse, in remission: Secondary | ICD-10-CM

## 2024-03-24 DIAGNOSIS — Z794 Long term (current) use of insulin: Secondary | ICD-10-CM | POA: Diagnosis not present

## 2024-03-24 LAB — CBC WITH DIFFERENTIAL/PLATELET
Basophils Absolute: 0.1 10*3/uL (ref 0.0–0.1)
Basophils Relative: 0.7 % (ref 0.0–3.0)
Eosinophils Absolute: 0.1 10*3/uL (ref 0.0–0.7)
Eosinophils Relative: 0.6 % (ref 0.0–5.0)
HCT: 33.8 % — ABNORMAL LOW (ref 36.0–46.0)
Hemoglobin: 10.9 g/dL — ABNORMAL LOW (ref 12.0–15.0)
Lymphocytes Relative: 20.5 % (ref 12.0–46.0)
Lymphs Abs: 1.9 10*3/uL (ref 0.7–4.0)
MCHC: 32.3 g/dL (ref 30.0–36.0)
MCV: 79.2 fl (ref 78.0–100.0)
Monocytes Absolute: 0.6 10*3/uL (ref 0.1–1.0)
Monocytes Relative: 6.6 % (ref 3.0–12.0)
Neutro Abs: 6.5 10*3/uL (ref 1.4–7.7)
Neutrophils Relative %: 71.6 % (ref 43.0–77.0)
Platelets: 238 10*3/uL (ref 150.0–400.0)
RBC: 4.27 Mil/uL (ref 3.87–5.11)
RDW: 20 % — ABNORMAL HIGH (ref 11.5–15.5)
WBC: 9.1 10*3/uL (ref 4.0–10.5)

## 2024-03-24 LAB — COMPREHENSIVE METABOLIC PANEL WITH GFR
ALT: 17 U/L (ref 0–35)
AST: 11 U/L (ref 0–37)
Albumin: 4 g/dL (ref 3.5–5.2)
Alkaline Phosphatase: 123 U/L — ABNORMAL HIGH (ref 39–117)
BUN: 24 mg/dL — ABNORMAL HIGH (ref 6–23)
CO2: 28 meq/L (ref 19–32)
Calcium: 9.6 mg/dL (ref 8.4–10.5)
Chloride: 96 meq/L (ref 96–112)
Creatinine, Ser: 0.95 mg/dL (ref 0.40–1.20)
GFR: 63.89 mL/min (ref >60.00)
Glucose, Bld: 428 mg/dL — ABNORMAL HIGH (ref 70–99)
Potassium: 4.5 meq/L (ref 3.5–5.1)
Sodium: 132 meq/L — ABNORMAL LOW (ref 135–145)
Total Bilirubin: 0.6 mg/dL (ref 0.2–1.2)
Total Protein: 7.4 g/dL (ref 6.0–8.3)

## 2024-03-24 LAB — MICROALBUMIN / CREATININE URINE RATIO
Creatinine,U: 31.3 mg/dL
Microalb Creat Ratio: UNDETERMINED mg/g (ref 0.0–30.0)
Microalb, Ur: <0.7 mg/dL

## 2024-03-24 LAB — GLUCOSE, POCT (MANUAL RESULT ENTRY): POC Glucose: 509 mg/dL — AB (ref 70–99)

## 2024-03-24 MED ORDER — PANTOPRAZOLE SODIUM 40 MG PO TBEC
40.0000 mg | DELAYED_RELEASE_TABLET | Freq: Every day | ORAL | 1 refills | Status: DC
  Filled 2024-03-24: qty 90, 90d supply, fill #0

## 2024-03-24 MED ORDER — DEXCOM G7 SENSOR MISC
11 refills | Status: AC
  Filled 2024-03-24: qty 3, 30d supply, fill #0

## 2024-03-24 MED ORDER — NALTREXONE POWD
4.0000 mg | Freq: Every day | 0 refills | Status: AC
  Filled 2024-03-24: qty 500, 30d supply, fill #0

## 2024-03-24 MED ORDER — INSULIN ASPART 100 UNIT/ML FLEXPEN
3.0000 [IU] | PEN_INJECTOR | Freq: Three times a day (TID) | SUBCUTANEOUS | 1 refills | Status: DC
Start: 2024-03-24 — End: 2024-04-25
  Filled 2024-03-24: qty 9, 84d supply, fill #0

## 2024-03-24 MED ORDER — CITALOPRAM HYDROBROMIDE 40 MG PO TABS: 40.0000 mg | ORAL_TABLET | Freq: Every day | ORAL | 1 refills | Status: DC

## 2024-03-24 MED ORDER — DEXCOM G7 RECEIVER DEVI
0 refills | Status: AC
  Filled 2024-03-24: qty 1, 30d supply, fill #0

## 2024-03-24 MED ORDER — PENICILLIN V POTASSIUM 500 MG PO TABS
500.0000 mg | ORAL_TABLET | Freq: Two times a day (BID) | ORAL | 0 refills | Status: AC
  Filled 2024-03-24: qty 20, 10d supply, fill #0

## 2024-03-24 MED ORDER — DEXCOM G7 RECEIVER DEVI: 0 refills | Status: DC

## 2024-03-24 NOTE — Progress Notes (Signed)
 Established Patient Office Visit  Subjective:  Patient ID: Diana Santos, female    DOB: 03/30/1961  Age: 63 y.o. MRN: [id number]  Chief Complaint  Patient presents with   Follow-up    2 week follow up. She states her sugar has still been elevated.     HPI  Discussed the use of AI scribe software for clinical note transcription with the patient, who gave verbal consent to proceed.  History of Present Illness   Diana Santos is a 63 year old female with diabetes who presents with uncontrolled blood sugar levels.  She has been experiencing difficulty managing her diabetes due to issues with medication access. She was unable to obtain metoprolol and has not been able to check her blood sugar regularly because her glucose monitor was in her daughter's car. Recently, her blood sugar was 561 mg/dL before leaving home. She uses both long-acting (Semglee ) and short-acting insulin , administering 20 units of Semglee  at night and varying doses of short-acting insulin , often guessing the amount based on how she feels. She took 12 units of short-acting insulin  this morning when her blood sugar was 561 mg/dL.  She has not been able to obtain a medication intended to reduce carbohydrate cravings due to availability issues at the pharmacy. She received a message from [facility] about the medication being unavailable and needing to get it from a compounding pharmacy, which does not accept insurance.  She feels 'lopsided' and experiences fatigue, particularly when walking short distances. Her legs feel worn out, and she reports having no cartilage in her knee, causing significant pain. She has not had knee imaging recently but recalls receiving a shot for her knee years ago.  She takes six medications in the morning, including several insulins, but has not picked up Celexa . Her son was faxed applications for patient assistance programs for her insulin  medications.  She feels tired and sluggish, impacting her  ability to walk and keep up with her grandchildren. She also has flat feet and corns and calluses on her toes, affecting her sensation in her feet. She has gained five pounds recently and wants to feel normal and energetic again.   Additionally patient is requesting assistance with her dental health. Has many decaying teeth, needs referral for dentist.     Patient Active Problem List   Diagnosis Date Noted   Hyperlipidemia associated with type 2 diabetes mellitus (HCC) 03/10/2024   Gastroesophageal reflux disease 03/10/2024   Coronary artery disease involving native heart without angina pectoris 03/10/2024   Acute kidney injury (HCC) 03/10/2024   Microcytic anemia 03/10/2024   Chest pain 02/29/2024   Suicidal ideation    Cocaine dependence with cocaine-induced mood disorder (HCC)    Severe recurrent major depressive disorder with psychotic features (HCC)    Substance abuse (HCC)    Alcohol  use disorder, severe, dependence (HCC) 02/25/2015   Diabetes (HCC) 04/08/2014   Major depression 04/07/2014   Alcohol  dependence (HCC) 08/26/2013   Cocaine dependence (HCC) 08/26/2013   Severe major depression with psychotic features (HCC) 08/26/2013   Past Medical History:  Diagnosis Date   Arthritis    Asthma    Bipolar 1 disorder, depressed (HCC)    Coronary artery disease    Crack cocaine use    Diabetes mellitus without complication (HCC)    ETOH abuse    Hypertension    Schizoaffective disorder, bipolar type (HCC)    Substance abuse (HCC)    Past Surgical History:  Procedure Laterality Date   C  section     CESAREAN SECTION     CORONARY STENT PLACEMENT     Social History   Tobacco Use   Smoking status: Some Days    Current packs/day: 1.00    Types: Cigarettes   Smokeless tobacco: Never  Vaping Use   Vaping status: Never Used  Substance Use Topics   Alcohol  use: Yes    Comment: daily   Drug use: Not Currently    Types: "Crack" cocaine      ROS: as noted in  HPI  Objective:     BP (!) 140/88   Pulse 76   Wt 130 lb 1.9 oz (59 kg)   LMP 06/30/2013   SpO2 100%   BMI 21.65 kg/m  BP Readings from Last 3 Encounters:  03/24/24 (!) 140/88  03/10/24 134/74  03/04/24 (!) 140/86   Wt Readings from Last 3 Encounters:  03/24/24 130 lb 1.9 oz (59 kg)  03/10/24 125 lb (56.7 kg)  03/04/24 126 lb 5.2 oz (57.3 kg)      Physical Exam Vitals and nursing note reviewed.  Constitutional:      General: She is not in acute distress.    Appearance: Normal appearance. She is not ill-appearing, toxic-appearing or diaphoretic.  HENT:     Head: Normocephalic and atraumatic.     Right Ear: Tympanic membrane, ear canal and external ear normal. There is no impacted cerumen.     Left Ear: Tympanic membrane, ear canal and external ear normal. There is no impacted cerumen.     Nose: Nose normal.     Mouth/Throat:     Mouth: Mucous membranes are moist.     Pharynx: Oropharynx is clear. No oropharyngeal exudate or posterior oropharyngeal erythema.  Eyes:     General: No scleral icterus.       Right eye: No discharge.        Left eye: No discharge.     Extraocular Movements: Extraocular movements intact.     Pupils: Pupils are equal, round, and reactive to light.  Neck:     Thyroid: No thyroid mass, thyromegaly or thyroid tenderness.  Cardiovascular:     Rate and Rhythm: Normal rate and regular rhythm.     Pulses: Normal pulses.     Heart sounds: No murmur heard. Pulmonary:     Effort: Pulmonary effort is normal. No respiratory distress.     Breath sounds: Normal breath sounds. No stridor. No wheezing or rhonchi.  Abdominal:     General: Abdomen is flat. Bowel sounds are normal. There is no distension.     Palpations: Abdomen is soft. There is no mass.     Tenderness: There is no abdominal tenderness. There is no guarding.  Musculoskeletal:        General: Deformity (heberdens/ bouchards nodes to hands) present.     Cervical back: Normal range of  motion and neck supple. No rigidity or tenderness.     Right lower leg: No edema.     Left lower leg: No edema.     Comments: Discomfort to R knee with ROM medially  Feet:     Right foot:     Protective Sensation: 10 sites tested.  6 sites sensed.     Skin integrity: Callus and dry skin present. No ulcer or blister.     Left foot:     Protective Sensation: 10 sites tested.  6 sites sensed.     Skin integrity: Callus and dry skin present. No ulcer  or blister.     Comments: Areas of decreased sensation are calloused and thick, dry skin. Lymphadenopathy:     Cervical: No cervical adenopathy.  Skin:    General: Skin is warm and dry.     Coloration: Skin is not jaundiced.     Findings: No bruising, erythema or rash.  Neurological:     General: No focal deficit present.     Mental Status: She is alert and oriented to person, place, and time.     Sensory: No sensory deficit.     Motor: No weakness.  Psychiatric:        Mood and Affect: Mood normal.        Behavior: Behavior normal.          Last lipids Lab Results  Component Value Date   CHOL 176 03/01/2024   HDL 75 03/01/2024   LDLCALC 74 03/01/2024   TRIG 135 03/01/2024   CHOLHDL 2.3 03/01/2024   Last hemoglobin A1c Lab Results  Component Value Date   HGBA1C 11.4 (H) 03/01/2024   Last vitamin B12 and Folate Lab Results  Component Value Date   VITAMINB12 439 03/02/2024   FOLATE 16.2 03/02/2024      The ASCVD Risk score (Arnett DK, et al., 2019) failed to calculate for the following reasons:   Risk score cannot be calculated because patient has a medical history suggesting prior/existing ASCVD  Assessment & Plan:  Type 2 diabetes mellitus with hyperglycemia, with long-term current use of insulin  (HCC) -     Microalbumin / creatinine urine ratio -     POCT glucose (manual entry) -     CBC with Differential/Platelet -     Comprehensive metabolic panel with GFR -     Dexcom G7 Sensor; Apply sensor on arm as  directed and change every 10 days.  Dispense: 3 each; Refill: 11 -     Naltrexone ; Take 4 mg by mouth daily.  Dispense: 500 g; Refill: 0 -     Dexcom G7 Receiver; Use daily with G7 sensor.  Dispense: 1 each; Refill: 0  Microcytic anemia -     CBC with Differential/Platelet  Hyperlipidemia associated with type 2 diabetes mellitus (HCC)  Coronary artery disease involving native heart without angina pectoris, unspecified vessel or lesion type  Severe episode of recurrent major depressive disorder, without psychotic features (HCC) -     Citalopram  Hydrobromide; Take 1 tablet (40 mg total) by mouth daily.  Dispense: 90 tablet; Refill: 1  Gastroesophageal reflux disease, unspecified whether esophagitis present -     Pantoprazole  Sodium; Take 1 tablet (40 mg total) by mouth daily.  Dispense: 90 tablet; Refill: 1  History of alcohol  abuse  History of drug abuse in remission (HCC)  Acute kidney injury (HCC) -     Comprehensive metabolic panel with GFR  Pes planus of both feet -     Ambulatory referral to Podiatry  Corn or callus -     Ambulatory referral to Podiatry  Chronic pain of right knee -     DG Knee Complete 4 Views Right; Future  Dental decay -     Ambulatory referral to Dentistry -     Penicillin V Potassium; Take 1 tablet (500 mg total) by mouth in the morning and at bedtime for 10 days.  Dispense: 20 tablet; Refill: 0  Other orders -     Insulin  Aspart; Inject 3 Units into the skin 3 (three) times daily with meals.  Dispense: 15  mL; Refill: 1  Assessment and Plan    Type 2 diabetes mellitus with hyperglycemia Severe hyperglycemia due to inconsistent medication adherence and lack of glucose monitoring. Current insulin  regimen not optimized. Discussed need for consistent glucose monitoring and medication adherence. Provided sliding scale for NovoLog  dosing. - Increase Semglee  to 25 units at night for 5 days, then increase by 3 units every 5 days until fasting glucose  goals are achieved. (120-140) - Provide sliding scale for NovoLog  dosing based on pre-meal glucose levels. - Send applications for patient assistance programs for insulin  to her son. - Order repeat labs to monitor glucose control. - Send prescriptions for pantoprazole  and citalopram  to [facility]. - Attempt to order naltrexone  from [facility] or consider compounding pharmacy if unavailable.  Peripheral neuropathy due to diabetes Peripheral neuropathy with decreased sensation in feet, likely secondary to diabetes. Discussed referral to podiatrist for foot assessment and management of corns and calluses. - Refer to podiatrist for foot assessment and management of corns and calluses.  Dental decay Numerous decaying teeth, poor oral hygiene. Pt c/o gum pain - start PCN VK to cover for infection - referral placed to dentist  Knee osteoarthritis Chronic right knee pain with cartilage loss. Discussed obtaining a knee X-ray to assess current status of osteoarthritis. - Order knee X-ray at [facility] location to assess current status of osteoarthritis.  Depression Depression with previous prescription for citalopram  not filled. Discussed importance of medication adherence for managing depression symptoms. - Send prescription for citalopram  to [facility].         Return in about 3 weeks (around 04/14/2024).   Mandy Second, PA

## 2024-03-24 NOTE — Patient Instructions (Addendum)
 Increase your Semglee  to 25 units. Do 25 units for 5 days. Then, increase your semglee  by 3 units, every 5 days until fasting glucose goals are achieved. Example: 4/28 - 5/2 - take 25 units 5/3 - 5/7 - take 28 units 5/8 - 5/12 - take 31 units  Continue to increase by 3 units every 5 days until fasting glucose levels of 120-140 achieved, at which case you will no longer increase   Please use the above scale to determine your novolog  dose prior to meals.   I have refilled all your other medications. I have placed a referral to both podiatry and dental.  Please go to [facility] to get your medications and an xray of your right knee.  Return for recheck in 3 weeks.

## 2024-03-25 ENCOUNTER — Other Ambulatory Visit (HOSPITAL_BASED_OUTPATIENT_CLINIC_OR_DEPARTMENT_OTHER): Payer: Self-pay

## 2024-03-26 ENCOUNTER — Other Ambulatory Visit (HOSPITAL_BASED_OUTPATIENT_CLINIC_OR_DEPARTMENT_OTHER): Payer: Self-pay

## 2024-03-27 ENCOUNTER — Other Ambulatory Visit (HOSPITAL_BASED_OUTPATIENT_CLINIC_OR_DEPARTMENT_OTHER): Payer: Self-pay

## 2024-03-27 ENCOUNTER — Other Ambulatory Visit: Payer: Self-pay

## 2024-03-27 DIAGNOSIS — M1711 Unilateral primary osteoarthritis, right knee: Secondary | ICD-10-CM

## 2024-03-28 ENCOUNTER — Other Ambulatory Visit: Payer: Self-pay | Admitting: Urgent Care

## 2024-03-28 DIAGNOSIS — Z794 Long term (current) use of insulin: Secondary | ICD-10-CM

## 2024-03-28 DIAGNOSIS — K219 Gastro-esophageal reflux disease without esophagitis: Secondary | ICD-10-CM

## 2024-03-28 NOTE — Telephone Encounter (Signed)
 Copied from CRM [id number]. Topic: Clinical - Medication Refill >> Mar 28, 2024  2:51 PM Allyne Areola wrote: Most Recent Primary Care Visit:  Provider: CRAIN, WHITNEY L  Department: [facility]  Visit Type: OFFICE VISIT  Date: 03/24/2024  Medication: amLODipine  (NORVASC ) 5 MG tablet,thiamine  (VITAMIN B1) 100 MG tablet,pantoprazole  (PROTONIX ) 40 MG tablet,clopidogrel  (PLAVIX ) 75 MG tablet ,carvedilol  (COREG ) 3.125 MG tablet,insulin  aspart (NOVOLOG ) 100 UNIT/ML FlexPen  Has the patient contacted their pharmacy? No, she is currently at a therapy facility and needs these medications filled. (Agent: If no, request that the patient contact the pharmacy for the refill. If patient does not wish to contact the pharmacy document the reason why and proceed with request.) (Agent: If yes, when and what did the pharmacy advise?)  Is this the correct pharmacy for this prescription? Yes If no, delete pharmacy and type the correct one.  This is the patient's preferred pharmacy:   [facility] - [place], [place] - [address] [phone] [address] [place] [address]     Has the prescription been filled recently? No  Is the patient out of the medication? Yes  Has the patient been seen for an appointment in the last year OR does the patient have an upcoming appointment? Yes  Can we respond through MyChart? Yes  Agent: Please be advised that Rx refills may take up to 3 business days. We ask that you follow-up with your pharmacy.

## 2024-03-31 ENCOUNTER — Other Ambulatory Visit (HOSPITAL_BASED_OUTPATIENT_CLINIC_OR_DEPARTMENT_OTHER): Payer: Self-pay

## 2024-03-31 ENCOUNTER — Other Ambulatory Visit (HOSPITAL_COMMUNITY): Payer: Self-pay

## 2024-03-31 ENCOUNTER — Other Ambulatory Visit: Payer: Self-pay | Admitting: Urgent Care

## 2024-03-31 DIAGNOSIS — E1165 Type 2 diabetes mellitus with hyperglycemia: Secondary | ICD-10-CM

## 2024-03-31 NOTE — Telephone Encounter (Signed)
 Copied from CRM [id number]. Topic: Clinical - Medication Refill >> Mar 31, 2024  2:29 PM Earnestine Goes B wrote: Most Recent Primary Care Visit:  Provider: CRAIN, WHITNEY L  Department: [facility]  Visit Type: OFFICE VISIT  Date: 03/24/2024  Medication: insulin  aspart (NOVOLOG ) 100 UNIT/ML FlexPen  Has the patient contacted their pharmacy? Yes (Agent: If no, request that the patient contact the pharmacy for the refill. If patient does not wish to contact the pharmacy document the reason why and proceed with request.) (Agent: If yes, when and what did the pharmacy advise?)  Is this the correct pharmacy for this prescription? Yes If no, delete pharmacy and type the correct one.  This is the patient's preferred pharmacy:  [facility] - [place], [place] - [address] [place] [address] Phone: [phone] Fax: [phone]  Has the prescription been filled recently? Yes  Is the patient out of the medication? Yes  Has the patient been seen for an appointment in the last year OR does the patient have an upcoming appointment? Yes  Can we respond through MyChart? Yes  Agent: Please be advised that Rx refills may take up to 3 business days. We ask that you follow-up with your pharmacy.

## 2024-04-01 ENCOUNTER — Other Ambulatory Visit (HOSPITAL_BASED_OUTPATIENT_CLINIC_OR_DEPARTMENT_OTHER): Payer: Self-pay

## 2024-04-02 ENCOUNTER — Other Ambulatory Visit (HOSPITAL_BASED_OUTPATIENT_CLINIC_OR_DEPARTMENT_OTHER): Payer: Self-pay

## 2024-04-07 ENCOUNTER — Other Ambulatory Visit (HOSPITAL_BASED_OUTPATIENT_CLINIC_OR_DEPARTMENT_OTHER): Payer: Self-pay

## 2024-04-08 ENCOUNTER — Other Ambulatory Visit: Payer: Self-pay

## 2024-04-08 ENCOUNTER — Other Ambulatory Visit (HOSPITAL_BASED_OUTPATIENT_CLINIC_OR_DEPARTMENT_OTHER): Payer: Self-pay

## 2024-04-09 ENCOUNTER — Other Ambulatory Visit (HOSPITAL_BASED_OUTPATIENT_CLINIC_OR_DEPARTMENT_OTHER): Payer: Self-pay

## 2024-04-10 ENCOUNTER — Other Ambulatory Visit (HOSPITAL_BASED_OUTPATIENT_CLINIC_OR_DEPARTMENT_OTHER): Payer: Self-pay

## 2024-04-10 ENCOUNTER — Telehealth: Payer: Self-pay | Admitting: Pharmacist

## 2024-04-10 NOTE — Telephone Encounter (Signed)
 Called to see if patient's son had received application or Novo Nordisk medication assistance program that I faxed 03/12/2024. Novo Nordisk if approved could provide long acting inslulin - [name] which would replace her Semglee  and short acting insulin  Novlog. They also provide pen needles.   Unable to reach patient's son. It looks like patient might be in rehab at [facility] in [facility].  LM on VM with CB# [phone]

## 2024-04-11 ENCOUNTER — Other Ambulatory Visit (HOSPITAL_BASED_OUTPATIENT_CLINIC_OR_DEPARTMENT_OTHER): Payer: Self-pay

## 2024-04-14 ENCOUNTER — Other Ambulatory Visit (HOSPITAL_BASED_OUTPATIENT_CLINIC_OR_DEPARTMENT_OTHER): Payer: Self-pay

## 2024-04-14 ENCOUNTER — Ambulatory Visit: Admitting: Urgent Care

## 2024-04-15 ENCOUNTER — Other Ambulatory Visit (HOSPITAL_BASED_OUTPATIENT_CLINIC_OR_DEPARTMENT_OTHER): Payer: Self-pay

## 2024-04-16 ENCOUNTER — Other Ambulatory Visit (HOSPITAL_BASED_OUTPATIENT_CLINIC_OR_DEPARTMENT_OTHER): Payer: Self-pay

## 2024-04-17 ENCOUNTER — Other Ambulatory Visit (HOSPITAL_BASED_OUTPATIENT_CLINIC_OR_DEPARTMENT_OTHER): Payer: Self-pay

## 2024-04-18 ENCOUNTER — Other Ambulatory Visit (HOSPITAL_BASED_OUTPATIENT_CLINIC_OR_DEPARTMENT_OTHER): Payer: Self-pay

## 2024-04-22 ENCOUNTER — Other Ambulatory Visit (HOSPITAL_BASED_OUTPATIENT_CLINIC_OR_DEPARTMENT_OTHER): Payer: Self-pay

## 2024-04-22 ENCOUNTER — Ambulatory Visit: Admitting: Urgent Care

## 2024-04-22 ENCOUNTER — Telehealth: Payer: Self-pay

## 2024-04-22 NOTE — Telephone Encounter (Signed)
 Please advise Copied from CRM [id number]. Topic: Clinical - Medication Question >> Apr 22, 2024 12:30 PM Chuck Crater wrote: Reason for CRM: Crystal with [facility] is calling because patient needs a Accu check and needs a prescription for it sent to [facility] [place] [address]

## 2024-04-23 ENCOUNTER — Other Ambulatory Visit (HOSPITAL_BASED_OUTPATIENT_CLINIC_OR_DEPARTMENT_OTHER): Payer: Self-pay

## 2024-04-24 ENCOUNTER — Other Ambulatory Visit: Payer: Self-pay | Admitting: Urgent Care

## 2024-04-24 ENCOUNTER — Other Ambulatory Visit (HOSPITAL_BASED_OUTPATIENT_CLINIC_OR_DEPARTMENT_OTHER): Payer: Self-pay

## 2024-04-24 DIAGNOSIS — E1165 Type 2 diabetes mellitus with hyperglycemia: Secondary | ICD-10-CM

## 2024-04-24 MED ORDER — ACCU-CHEK SOFTCLIX LANCETS MISC
3 refills | Status: AC
Start: 2024-04-24 — End: ?
  Filled 2024-04-24 (×3): qty 100, 30d supply, fill #0

## 2024-04-24 MED ORDER — BLOOD GLUCOSE TEST VI STRP
1.0000 | ORAL_STRIP | Freq: Three times a day (TID) | 3 refills | Status: AC
Start: 2024-04-24 — End: 2024-08-22
  Filled 2024-04-24 (×2): qty 100, 30d supply, fill #0

## 2024-04-24 MED ORDER — BLOOD GLUCOSE MONITOR SYSTEM W/DEVICE KIT
PACK | 0 refills | Status: DC
Start: 2024-04-24 — End: 2024-04-25
  Filled 2024-04-24 (×3): qty 1, 30d supply, fill #0

## 2024-04-24 MED ORDER — LANCET DEVICE MISC
0 refills | Status: AC
Start: 2024-04-24 — End: ?
  Filled 2024-04-24: qty 1, fill #0

## 2024-04-24 NOTE — Telephone Encounter (Signed)
 Spoke to pts son. He said pt graduated from her rehab program and is now at a long term treatment center in [place]. He will call back later to give an updated pharmacy to send her meter. He said pt may be there for 3-4 months

## 2024-04-24 NOTE — Telephone Encounter (Signed)
 Actually it looks like it was sent to [facility]..I did not see [facility] on epic?

## 2024-04-24 NOTE — Telephone Encounter (Signed)
 Waiting to hear back from pts son to get more details

## 2024-04-24 NOTE — Telephone Encounter (Signed)
 I have sent this in. We need to verify that patient will be following up soon as she has missed her last two appointments. Is she still in rehab?

## 2024-04-24 NOTE — Telephone Encounter (Signed)
 Im assuming then that this treatment center has their own medical care? AKA she will be under the care of someone else for the next 3-4 months? With her chronic conditions, she will need to be seen frequently... please advise

## 2024-04-25 ENCOUNTER — Telehealth: Payer: Self-pay | Admitting: Urgent Care

## 2024-04-25 ENCOUNTER — Other Ambulatory Visit: Payer: Self-pay

## 2024-04-25 ENCOUNTER — Telehealth: Payer: Self-pay

## 2024-04-25 DIAGNOSIS — E1165 Type 2 diabetes mellitus with hyperglycemia: Secondary | ICD-10-CM

## 2024-04-25 MED ORDER — BLOOD GLUCOSE MONITOR SYSTEM W/DEVICE KIT: PACK | 0 refills | Status: AC

## 2024-04-25 MED ORDER — INSULIN ASPART 100 UNIT/ML FLEXPEN
3.0000 [IU] | PEN_INJECTOR | Freq: Three times a day (TID) | SUBCUTANEOUS | 1 refills | Status: DC
Start: 2024-04-25 — End: 2024-04-28

## 2024-04-25 NOTE — Telephone Encounter (Signed)
 Copied from CRM [id number]. Topic: Clinical - Medication Refill >> Apr 25, 2024 10:33 AM Alyse July wrote: Medication: Blood Glucose Monitoring Suppl (BLOOD GLUCOSE MONITOR SYSTEM) w/Device KIT,Glucose Blood (BLOOD GLUCOSE TEST STRIPS) STRP,Lancet Device MISC, Accu-Chek Softclix Lancets lancets   Has the patient contacted their pharmacy? Yes  This is the patient's preferred pharmacy:   [facility] - [place], [place] - [address] [employer] [address] [place] [address] Phone: [phone] Fax: [phone]  Is this the correct pharmacy for this prescription? Yes If no, delete pharmacy and type the correct one.   Has the prescription been filled recently? Yes  Is the patient out of the medication? Yes  Has the patient been seen for an appointment in the last year OR does the patient have an upcoming appointment? Yes  Can we respond through MyChart? No  Agent: Please be advised that Rx refills may take up to 3 business days. We ask that you follow-up with your pharmacy.

## 2024-04-25 NOTE — Telephone Encounter (Signed)
 Pt will have a PCP while in the facility but she is in need of a refill on some meds due to her not bringing them with her. Updated pharmacy is [facility] [address], [address]

## 2024-04-25 NOTE — Telephone Encounter (Signed)
 Copied from CRM [id number]. Topic: Clinical - Medication Question >> Apr 25, 2024  4:16 PM Baldo Levan wrote: Reason for CRM: Patients case manager called in requesting an update on the patients prescriptions being sent to [facility]. She stated the patient is completely out and needs this today. Please contact Patrice at [phone] with an update or questions.

## 2024-04-28 ENCOUNTER — Other Ambulatory Visit: Payer: Self-pay

## 2024-04-28 ENCOUNTER — Other Ambulatory Visit (HOSPITAL_BASED_OUTPATIENT_CLINIC_OR_DEPARTMENT_OTHER): Payer: Self-pay

## 2024-04-28 ENCOUNTER — Other Ambulatory Visit: Payer: Self-pay | Admitting: Urgent Care

## 2024-04-28 DIAGNOSIS — K219 Gastro-esophageal reflux disease without esophagitis: Secondary | ICD-10-CM

## 2024-04-28 DIAGNOSIS — F332 Major depressive disorder, recurrent severe without psychotic features: Secondary | ICD-10-CM

## 2024-04-28 DIAGNOSIS — E1165 Type 2 diabetes mellitus with hyperglycemia: Secondary | ICD-10-CM

## 2024-04-28 MED ORDER — CARVEDILOL 3.125 MG PO TABS: 3.1250 mg | ORAL_TABLET | Freq: Two times a day (BID) | ORAL | 0 refills | Status: AC

## 2024-04-28 MED ORDER — SIMVASTATIN 40 MG PO TABS: 40.0000 mg | ORAL_TABLET | Freq: Every evening | ORAL | 0 refills | Status: AC

## 2024-04-28 MED ORDER — PANTOPRAZOLE SODIUM 40 MG PO TBEC: 40.0000 mg | DELAYED_RELEASE_TABLET | Freq: Every day | ORAL | 0 refills | Status: DC

## 2024-04-28 MED ORDER — AMLODIPINE BESYLATE 5 MG PO TABS: 5.0000 mg | ORAL_TABLET | Freq: Every day | ORAL | 0 refills | Status: AC

## 2024-04-28 MED ORDER — CITALOPRAM HYDROBROMIDE 40 MG PO TABS
40.0000 mg | ORAL_TABLET | Freq: Every day | ORAL | 0 refills | Status: AC
Start: 2024-04-28 — End: ?

## 2024-04-28 MED ORDER — THIAMINE HCL 100 MG PO TABS: 100.0000 mg | ORAL_TABLET | Freq: Every day | ORAL | 0 refills | Status: AC

## 2024-04-28 MED ORDER — CLOPIDOGREL BISULFATE 75 MG PO TABS: 75.0000 mg | ORAL_TABLET | Freq: Every day | ORAL | 0 refills | Status: AC

## 2024-04-28 MED ORDER — CITALOPRAM HYDROBROMIDE 40 MG PO TABS: 40.0000 mg | ORAL_TABLET | Freq: Every day | ORAL | 0 refills | Status: DC

## 2024-04-28 MED ORDER — PANTOPRAZOLE SODIUM 40 MG PO TBEC: 40.0000 mg | DELAYED_RELEASE_TABLET | Freq: Every day | ORAL | 0 refills | Status: AC

## 2024-04-28 MED ORDER — INSULIN GLARGINE-YFGN 100 UNIT/ML ~~LOC~~ SOPN: 20.0000 [IU] | PEN_INJECTOR | Freq: Every day | SUBCUTANEOUS | 0 refills | Status: AC

## 2024-04-28 MED ORDER — INSULIN ASPART 100 UNIT/ML FLEXPEN
3.0000 [IU] | PEN_INJECTOR | Freq: Three times a day (TID) | SUBCUTANEOUS | 0 refills | Status: AC
Start: 2024-04-28 — End: ?

## 2024-04-28 NOTE — Telephone Encounter (Signed)
 I have called in the medications on her profile. I have only called in one month since I will no longer be her PCP.

## 2024-04-28 NOTE — Telephone Encounter (Signed)
 No further action needed.

## 2024-04-28 NOTE — Progress Notes (Signed)
 Pt is now living in a facility on [address]. She was supposed to bring her medications with her, which some of them she did, others she did not. She has run out and needs refills until the facility physician can see her.

## 2024-04-29 ENCOUNTER — Telehealth: Payer: Self-pay | Admitting: Pharmacy Technician

## 2024-04-29 ENCOUNTER — Other Ambulatory Visit (HOSPITAL_COMMUNITY): Payer: Self-pay

## 2024-04-29 DIAGNOSIS — Z79899 Other long term (current) drug therapy: Secondary | ICD-10-CM | POA: Diagnosis not present

## 2024-04-29 NOTE — Telephone Encounter (Signed)
 Pharmacy Patient Advocate Encounter   Received notification from Onbase that prior authorization for NovoLOG  FlexPen 100UNIT/ML pen-injectors is required/requested.   Insurance verification completed.   The patient is insured through [employer] .   Per test claim: The current 140 day co-pay is, $105.00.  No PA needed at this time. This test claim [facility]- copay amounts may vary at other pharmacies due to pharmacy/plan contracts, or as the patient moves through the different stages of their insurance plan.

## 2024-04-30 ENCOUNTER — Telehealth: Payer: Self-pay

## 2024-04-30 NOTE — Telephone Encounter (Signed)
 Copied from CRM [id number]. Topic: Medical Record Request - Other >> Apr 25, 2024 10:36 AM Alyse July wrote: Reason for CRM: Patient request her current medication list be faxed to her dental provider Dr. Abigail Abler [phone] attention to Dr. Abigail Abler.

## 2024-05-05 ENCOUNTER — Telehealth: Payer: Self-pay | Admitting: Pharmacist

## 2024-05-05 ENCOUNTER — Ambulatory Visit: Admitting: Urgent Care

## 2024-05-05 NOTE — Telephone Encounter (Signed)
 Called to see if patient had been able to get Novolog  filled. Our office received a message that Novolog  needed prior authorization but per our prior authorization team the $105 was correct because patient's dose is so low - the 15mL pack will last her 90+ days. She is likely being charged $35 x 3 or $35/month which is the $105 / 90 days.   [facility] where her prescriptions were sent but per their pharmacist, her prescriptions were transferred out to [facility] on [address] in [place].  Spoke with pharmacist at [facility] and they state that prescriptions were transferred out to [facility] in [place], [place]. However [facility] reported they had no profile for this patient and not prescriptions for her.    LM on VM of Mrs. Bring's son Austine Lefort, phone. Explained cost of Novolog  and life my CB# if he has questions. [phone]

## 2024-05-08 DIAGNOSIS — Z79899 Other long term (current) drug therapy: Secondary | ICD-10-CM | POA: Diagnosis not present

## 2024-05-09 DIAGNOSIS — Z7984 Long term (current) use of oral hypoglycemic drugs: Secondary | ICD-10-CM | POA: Diagnosis not present

## 2024-05-09 DIAGNOSIS — I499 Cardiac arrhythmia, unspecified: Secondary | ICD-10-CM | POA: Diagnosis not present

## 2024-05-09 DIAGNOSIS — R071 Chest pain on breathing: Secondary | ICD-10-CM | POA: Diagnosis not present

## 2024-05-09 DIAGNOSIS — I10 Essential (primary) hypertension: Secondary | ICD-10-CM | POA: Diagnosis not present

## 2024-05-09 DIAGNOSIS — F17200 Nicotine dependence, unspecified, uncomplicated: Secondary | ICD-10-CM | POA: Diagnosis not present

## 2024-05-09 DIAGNOSIS — E1151 Type 2 diabetes mellitus with diabetic peripheral angiopathy without gangrene: Secondary | ICD-10-CM | POA: Diagnosis not present

## 2024-05-09 DIAGNOSIS — I2510 Atherosclerotic heart disease of native coronary artery without angina pectoris: Secondary | ICD-10-CM | POA: Diagnosis not present

## 2024-05-09 DIAGNOSIS — R079 Chest pain, unspecified: Secondary | ICD-10-CM | POA: Diagnosis not present

## 2024-05-09 DIAGNOSIS — R0602 Shortness of breath: Secondary | ICD-10-CM | POA: Diagnosis not present

## 2024-05-09 DIAGNOSIS — R0600 Dyspnea, unspecified: Secondary | ICD-10-CM | POA: Diagnosis not present

## 2024-05-09 DIAGNOSIS — Z0389 Encounter for observation for other suspected diseases and conditions ruled out: Secondary | ICD-10-CM | POA: Diagnosis not present

## 2024-05-09 DIAGNOSIS — R0789 Other chest pain: Secondary | ICD-10-CM | POA: Diagnosis not present

## 2024-05-09 DIAGNOSIS — Z91148 Patient's other noncompliance with medication regimen for other reason: Secondary | ICD-10-CM | POA: Diagnosis not present

## 2024-05-09 DIAGNOSIS — K429 Umbilical hernia without obstruction or gangrene: Secondary | ICD-10-CM | POA: Diagnosis not present

## 2024-05-09 DIAGNOSIS — E11649 Type 2 diabetes mellitus with hypoglycemia without coma: Secondary | ICD-10-CM | POA: Diagnosis not present

## 2024-05-09 DIAGNOSIS — R918 Other nonspecific abnormal finding of lung field: Secondary | ICD-10-CM | POA: Diagnosis not present

## 2024-05-09 DIAGNOSIS — E1142 Type 2 diabetes mellitus with diabetic polyneuropathy: Secondary | ICD-10-CM | POA: Diagnosis not present

## 2024-05-09 DIAGNOSIS — Z7902 Long term (current) use of antithrombotics/antiplatelets: Secondary | ICD-10-CM | POA: Diagnosis not present

## 2024-05-09 DIAGNOSIS — R0902 Hypoxemia: Secondary | ICD-10-CM | POA: Diagnosis not present

## 2024-05-09 DIAGNOSIS — Z955 Presence of coronary angioplasty implant and graft: Secondary | ICD-10-CM | POA: Diagnosis not present

## 2024-05-09 DIAGNOSIS — Z79899 Other long term (current) drug therapy: Secondary | ICD-10-CM | POA: Diagnosis not present

## 2024-05-09 DIAGNOSIS — E1165 Type 2 diabetes mellitus with hyperglycemia: Secondary | ICD-10-CM | POA: Diagnosis not present

## 2024-05-09 DIAGNOSIS — J441 Chronic obstructive pulmonary disease with (acute) exacerbation: Secondary | ICD-10-CM | POA: Diagnosis not present

## 2024-05-09 DIAGNOSIS — K5730 Diverticulosis of large intestine without perforation or abscess without bleeding: Secondary | ICD-10-CM | POA: Diagnosis not present

## 2024-05-09 DIAGNOSIS — Z7982 Long term (current) use of aspirin: Secondary | ICD-10-CM | POA: Diagnosis not present

## 2024-05-09 DIAGNOSIS — Z794 Long term (current) use of insulin: Secondary | ICD-10-CM | POA: Diagnosis not present

## 2024-05-09 DIAGNOSIS — Z20822 Contact with and (suspected) exposure to covid-19: Secondary | ICD-10-CM | POA: Diagnosis not present

## 2024-05-09 DIAGNOSIS — T380X5A Adverse effect of glucocorticoids and synthetic analogues, initial encounter: Secondary | ICD-10-CM | POA: Diagnosis not present

## 2024-05-09 DIAGNOSIS — R739 Hyperglycemia, unspecified: Secondary | ICD-10-CM | POA: Diagnosis not present

## 2024-05-10 DIAGNOSIS — E1165 Type 2 diabetes mellitus with hyperglycemia: Secondary | ICD-10-CM | POA: Diagnosis not present

## 2024-05-10 DIAGNOSIS — Z794 Long term (current) use of insulin: Secondary | ICD-10-CM | POA: Diagnosis not present

## 2024-05-15 DIAGNOSIS — I10 Essential (primary) hypertension: Secondary | ICD-10-CM | POA: Diagnosis not present

## 2024-05-15 DIAGNOSIS — K219 Gastro-esophageal reflux disease without esophagitis: Secondary | ICD-10-CM | POA: Diagnosis not present

## 2024-05-15 DIAGNOSIS — Z7189 Other specified counseling: Secondary | ICD-10-CM | POA: Diagnosis not present

## 2024-05-15 DIAGNOSIS — J441 Chronic obstructive pulmonary disease with (acute) exacerbation: Secondary | ICD-10-CM | POA: Diagnosis not present

## 2024-05-15 DIAGNOSIS — E1165 Type 2 diabetes mellitus with hyperglycemia: Secondary | ICD-10-CM | POA: Diagnosis not present

## 2024-05-15 DIAGNOSIS — F5101 Primary insomnia: Secondary | ICD-10-CM | POA: Diagnosis not present

## 2024-05-15 DIAGNOSIS — I25708 Atherosclerosis of coronary artery bypass graft(s), unspecified, with other forms of angina pectoris: Secondary | ICD-10-CM | POA: Diagnosis not present

## 2024-05-23 DIAGNOSIS — Z79899 Other long term (current) drug therapy: Secondary | ICD-10-CM | POA: Diagnosis not present

## 2024-05-27 DIAGNOSIS — Z978 Presence of other specified devices: Secondary | ICD-10-CM | POA: Diagnosis not present

## 2024-05-27 DIAGNOSIS — Z87891 Personal history of nicotine dependence: Secondary | ICD-10-CM | POA: Diagnosis not present

## 2024-05-27 DIAGNOSIS — E119 Type 2 diabetes mellitus without complications: Secondary | ICD-10-CM | POA: Diagnosis not present

## 2024-05-27 DIAGNOSIS — Z79899 Other long term (current) drug therapy: Secondary | ICD-10-CM | POA: Diagnosis not present

## 2024-05-27 DIAGNOSIS — Z794 Long term (current) use of insulin: Secondary | ICD-10-CM | POA: Diagnosis not present

## 2024-06-04 ENCOUNTER — Telehealth: Payer: Self-pay

## 2024-06-04 DIAGNOSIS — M170 Bilateral primary osteoarthritis of knee: Secondary | ICD-10-CM | POA: Diagnosis not present

## 2024-06-04 DIAGNOSIS — M79604 Pain in right leg: Secondary | ICD-10-CM | POA: Diagnosis not present

## 2024-06-04 DIAGNOSIS — M79605 Pain in left leg: Secondary | ICD-10-CM | POA: Diagnosis not present

## 2024-06-04 DIAGNOSIS — M545 Low back pain, unspecified: Secondary | ICD-10-CM | POA: Diagnosis not present

## 2024-06-04 DIAGNOSIS — Z7189 Other specified counseling: Secondary | ICD-10-CM | POA: Diagnosis not present

## 2024-06-04 NOTE — Telephone Encounter (Signed)
 No she shouldn't be. She moved to an assisted living or something like that in [place] I believe. A physician who works at the home she is living in should be taking care of all her medical needs.

## 2024-06-04 NOTE — Telephone Encounter (Signed)
 Is Diana Santos still a patient of yours?   She is falling into care gaps.

## 2024-06-05 NOTE — Telephone Encounter (Signed)
 I called and her son answered the phone. He is unsure if she has another provider. He will call us  back.

## 2024-06-09 DIAGNOSIS — Z79899 Other long term (current) drug therapy: Secondary | ICD-10-CM | POA: Diagnosis not present

## 2024-06-11 DIAGNOSIS — Z79899 Other long term (current) drug therapy: Secondary | ICD-10-CM | POA: Diagnosis not present

## 2024-06-18 DIAGNOSIS — Z79899 Other long term (current) drug therapy: Secondary | ICD-10-CM | POA: Diagnosis not present

## 2024-06-20 DIAGNOSIS — M79605 Pain in left leg: Secondary | ICD-10-CM | POA: Diagnosis not present

## 2024-06-20 DIAGNOSIS — M545 Low back pain, unspecified: Secondary | ICD-10-CM | POA: Diagnosis not present

## 2024-06-20 DIAGNOSIS — M79604 Pain in right leg: Secondary | ICD-10-CM | POA: Diagnosis not present

## 2024-06-20 DIAGNOSIS — M170 Bilateral primary osteoarthritis of knee: Secondary | ICD-10-CM | POA: Diagnosis not present

## 2024-06-20 DIAGNOSIS — Z7189 Other specified counseling: Secondary | ICD-10-CM | POA: Diagnosis not present

## 2024-06-20 DIAGNOSIS — E538 Deficiency of other specified B group vitamins: Secondary | ICD-10-CM | POA: Diagnosis not present

## 2024-06-23 DIAGNOSIS — Z79899 Other long term (current) drug therapy: Secondary | ICD-10-CM | POA: Diagnosis not present

## 2024-06-25 DIAGNOSIS — Z79899 Other long term (current) drug therapy: Secondary | ICD-10-CM | POA: Diagnosis not present

## 2024-06-26 DIAGNOSIS — K219 Gastro-esophageal reflux disease without esophagitis: Secondary | ICD-10-CM | POA: Diagnosis not present

## 2024-06-26 DIAGNOSIS — I10 Essential (primary) hypertension: Secondary | ICD-10-CM | POA: Diagnosis not present

## 2024-06-26 DIAGNOSIS — E1165 Type 2 diabetes mellitus with hyperglycemia: Secondary | ICD-10-CM | POA: Diagnosis not present

## 2024-06-30 DIAGNOSIS — Z79899 Other long term (current) drug therapy: Secondary | ICD-10-CM | POA: Diagnosis not present

## 2024-07-02 DIAGNOSIS — Z79899 Other long term (current) drug therapy: Secondary | ICD-10-CM | POA: Diagnosis not present

## 2024-07-08 DIAGNOSIS — M170 Bilateral primary osteoarthritis of knee: Secondary | ICD-10-CM | POA: Diagnosis not present

## 2024-07-08 DIAGNOSIS — Z7189 Other specified counseling: Secondary | ICD-10-CM | POA: Diagnosis not present

## 2024-07-08 DIAGNOSIS — E559 Vitamin D deficiency, unspecified: Secondary | ICD-10-CM | POA: Diagnosis not present

## 2024-07-08 DIAGNOSIS — E1165 Type 2 diabetes mellitus with hyperglycemia: Secondary | ICD-10-CM | POA: Diagnosis not present

## 2024-07-09 DIAGNOSIS — Z79899 Other long term (current) drug therapy: Secondary | ICD-10-CM | POA: Diagnosis not present

## 2024-07-10 DIAGNOSIS — I10 Essential (primary) hypertension: Secondary | ICD-10-CM | POA: Diagnosis not present

## 2024-07-10 DIAGNOSIS — Z794 Long term (current) use of insulin: Secondary | ICD-10-CM | POA: Diagnosis not present

## 2024-07-10 DIAGNOSIS — E1165 Type 2 diabetes mellitus with hyperglycemia: Secondary | ICD-10-CM | POA: Diagnosis not present

## 2024-07-16 DIAGNOSIS — Z79899 Other long term (current) drug therapy: Secondary | ICD-10-CM | POA: Diagnosis not present

## 2024-07-18 DIAGNOSIS — Z79899 Other long term (current) drug therapy: Secondary | ICD-10-CM | POA: Diagnosis not present

## 2024-07-18 DIAGNOSIS — K219 Gastro-esophageal reflux disease without esophagitis: Secondary | ICD-10-CM | POA: Diagnosis not present

## 2024-07-18 DIAGNOSIS — R079 Chest pain, unspecified: Secondary | ICD-10-CM | POA: Diagnosis not present

## 2024-07-18 DIAGNOSIS — I444 Left anterior fascicular block: Secondary | ICD-10-CM | POA: Diagnosis not present

## 2024-07-18 DIAGNOSIS — R112 Nausea with vomiting, unspecified: Secondary | ICD-10-CM | POA: Diagnosis not present

## 2024-07-18 DIAGNOSIS — R9431 Abnormal electrocardiogram [ECG] [EKG]: Secondary | ICD-10-CM | POA: Diagnosis not present

## 2024-07-19 DIAGNOSIS — R079 Chest pain, unspecified: Secondary | ICD-10-CM | POA: Diagnosis not present

## 2024-07-24 DIAGNOSIS — Z79899 Other long term (current) drug therapy: Secondary | ICD-10-CM | POA: Diagnosis not present

## 2024-12-04 ENCOUNTER — Other Ambulatory Visit: Payer: Self-pay

## 2024-12-08 ENCOUNTER — Other Ambulatory Visit: Payer: Self-pay
